# Patient Record
Sex: Female | Born: 1935
Health system: Southern US, Community
[De-identification: ages and names within clinical notes are randomized; demographics above are authoritative.]

## PROBLEM LIST (undated history)

## (undated) DIAGNOSIS — D649 Anemia, unspecified: Secondary | ICD-10-CM

## (undated) DIAGNOSIS — L309 Dermatitis, unspecified: Secondary | ICD-10-CM

## (undated) DIAGNOSIS — E785 Hyperlipidemia, unspecified: Secondary | ICD-10-CM

## (undated) DIAGNOSIS — I10 Essential (primary) hypertension: Secondary | ICD-10-CM

## (undated) DIAGNOSIS — M069 Rheumatoid arthritis, unspecified: Secondary | ICD-10-CM

## (undated) DIAGNOSIS — M1990 Unspecified osteoarthritis, unspecified site: Secondary | ICD-10-CM

## (undated) DIAGNOSIS — N289 Disorder of kidney and ureter, unspecified: Secondary | ICD-10-CM

## (undated) DIAGNOSIS — E119 Type 2 diabetes mellitus without complications: Secondary | ICD-10-CM

---

## 1998-01-20 ENCOUNTER — Other Ambulatory Visit: Admission: RE | Admit: 1998-01-20 | Discharge: 1998-01-20 | Payer: Self-pay | Admitting: Obstetrics and Gynecology

## 1998-02-03 ENCOUNTER — Other Ambulatory Visit: Admission: RE | Admit: 1998-02-03 | Discharge: 1998-02-03 | Payer: Self-pay | Admitting: Obstetrics and Gynecology

## 1999-01-22 ENCOUNTER — Other Ambulatory Visit: Admission: RE | Admit: 1999-01-22 | Discharge: 1999-01-22 | Payer: Self-pay | Admitting: Obstetrics and Gynecology

## 2000-02-12 ENCOUNTER — Other Ambulatory Visit: Admission: RE | Admit: 2000-02-12 | Discharge: 2000-02-12 | Payer: Self-pay | Admitting: Obstetrics and Gynecology

## 2001-01-20 ENCOUNTER — Emergency Department (HOSPITAL_COMMUNITY): Admission: EM | Admit: 2001-01-20 | Discharge: 2001-01-21 | Payer: Self-pay | Admitting: Emergency Medicine

## 2001-02-16 ENCOUNTER — Other Ambulatory Visit: Admission: RE | Admit: 2001-02-16 | Discharge: 2001-02-16 | Payer: Self-pay | Admitting: Obstetrics and Gynecology

## 2002-02-23 ENCOUNTER — Other Ambulatory Visit: Admission: RE | Admit: 2002-02-23 | Discharge: 2002-02-23 | Payer: Self-pay | Admitting: Obstetrics and Gynecology

## 2003-04-05 ENCOUNTER — Other Ambulatory Visit: Admission: RE | Admit: 2003-04-05 | Discharge: 2003-04-05 | Payer: Self-pay | Admitting: Obstetrics and Gynecology

## 2005-11-17 ENCOUNTER — Emergency Department (HOSPITAL_COMMUNITY): Admission: EM | Admit: 2005-11-17 | Discharge: 2005-11-17 | Payer: Self-pay | Admitting: Emergency Medicine

## 2005-11-20 ENCOUNTER — Encounter: Admission: RE | Admit: 2005-11-20 | Discharge: 2005-11-20 | Payer: Self-pay | Admitting: Internal Medicine

## 2005-12-17 ENCOUNTER — Encounter: Admission: RE | Admit: 2005-12-17 | Discharge: 2005-12-17 | Payer: Self-pay | Admitting: Interventional Radiology

## 2005-12-26 ENCOUNTER — Encounter: Admission: RE | Admit: 2005-12-26 | Discharge: 2005-12-26 | Payer: Self-pay | Admitting: Interventional Radiology

## 2006-01-16 ENCOUNTER — Encounter: Admission: RE | Admit: 2006-01-16 | Discharge: 2006-01-16 | Payer: Self-pay | Admitting: Interventional Radiology

## 2006-07-03 ENCOUNTER — Encounter: Admission: RE | Admit: 2006-07-03 | Discharge: 2006-07-03 | Payer: Self-pay | Admitting: Interventional Radiology

## 2006-12-24 ENCOUNTER — Encounter: Admission: RE | Admit: 2006-12-24 | Discharge: 2006-12-24 | Payer: Self-pay | Admitting: Interventional Radiology

## 2010-04-08 ENCOUNTER — Encounter: Payer: Self-pay | Admitting: Oncology

## 2011-04-15 DIAGNOSIS — Z961 Presence of intraocular lens: Secondary | ICD-10-CM | POA: Diagnosis not present

## 2011-04-15 DIAGNOSIS — E119 Type 2 diabetes mellitus without complications: Secondary | ICD-10-CM | POA: Diagnosis not present

## 2011-07-15 DIAGNOSIS — M1990 Unspecified osteoarthritis, unspecified site: Secondary | ICD-10-CM | POA: Diagnosis not present

## 2011-07-15 DIAGNOSIS — I10 Essential (primary) hypertension: Secondary | ICD-10-CM | POA: Diagnosis not present

## 2011-07-15 DIAGNOSIS — E785 Hyperlipidemia, unspecified: Secondary | ICD-10-CM | POA: Diagnosis not present

## 2011-07-15 DIAGNOSIS — E119 Type 2 diabetes mellitus without complications: Secondary | ICD-10-CM | POA: Diagnosis not present

## 2011-11-06 DIAGNOSIS — I10 Essential (primary) hypertension: Secondary | ICD-10-CM | POA: Diagnosis not present

## 2011-11-06 DIAGNOSIS — E119 Type 2 diabetes mellitus without complications: Secondary | ICD-10-CM | POA: Diagnosis not present

## 2011-11-06 DIAGNOSIS — M1990 Unspecified osteoarthritis, unspecified site: Secondary | ICD-10-CM | POA: Diagnosis not present

## 2011-11-06 DIAGNOSIS — E785 Hyperlipidemia, unspecified: Secondary | ICD-10-CM | POA: Diagnosis not present

## 2011-11-21 DIAGNOSIS — D3130 Benign neoplasm of unspecified choroid: Secondary | ICD-10-CM | POA: Diagnosis not present

## 2011-11-21 DIAGNOSIS — E11319 Type 2 diabetes mellitus with unspecified diabetic retinopathy without macular edema: Secondary | ICD-10-CM | POA: Diagnosis not present

## 2011-11-21 DIAGNOSIS — E1139 Type 2 diabetes mellitus with other diabetic ophthalmic complication: Secondary | ICD-10-CM | POA: Diagnosis not present

## 2012-02-25 DIAGNOSIS — I10 Essential (primary) hypertension: Secondary | ICD-10-CM | POA: Diagnosis not present

## 2012-02-25 DIAGNOSIS — R82998 Other abnormal findings in urine: Secondary | ICD-10-CM | POA: Diagnosis not present

## 2012-02-25 DIAGNOSIS — E119 Type 2 diabetes mellitus without complications: Secondary | ICD-10-CM | POA: Diagnosis not present

## 2012-02-25 DIAGNOSIS — E785 Hyperlipidemia, unspecified: Secondary | ICD-10-CM | POA: Diagnosis not present

## 2012-03-03 DIAGNOSIS — Z23 Encounter for immunization: Secondary | ICD-10-CM | POA: Diagnosis not present

## 2012-03-03 DIAGNOSIS — I10 Essential (primary) hypertension: Secondary | ICD-10-CM | POA: Diagnosis not present

## 2012-03-03 DIAGNOSIS — Z0000 Encounter for general adult medical examination without abnormal findings: Secondary | ICD-10-CM | POA: Diagnosis not present

## 2012-03-03 DIAGNOSIS — E119 Type 2 diabetes mellitus without complications: Secondary | ICD-10-CM | POA: Diagnosis not present

## 2012-03-03 DIAGNOSIS — E785 Hyperlipidemia, unspecified: Secondary | ICD-10-CM | POA: Diagnosis not present

## 2012-03-05 DIAGNOSIS — Z1212 Encounter for screening for malignant neoplasm of rectum: Secondary | ICD-10-CM | POA: Diagnosis not present

## 2012-04-15 DIAGNOSIS — E1139 Type 2 diabetes mellitus with other diabetic ophthalmic complication: Secondary | ICD-10-CM | POA: Diagnosis not present

## 2012-04-15 DIAGNOSIS — D3130 Benign neoplasm of unspecified choroid: Secondary | ICD-10-CM | POA: Diagnosis not present

## 2012-04-15 DIAGNOSIS — H179 Unspecified corneal scar and opacity: Secondary | ICD-10-CM | POA: Diagnosis not present

## 2012-04-15 DIAGNOSIS — E11319 Type 2 diabetes mellitus with unspecified diabetic retinopathy without macular edema: Secondary | ICD-10-CM | POA: Diagnosis not present

## 2012-07-16 DIAGNOSIS — I10 Essential (primary) hypertension: Secondary | ICD-10-CM | POA: Diagnosis not present

## 2012-07-16 DIAGNOSIS — Z1331 Encounter for screening for depression: Secondary | ICD-10-CM | POA: Diagnosis not present

## 2012-07-16 DIAGNOSIS — IMO0002 Reserved for concepts with insufficient information to code with codable children: Secondary | ICD-10-CM | POA: Diagnosis not present

## 2012-07-16 DIAGNOSIS — M1990 Unspecified osteoarthritis, unspecified site: Secondary | ICD-10-CM | POA: Diagnosis not present

## 2012-07-16 DIAGNOSIS — E119 Type 2 diabetes mellitus without complications: Secondary | ICD-10-CM | POA: Diagnosis not present

## 2012-07-16 DIAGNOSIS — E785 Hyperlipidemia, unspecified: Secondary | ICD-10-CM | POA: Diagnosis not present

## 2012-08-27 DIAGNOSIS — H5310 Unspecified subjective visual disturbances: Secondary | ICD-10-CM | POA: Diagnosis not present

## 2012-08-27 DIAGNOSIS — K0501 Acute gingivitis, non-plaque induced: Secondary | ICD-10-CM | POA: Diagnosis not present

## 2012-08-27 DIAGNOSIS — H2640 Unspecified secondary cataract: Secondary | ICD-10-CM | POA: Diagnosis not present

## 2012-08-27 DIAGNOSIS — G501 Atypical facial pain: Secondary | ICD-10-CM | POA: Diagnosis not present

## 2012-08-27 DIAGNOSIS — R6884 Jaw pain: Secondary | ICD-10-CM | POA: Diagnosis not present

## 2012-11-05 DIAGNOSIS — H26499 Other secondary cataract, unspecified eye: Secondary | ICD-10-CM | POA: Diagnosis not present

## 2012-11-05 DIAGNOSIS — H2640 Unspecified secondary cataract: Secondary | ICD-10-CM | POA: Diagnosis not present

## 2012-11-26 DIAGNOSIS — D3130 Benign neoplasm of unspecified choroid: Secondary | ICD-10-CM | POA: Diagnosis not present

## 2012-11-26 DIAGNOSIS — E11319 Type 2 diabetes mellitus with unspecified diabetic retinopathy without macular edema: Secondary | ICD-10-CM | POA: Diagnosis not present

## 2012-11-26 DIAGNOSIS — E1139 Type 2 diabetes mellitus with other diabetic ophthalmic complication: Secondary | ICD-10-CM | POA: Diagnosis not present

## 2012-12-04 DIAGNOSIS — I10 Essential (primary) hypertension: Secondary | ICD-10-CM | POA: Diagnosis not present

## 2012-12-04 DIAGNOSIS — D649 Anemia, unspecified: Secondary | ICD-10-CM | POA: Diagnosis not present

## 2012-12-04 DIAGNOSIS — E119 Type 2 diabetes mellitus without complications: Secondary | ICD-10-CM | POA: Diagnosis not present

## 2012-12-04 DIAGNOSIS — Z23 Encounter for immunization: Secondary | ICD-10-CM | POA: Diagnosis not present

## 2012-12-04 DIAGNOSIS — E785 Hyperlipidemia, unspecified: Secondary | ICD-10-CM | POA: Diagnosis not present

## 2012-12-04 DIAGNOSIS — M1990 Unspecified osteoarthritis, unspecified site: Secondary | ICD-10-CM | POA: Diagnosis not present

## 2012-12-04 DIAGNOSIS — IMO0002 Reserved for concepts with insufficient information to code with codable children: Secondary | ICD-10-CM | POA: Diagnosis not present

## 2013-03-02 DIAGNOSIS — D649 Anemia, unspecified: Secondary | ICD-10-CM | POA: Diagnosis not present

## 2013-03-02 DIAGNOSIS — E785 Hyperlipidemia, unspecified: Secondary | ICD-10-CM | POA: Diagnosis not present

## 2013-03-02 DIAGNOSIS — E1169 Type 2 diabetes mellitus with other specified complication: Secondary | ICD-10-CM | POA: Diagnosis not present

## 2013-03-02 DIAGNOSIS — R809 Proteinuria, unspecified: Secondary | ICD-10-CM | POA: Diagnosis not present

## 2013-03-08 DIAGNOSIS — I10 Essential (primary) hypertension: Secondary | ICD-10-CM | POA: Diagnosis not present

## 2013-03-08 DIAGNOSIS — IMO0002 Reserved for concepts with insufficient information to code with codable children: Secondary | ICD-10-CM | POA: Diagnosis not present

## 2013-03-08 DIAGNOSIS — E785 Hyperlipidemia, unspecified: Secondary | ICD-10-CM | POA: Diagnosis not present

## 2013-03-08 DIAGNOSIS — M1990 Unspecified osteoarthritis, unspecified site: Secondary | ICD-10-CM | POA: Diagnosis not present

## 2013-03-08 DIAGNOSIS — Z0000 Encounter for general adult medical examination without abnormal findings: Secondary | ICD-10-CM | POA: Diagnosis not present

## 2013-03-08 DIAGNOSIS — E119 Type 2 diabetes mellitus without complications: Secondary | ICD-10-CM | POA: Diagnosis not present

## 2013-03-19 DIAGNOSIS — Z1212 Encounter for screening for malignant neoplasm of rectum: Secondary | ICD-10-CM | POA: Diagnosis not present

## 2013-04-19 DIAGNOSIS — H02059 Trichiasis without entropian unspecified eye, unspecified eyelid: Secondary | ICD-10-CM | POA: Diagnosis not present

## 2013-04-19 DIAGNOSIS — E11319 Type 2 diabetes mellitus with unspecified diabetic retinopathy without macular edema: Secondary | ICD-10-CM | POA: Diagnosis not present

## 2013-04-19 DIAGNOSIS — E1139 Type 2 diabetes mellitus with other diabetic ophthalmic complication: Secondary | ICD-10-CM | POA: Diagnosis not present

## 2013-04-19 DIAGNOSIS — D3130 Benign neoplasm of unspecified choroid: Secondary | ICD-10-CM | POA: Diagnosis not present

## 2013-07-21 DIAGNOSIS — I10 Essential (primary) hypertension: Secondary | ICD-10-CM | POA: Diagnosis not present

## 2013-07-21 DIAGNOSIS — Z1331 Encounter for screening for depression: Secondary | ICD-10-CM | POA: Diagnosis not present

## 2013-07-21 DIAGNOSIS — E785 Hyperlipidemia, unspecified: Secondary | ICD-10-CM | POA: Diagnosis not present

## 2013-07-21 DIAGNOSIS — IMO0002 Reserved for concepts with insufficient information to code with codable children: Secondary | ICD-10-CM | POA: Diagnosis not present

## 2013-07-21 DIAGNOSIS — E119 Type 2 diabetes mellitus without complications: Secondary | ICD-10-CM | POA: Diagnosis not present

## 2013-10-25 DIAGNOSIS — E119 Type 2 diabetes mellitus without complications: Secondary | ICD-10-CM | POA: Diagnosis not present

## 2013-10-25 DIAGNOSIS — IMO0002 Reserved for concepts with insufficient information to code with codable children: Secondary | ICD-10-CM | POA: Diagnosis not present

## 2013-11-23 DIAGNOSIS — D3130 Benign neoplasm of unspecified choroid: Secondary | ICD-10-CM | POA: Diagnosis not present

## 2013-11-23 DIAGNOSIS — E1139 Type 2 diabetes mellitus with other diabetic ophthalmic complication: Secondary | ICD-10-CM | POA: Diagnosis not present

## 2014-03-14 DIAGNOSIS — E785 Hyperlipidemia, unspecified: Secondary | ICD-10-CM | POA: Diagnosis not present

## 2014-03-14 DIAGNOSIS — E119 Type 2 diabetes mellitus without complications: Secondary | ICD-10-CM | POA: Diagnosis not present

## 2014-03-14 DIAGNOSIS — R8290 Unspecified abnormal findings in urine: Secondary | ICD-10-CM | POA: Diagnosis not present

## 2014-03-14 DIAGNOSIS — R8299 Other abnormal findings in urine: Secondary | ICD-10-CM | POA: Diagnosis not present

## 2014-03-14 DIAGNOSIS — I10 Essential (primary) hypertension: Secondary | ICD-10-CM | POA: Diagnosis not present

## 2014-03-21 DIAGNOSIS — E119 Type 2 diabetes mellitus without complications: Secondary | ICD-10-CM | POA: Diagnosis not present

## 2014-03-21 DIAGNOSIS — E785 Hyperlipidemia, unspecified: Secondary | ICD-10-CM | POA: Diagnosis not present

## 2014-03-21 DIAGNOSIS — Z6822 Body mass index (BMI) 22.0-22.9, adult: Secondary | ICD-10-CM | POA: Diagnosis not present

## 2014-03-21 DIAGNOSIS — Z23 Encounter for immunization: Secondary | ICD-10-CM | POA: Diagnosis not present

## 2014-03-21 DIAGNOSIS — D649 Anemia, unspecified: Secondary | ICD-10-CM | POA: Diagnosis not present

## 2014-03-21 DIAGNOSIS — I10 Essential (primary) hypertension: Secondary | ICD-10-CM | POA: Diagnosis not present

## 2014-03-21 DIAGNOSIS — M1990 Unspecified osteoarthritis, unspecified site: Secondary | ICD-10-CM | POA: Diagnosis not present

## 2014-03-21 DIAGNOSIS — Z0000 Encounter for general adult medical examination without abnormal findings: Secondary | ICD-10-CM | POA: Diagnosis not present

## 2014-03-22 DIAGNOSIS — Z1212 Encounter for screening for malignant neoplasm of rectum: Secondary | ICD-10-CM | POA: Diagnosis not present

## 2014-04-20 DIAGNOSIS — H02052 Trichiasis without entropian right lower eyelid: Secondary | ICD-10-CM | POA: Diagnosis not present

## 2014-04-20 DIAGNOSIS — H02055 Trichiasis without entropian left lower eyelid: Secondary | ICD-10-CM | POA: Diagnosis not present

## 2014-04-20 DIAGNOSIS — E11329 Type 2 diabetes mellitus with mild nonproliferative diabetic retinopathy without macular edema: Secondary | ICD-10-CM | POA: Diagnosis not present

## 2014-04-20 DIAGNOSIS — D3131 Benign neoplasm of right choroid: Secondary | ICD-10-CM | POA: Diagnosis not present

## 2014-06-22 DIAGNOSIS — I10 Essential (primary) hypertension: Secondary | ICD-10-CM | POA: Diagnosis not present

## 2014-06-22 DIAGNOSIS — E119 Type 2 diabetes mellitus without complications: Secondary | ICD-10-CM | POA: Diagnosis not present

## 2014-07-20 DIAGNOSIS — M8588 Other specified disorders of bone density and structure, other site: Secondary | ICD-10-CM | POA: Diagnosis not present

## 2014-07-20 DIAGNOSIS — Z6822 Body mass index (BMI) 22.0-22.9, adult: Secondary | ICD-10-CM | POA: Diagnosis not present

## 2014-07-20 DIAGNOSIS — Z1231 Encounter for screening mammogram for malignant neoplasm of breast: Secondary | ICD-10-CM | POA: Diagnosis not present

## 2014-07-20 DIAGNOSIS — Z124 Encounter for screening for malignant neoplasm of cervix: Secondary | ICD-10-CM | POA: Diagnosis not present

## 2014-07-20 DIAGNOSIS — N958 Other specified menopausal and perimenopausal disorders: Secondary | ICD-10-CM | POA: Diagnosis not present

## 2014-09-30 DIAGNOSIS — M1990 Unspecified osteoarthritis, unspecified site: Secondary | ICD-10-CM | POA: Diagnosis not present

## 2014-09-30 DIAGNOSIS — Z6821 Body mass index (BMI) 21.0-21.9, adult: Secondary | ICD-10-CM | POA: Diagnosis not present

## 2014-09-30 DIAGNOSIS — Z1389 Encounter for screening for other disorder: Secondary | ICD-10-CM | POA: Diagnosis not present

## 2014-09-30 DIAGNOSIS — E119 Type 2 diabetes mellitus without complications: Secondary | ICD-10-CM | POA: Diagnosis not present

## 2014-09-30 DIAGNOSIS — D649 Anemia, unspecified: Secondary | ICD-10-CM | POA: Diagnosis not present

## 2014-09-30 DIAGNOSIS — E785 Hyperlipidemia, unspecified: Secondary | ICD-10-CM | POA: Diagnosis not present

## 2014-09-30 DIAGNOSIS — I10 Essential (primary) hypertension: Secondary | ICD-10-CM | POA: Diagnosis not present

## 2014-11-24 DIAGNOSIS — D3131 Benign neoplasm of right choroid: Secondary | ICD-10-CM | POA: Diagnosis not present

## 2014-11-24 DIAGNOSIS — E11329 Type 2 diabetes mellitus with mild nonproliferative diabetic retinopathy without macular edema: Secondary | ICD-10-CM | POA: Diagnosis not present

## 2015-01-04 DIAGNOSIS — E119 Type 2 diabetes mellitus without complications: Secondary | ICD-10-CM | POA: Diagnosis not present

## 2015-01-04 DIAGNOSIS — Z6821 Body mass index (BMI) 21.0-21.9, adult: Secondary | ICD-10-CM | POA: Diagnosis not present

## 2015-01-04 DIAGNOSIS — E785 Hyperlipidemia, unspecified: Secondary | ICD-10-CM | POA: Diagnosis not present

## 2015-01-04 DIAGNOSIS — Z23 Encounter for immunization: Secondary | ICD-10-CM | POA: Diagnosis not present

## 2015-01-04 DIAGNOSIS — I10 Essential (primary) hypertension: Secondary | ICD-10-CM | POA: Diagnosis not present

## 2015-01-04 DIAGNOSIS — D649 Anemia, unspecified: Secondary | ICD-10-CM | POA: Diagnosis not present

## 2015-01-04 DIAGNOSIS — M1990 Unspecified osteoarthritis, unspecified site: Secondary | ICD-10-CM | POA: Diagnosis not present

## 2015-03-17 DIAGNOSIS — E119 Type 2 diabetes mellitus without complications: Secondary | ICD-10-CM | POA: Diagnosis not present

## 2015-03-17 DIAGNOSIS — E784 Other hyperlipidemia: Secondary | ICD-10-CM | POA: Diagnosis not present

## 2015-03-17 DIAGNOSIS — R8290 Unspecified abnormal findings in urine: Secondary | ICD-10-CM | POA: Diagnosis not present

## 2015-03-17 DIAGNOSIS — N390 Urinary tract infection, site not specified: Secondary | ICD-10-CM | POA: Diagnosis not present

## 2015-03-31 DIAGNOSIS — E784 Other hyperlipidemia: Secondary | ICD-10-CM | POA: Diagnosis not present

## 2015-03-31 DIAGNOSIS — I129 Hypertensive chronic kidney disease with stage 1 through stage 4 chronic kidney disease, or unspecified chronic kidney disease: Secondary | ICD-10-CM | POA: Diagnosis not present

## 2015-03-31 DIAGNOSIS — Z0000 Encounter for general adult medical examination without abnormal findings: Secondary | ICD-10-CM | POA: Diagnosis not present

## 2015-03-31 DIAGNOSIS — M1990 Unspecified osteoarthritis, unspecified site: Secondary | ICD-10-CM | POA: Diagnosis not present

## 2015-03-31 DIAGNOSIS — D649 Anemia, unspecified: Secondary | ICD-10-CM | POA: Diagnosis not present

## 2015-03-31 DIAGNOSIS — Z1389 Encounter for screening for other disorder: Secondary | ICD-10-CM | POA: Diagnosis not present

## 2015-03-31 DIAGNOSIS — I10 Essential (primary) hypertension: Secondary | ICD-10-CM | POA: Diagnosis not present

## 2015-03-31 DIAGNOSIS — E119 Type 2 diabetes mellitus without complications: Secondary | ICD-10-CM | POA: Diagnosis not present

## 2015-03-31 DIAGNOSIS — L309 Dermatitis, unspecified: Secondary | ICD-10-CM | POA: Diagnosis not present

## 2015-03-31 DIAGNOSIS — N183 Chronic kidney disease, stage 3 (moderate): Secondary | ICD-10-CM | POA: Diagnosis not present

## 2015-03-31 DIAGNOSIS — Z6822 Body mass index (BMI) 22.0-22.9, adult: Secondary | ICD-10-CM | POA: Diagnosis not present

## 2015-04-26 DIAGNOSIS — H02055 Trichiasis without entropian left lower eyelid: Secondary | ICD-10-CM | POA: Diagnosis not present

## 2015-04-26 DIAGNOSIS — E113293 Type 2 diabetes mellitus with mild nonproliferative diabetic retinopathy without macular edema, bilateral: Secondary | ICD-10-CM | POA: Diagnosis not present

## 2015-04-26 DIAGNOSIS — D3131 Benign neoplasm of right choroid: Secondary | ICD-10-CM | POA: Diagnosis not present

## 2015-04-26 DIAGNOSIS — H02052 Trichiasis without entropian right lower eyelid: Secondary | ICD-10-CM | POA: Diagnosis not present

## 2015-08-24 DIAGNOSIS — L089 Local infection of the skin and subcutaneous tissue, unspecified: Secondary | ICD-10-CM | POA: Diagnosis not present

## 2015-08-24 DIAGNOSIS — M25511 Pain in right shoulder: Secondary | ICD-10-CM | POA: Diagnosis not present

## 2015-08-24 DIAGNOSIS — M25512 Pain in left shoulder: Secondary | ICD-10-CM | POA: Diagnosis not present

## 2015-08-24 DIAGNOSIS — E119 Type 2 diabetes mellitus without complications: Secondary | ICD-10-CM | POA: Diagnosis not present

## 2015-08-24 DIAGNOSIS — Z6822 Body mass index (BMI) 22.0-22.9, adult: Secondary | ICD-10-CM | POA: Diagnosis not present

## 2015-08-24 DIAGNOSIS — M859 Disorder of bone density and structure, unspecified: Secondary | ICD-10-CM | POA: Diagnosis not present

## 2015-09-01 DIAGNOSIS — I10 Essential (primary) hypertension: Secondary | ICD-10-CM | POA: Diagnosis not present

## 2015-09-01 DIAGNOSIS — Z6822 Body mass index (BMI) 22.0-22.9, adult: Secondary | ICD-10-CM | POA: Diagnosis not present

## 2015-09-01 DIAGNOSIS — M353 Polymyalgia rheumatica: Secondary | ICD-10-CM | POA: Diagnosis not present

## 2015-09-01 DIAGNOSIS — M25519 Pain in unspecified shoulder: Secondary | ICD-10-CM | POA: Diagnosis not present

## 2015-09-28 DIAGNOSIS — M25519 Pain in unspecified shoulder: Secondary | ICD-10-CM | POA: Diagnosis not present

## 2015-09-28 DIAGNOSIS — D649 Anemia, unspecified: Secondary | ICD-10-CM | POA: Diagnosis not present

## 2015-09-28 DIAGNOSIS — M1990 Unspecified osteoarthritis, unspecified site: Secondary | ICD-10-CM | POA: Diagnosis not present

## 2015-09-28 DIAGNOSIS — I129 Hypertensive chronic kidney disease with stage 1 through stage 4 chronic kidney disease, or unspecified chronic kidney disease: Secondary | ICD-10-CM | POA: Diagnosis not present

## 2015-09-28 DIAGNOSIS — M353 Polymyalgia rheumatica: Secondary | ICD-10-CM | POA: Diagnosis not present

## 2015-09-28 DIAGNOSIS — E119 Type 2 diabetes mellitus without complications: Secondary | ICD-10-CM | POA: Diagnosis not present

## 2015-09-28 DIAGNOSIS — I10 Essential (primary) hypertension: Secondary | ICD-10-CM | POA: Diagnosis not present

## 2015-09-28 DIAGNOSIS — L309 Dermatitis, unspecified: Secondary | ICD-10-CM | POA: Diagnosis not present

## 2015-09-28 DIAGNOSIS — E784 Other hyperlipidemia: Secondary | ICD-10-CM | POA: Diagnosis not present

## 2015-09-28 DIAGNOSIS — M859 Disorder of bone density and structure, unspecified: Secondary | ICD-10-CM | POA: Diagnosis not present

## 2015-09-28 DIAGNOSIS — Z6823 Body mass index (BMI) 23.0-23.9, adult: Secondary | ICD-10-CM | POA: Diagnosis not present

## 2015-09-28 DIAGNOSIS — N183 Chronic kidney disease, stage 3 (moderate): Secondary | ICD-10-CM | POA: Diagnosis not present

## 2015-10-22 ENCOUNTER — Encounter (HOSPITAL_COMMUNITY): Payer: Self-pay | Admitting: Emergency Medicine

## 2015-10-22 ENCOUNTER — Ambulatory Visit (HOSPITAL_COMMUNITY)
Admission: EM | Admit: 2015-10-22 | Discharge: 2015-10-22 | Disposition: A | Discharge status: Home / Self Care | Payer: Medicare Other | Attending: Emergency Medicine | Admitting: Emergency Medicine

## 2015-10-22 DIAGNOSIS — S0083XA Contusion of other part of head, initial encounter: Secondary | ICD-10-CM

## 2015-10-22 DIAGNOSIS — T148XXA Other injury of unspecified body region, initial encounter: Secondary | ICD-10-CM

## 2015-10-22 DIAGNOSIS — S01111A Laceration without foreign body of right eyelid and periocular area, initial encounter: Secondary | ICD-10-CM

## 2015-10-22 DIAGNOSIS — T148 Other injury of unspecified body region: Secondary | ICD-10-CM

## 2015-10-22 DIAGNOSIS — S0012XA Contusion of left eyelid and periocular area, initial encounter: Secondary | ICD-10-CM

## 2015-10-22 HISTORY — DX: Essential (primary) hypertension: I10

## 2015-10-22 HISTORY — DX: Type 2 diabetes mellitus without complications: E11.9

## 2015-10-22 HISTORY — DX: Disorder of kidney and ureter, unspecified: N28.9

## 2015-10-22 HISTORY — DX: Dermatitis, unspecified: L30.9

## 2015-10-22 HISTORY — DX: Anemia, unspecified: D64.9

## 2015-10-22 HISTORY — DX: Hyperlipidemia, unspecified: E78.5

## 2015-10-22 HISTORY — DX: Unspecified osteoarthritis, unspecified site: M19.90

## 2015-10-22 NOTE — ED Triage Notes (Signed)
The patient presented to the [facility] with a complaint of a laceration above her right eye, left arm pain and right knee pain secondary to a fall that occurred today. The patient stated that she tripped and fell in her bedroom. She denied any LOC.

## 2015-10-22 NOTE — ED Provider Notes (Signed)
CSN: [id number]     Arrival date & time 10/22/15  1259 History   First MD Initiated Contact with Patient 10/22/15 1405     Chief Complaint  Patient presents with  . Fall   (Consider location/radiation/quality/duration/timing/severity/associated sxs/prior Treatment) HPI 80 y/o female was trying to move her cat when she slipped wearing her bedroom slippers on a hard wood floor. She struck the floor, sustaining a laceration above her right eyebrow. bleeding was controlled with pressure. There was no LOC,  Past Medical History:  Diagnosis Date  . Anemia   . Diabetes mellitus without complication ([place])   . Eczema   . Hyperlipidemia   . Hypertension   . Osteoarthritis   . Renal disorder    History reviewed. No pertinent surgical history. History reviewed. No pertinent family history. Social History  Substance Use Topics  . Smoking status: Never Smoker  . Smokeless tobacco: Never Used  . Alcohol use No   OB History    No data available     Review of Systems  Denies: HEADACHE, NAUSEA, ABDOMINAL PAIN, CHEST PAIN, CONGESTION, DYSURIA, SHORTNESS OF BREATH  Allergies  Review of patient's allergies indicates no known allergies.  Home Medications   Prior to Admission medications   Medication Sig Start Date End Date Taking? Authorizing Provider  atorvastatin (LIPITOR) 20 MG tablet Take 20 mg by mouth daily.   Yes Historical Provider, MD  cholecalciferol (VITAMIN D) 1000 units tablet Take 1,000 Units by mouth daily.   Yes Historical Provider, MD  glyBURIDE-metformin (GLUCOVANCE) 2.5-500 MG tablet Take 1 tablet by mouth daily with breakfast.   Yes Historical Provider, MD  lisinopril (PRINIVIL,ZESTRIL) 10 MG tablet Take 10 mg by mouth daily.   Yes Historical Provider, MD  Multiple Vitamin (MULTIVITAMIN) capsule Take 1 capsule by mouth daily.   Yes Historical Provider, MD  naproxen (NAPROSYN) 250 MG tablet Take by mouth 2 (two) times daily with a meal.   Yes Historical Provider, MD    Meds Ordered and Administered this Visit  Medications - No data to display  BP 146/59 (BP Location: Right Arm)   Pulse 91   Temp 98.3 F (36.8 C) (Oral)   SpO2 94%  No data found.   Physical Exam NURSES NOTES AND VITAL SIGNS REVIEWED. CONSTITUTIONAL: Well developed, well nourished, no acute distress HEENT: normocephalic, atraumatic EYES: Conjunctiva normal contusion over right eye and small laceration on the eyebrow.  NECK:normal ROM, supple, no adenopathy PULMONARY:No respiratory distress, normal effort ABDOMINAL: Soft, ND, NT BS+, No CVAT MUSCULOSKELETAL: Normal ROM of all extremities,  SKIN: warm and dry without rash PSYCHIATRIC: Mood and affect, behavior are normal  Urgent Care Course   Clinical Course    .Marland KitchenLaceration Repair Date/Time: 10/22/2015 2:15 PM Performed by: Konrad Felix Authorized by: Melony Overly   Consent:    Consent obtained:  Verbal   Consent given by:  Patient Anesthesia (see MAR for exact dosages):    Anesthesia method:  None Laceration details:    Location:  Face   Face location:  R eyebrow   Length (cm):  1 Repair type:    Repair type:  Simple Treatment:    Area cleansed with:  Shur-Clens   Amount of cleaning:  Standard   Visualized foreign bodies/material removed: no   Approximation:    Approximation:  Close   Vermilion border: well-aligned   Post-procedure details:    Dressing:  Non-adherent dressing   Patient tolerance of procedure:  Tolerated well, no immediate complications     (  including critical care time)  Labs Review Labs Reviewed - No data to display  Imaging Review No results found.   Visual Acuity Review  Right Eye Distance:   Left Eye Distance:   Bilateral Distance:    Right Eye Near:   Left Eye Near:    Bilateral Near:         MDM   1. Eyebrow laceration, right, initial encounter   2. Contusion   3. Contusion of face, initial encounter   4. Black eye, left, initial encounter     Patient  is reassured that there are no issues that require transfer to higher level of care at this time or additional tests. Patient is advised to continue home symptomatic treatment. Patient is advised that if there are new or worsening symptoms to attend the emergency department, contact primary care provider, or return to UC. Instructions of care provided discharged home in stable condition.    THIS NOTE WAS GENERATED USING A VOICE RECOGNITION SOFTWARE PROGRAM. ALL REASONABLE EFFORTS  WERE MADE TO PROOFREAD THIS DOCUMENT FOR ACCURACY.  I have verbally reviewed the discharge instructions with the patient. A printed AVS was given to the patient.  All questions were answered prior to discharge.      Konrad Felix, PA 10/22/15 2102

## 2015-10-23 DIAGNOSIS — M2550 Pain in unspecified joint: Secondary | ICD-10-CM | POA: Diagnosis not present

## 2015-10-23 DIAGNOSIS — M25511 Pain in right shoulder: Secondary | ICD-10-CM | POA: Diagnosis not present

## 2015-10-23 DIAGNOSIS — M25512 Pain in left shoulder: Secondary | ICD-10-CM | POA: Diagnosis not present

## 2015-10-23 DIAGNOSIS — R5383 Other fatigue: Secondary | ICD-10-CM | POA: Diagnosis not present

## 2015-10-23 DIAGNOSIS — R700 Elevated erythrocyte sedimentation rate: Secondary | ICD-10-CM | POA: Diagnosis not present

## 2015-10-26 DIAGNOSIS — M353 Polymyalgia rheumatica: Secondary | ICD-10-CM | POA: Diagnosis not present

## 2015-10-26 DIAGNOSIS — Z6822 Body mass index (BMI) 22.0-22.9, adult: Secondary | ICD-10-CM | POA: Diagnosis not present

## 2015-10-26 DIAGNOSIS — E119 Type 2 diabetes mellitus without complications: Secondary | ICD-10-CM | POA: Diagnosis not present

## 2015-10-26 DIAGNOSIS — R600 Localized edema: Secondary | ICD-10-CM | POA: Diagnosis not present

## 2015-10-26 DIAGNOSIS — R2681 Unsteadiness on feet: Secondary | ICD-10-CM | POA: Diagnosis not present

## 2015-10-30 DIAGNOSIS — Z6822 Body mass index (BMI) 22.0-22.9, adult: Secondary | ICD-10-CM | POA: Diagnosis not present

## 2015-10-30 DIAGNOSIS — D6489 Other specified anemias: Secondary | ICD-10-CM | POA: Diagnosis not present

## 2015-10-30 DIAGNOSIS — R5383 Other fatigue: Secondary | ICD-10-CM | POA: Diagnosis not present

## 2015-10-30 DIAGNOSIS — E119 Type 2 diabetes mellitus without complications: Secondary | ICD-10-CM | POA: Diagnosis not present

## 2015-10-30 DIAGNOSIS — I10 Essential (primary) hypertension: Secondary | ICD-10-CM | POA: Diagnosis not present

## 2015-11-03 ENCOUNTER — Encounter (HOSPITAL_COMMUNITY): Payer: Self-pay | Admitting: *Deleted

## 2015-11-03 ENCOUNTER — Ambulatory Visit (HOSPITAL_COMMUNITY)
Admission: EM | Admit: 2015-11-03 | Discharge: 2015-11-03 | Disposition: A | Discharge status: Home / Self Care | Payer: Medicare Other | Attending: Internal Medicine | Admitting: Internal Medicine

## 2015-11-03 DIAGNOSIS — R739 Hyperglycemia, unspecified: Secondary | ICD-10-CM

## 2015-11-03 DIAGNOSIS — L03119 Cellulitis of unspecified part of limb: Secondary | ICD-10-CM | POA: Diagnosis not present

## 2015-11-03 LAB — POCT I-STAT, CHEM 8
BUN: 23 mg/dL — AB (ref 6–20)
CALCIUM ION: 1.28 mmol/L — AB (ref 1.12–1.23)
Chloride: 101 mmol/L (ref 101–111)
Creatinine, Ser: 0.9 mg/dL (ref 0.44–1.00)
Glucose, Bld: 251 mg/dL — ABNORMAL HIGH (ref 65–99)
HEMATOCRIT: 26 % — AB (ref 36.0–46.0)
HEMOGLOBIN: 8.8 g/dL — AB (ref 12.0–15.0)
Potassium: 5.5 mmol/L — ABNORMAL HIGH (ref 3.5–5.1)
SODIUM: 137 mmol/L (ref 135–145)
TCO2: 26 mmol/L (ref 0–100)

## 2015-11-03 MED ORDER — CEPHALEXIN 500 MG PO CAPS: 500.0000 mg | ORAL_CAPSULE | Freq: Three times a day (TID) | ORAL | 0 refills | Status: AC

## 2015-11-03 NOTE — ED Triage Notes (Signed)
Pt  Was   Seen  9  Days  Ago  For  A  Fall  She  Has  Steri  Strips in place    She is  A  Diabetic  And  Her  Blood sugars  Have  Been up and  Down    She  States  She has   Swelling  In lower  Extremities  From   Previous  Prednisone     Treatment  She  Also  Has  Some  Redness  And  Swelling of  l  Arm    She  Has  Steri  Strips in place  Over r  Eye with  Healing ecchymosis      Present

## 2015-11-03 NOTE — ED Provider Notes (Signed)
CSN: [id number]     Arrival date & time 11/03/15  1503 History   First MD Initiated Contact with Patient 11/03/15 1554     Chief Complaint  Patient presents with  . Follow-up   (Consider location/radiation/quality/duration/timing/severity/associated sxs/prior Treatment) HPI  Heidi Moran is a 80 y.o. female presenting to UC with c/o redness, pain, and stiffness in her Left wrist that started about 2 days ago.  Pt states pain in Left hand and wrist is so bad she cannot grip her coffee cup.  She has tried extra strength tylenol with minimal relief.  She recently saw a rheumatologist but states she is still waiting to f/u at the end of the month as tests are pending.  She has been on prednisone in the past for bilateral shoulder pain and stiffness but notes her blood sugar got into the 300s so she needed to stop the prednisone and was started on insulin.  She is currently keeping tract of her daily sugars and will f/u with PCP next week to review as PCP has gradually lowered her insulin dose, however, Left wrist pain, redness and swelling is too severe to wait until next week. Pain is 8/10.  She is Right hand dominant. She questions if pricking her fingers on Left hand for blood sugar monitoring started the infection.  She does not always remember to use alcohol swabs before pricking her finger.  Denies fever, chills, n/v/d.   She was seen in UC about 9 days ago for trip and fall with laceration and contusion to her face. She notes that is doing well and gradually improving. No falls since then.   Past Medical History:  Diagnosis Date  . Anemia   . Diabetes mellitus without complication ([place])   . Eczema   . Hyperlipidemia   . Hypertension   . Osteoarthritis   . Renal disorder    History reviewed. No pertinent surgical history. History reviewed. No pertinent family history. Social History  Substance Use Topics  . Smoking status: Never Smoker  . Smokeless tobacco: Never Used  . Alcohol use  No   OB History    No data available     Review of Systems  Constitutional: Negative for chills and fever.  Musculoskeletal: Positive for arthralgias, joint swelling and myalgias.       Left wrist and hand pain and stiffness   Skin: Positive for color change and rash. Negative for wound.    Allergies  Review of patient's allergies indicates no known allergies.  Home Medications   Prior to Admission medications   Medication Sig Start Date End Date Taking? Authorizing Provider  atorvastatin (LIPITOR) 20 MG tablet Take 20 mg by mouth daily.    Historical Provider, MD  cephALEXin (KEFLEX) 500 MG capsule Take 1 capsule (500 mg total) by mouth 3 (three) times daily. 11/03/15 11/10/15  Noland Fordyce, PA-C  cholecalciferol (VITAMIN D) 1000 units tablet Take 1,000 Units by mouth daily.    Historical Provider, MD  glyBURIDE-metformin (GLUCOVANCE) 2.5-500 MG tablet Take 1 tablet by mouth daily with breakfast.    Historical Provider, MD  lisinopril (PRINIVIL,ZESTRIL) 10 MG tablet Take 10 mg by mouth daily.    Historical Provider, MD  Multiple Vitamin (MULTIVITAMIN) capsule Take 1 capsule by mouth daily.    Historical Provider, MD  naproxen (NAPROSYN) 250 MG tablet Take by mouth 2 (two) times daily with a meal.    Historical Provider, MD   Meds Ordered and Administered this Visit  Medications -  No data to display  BP 122/70 (BP Location: Left Arm)   Pulse 80   Temp 98.6 F (37 C) (Oral)   SpO2 97%  No data found.   Physical Exam  Constitutional: She is oriented to person, place, and time. She appears well-developed and well-nourished.  HENT:  Head: Normocephalic. Head is with contusion.    Well healing laceration above Right eyebrow. Steri-strip in place. Diffuse ecchymosis.   Eyes: EOM are normal.  Cardiovascular: Normal rate.   Pulses:      Radial pulses are 2+ on the left side.  Pulmonary/Chest: Effort normal.  Musculoskeletal: Normal range of motion. She exhibits tenderness.  She exhibits no edema.  Left hand: limited ROM fingers, unable to make a full fist due to stiffness and pain. Mild tenderness to dorsal lateral aspect.  Full ROM left wrist.   Neurological: She is alert and oriented to person, place, and time.  Skin: Skin is warm and dry. Capillary refill takes less than 2 seconds. There is erythema.  Left wrist: skin in tact, faint erythema to dorsal and ulnar aspect.   Psychiatric: She has a normal mood and affect. Her behavior is normal.  Nursing note and vitals reviewed.   Urgent Care Course   Clinical Course    Procedures (including critical care time)  Labs Review Labs Reviewed  POCT I-STAT, CHEM 8 - Abnormal; Notable for the following:       Result Value   Potassium 5.5 (*)    BUN 23 (*)    Glucose, Bld 251 (*)    Calcium, Ion 1.28 (*)    Hemoglobin 8.8 (*)    HCT 26.0 (*)    All other components within normal limits    Imaging Review No results found.   MDM   1. Hyperglycemia   2. Cellulitis of wrist    Pt c/o worsening Left wrist pain, redness, and stiffness. Currently being worked up by a rheumatologist, f/u visit with them later this month.  Erythema and reported worsening pain concerning for underlying cellulitis.  Rx: Keflex and warm compresses.  Glucose- 251, pt reportedly have glucose of 62 this morning, she has since has 2 english muffins, Ocean Spr[employer] juice and peanut butter sandwhich. Pt's PCP working with pt to help manage her glucose with insulin.  Glucose could be elevated to mild cellulitis in Left wrist.  Advised pt to continue to monitor her CBG at home.  Pt info for Type 2 Diabetes and Hyperglycemia provided.  Resources for [facility] and Diabetes Education management center provided. Encouraged to call Monday to request assistance with her DM. Advised to f/u with PCP Monday or Tuesday next week. If symptoms worsen this evening or this morning, call 911 or go to closest emergency department or if CBG  goes above 300 or below 60.  Patient and husband verbalized understanding and agreement with treatment plan.      Noland Fordyce, PA-C 11/03/15 1719

## 2015-11-08 DIAGNOSIS — M353 Polymyalgia rheumatica: Secondary | ICD-10-CM | POA: Diagnosis not present

## 2015-11-08 DIAGNOSIS — L309 Dermatitis, unspecified: Secondary | ICD-10-CM | POA: Diagnosis not present

## 2015-11-08 DIAGNOSIS — I129 Hypertensive chronic kidney disease with stage 1 through stage 4 chronic kidney disease, or unspecified chronic kidney disease: Secondary | ICD-10-CM | POA: Diagnosis not present

## 2015-11-08 DIAGNOSIS — D649 Anemia, unspecified: Secondary | ICD-10-CM | POA: Diagnosis not present

## 2015-11-08 DIAGNOSIS — N183 Chronic kidney disease, stage 3 (moderate): Secondary | ICD-10-CM | POA: Diagnosis not present

## 2015-11-08 DIAGNOSIS — R2681 Unsteadiness on feet: Secondary | ICD-10-CM | POA: Diagnosis not present

## 2015-11-08 DIAGNOSIS — I10 Essential (primary) hypertension: Secondary | ICD-10-CM | POA: Diagnosis not present

## 2015-11-08 DIAGNOSIS — M8580 Other specified disorders of bone density and structure, unspecified site: Secondary | ICD-10-CM | POA: Diagnosis not present

## 2015-11-08 DIAGNOSIS — R5383 Other fatigue: Secondary | ICD-10-CM | POA: Diagnosis not present

## 2015-11-08 DIAGNOSIS — L089 Local infection of the skin and subcutaneous tissue, unspecified: Secondary | ICD-10-CM | POA: Diagnosis not present

## 2015-11-08 DIAGNOSIS — M25511 Pain in right shoulder: Secondary | ICD-10-CM | POA: Diagnosis not present

## 2015-11-08 DIAGNOSIS — E119 Type 2 diabetes mellitus without complications: Secondary | ICD-10-CM | POA: Diagnosis not present

## 2015-11-14 DIAGNOSIS — M0579 Rheumatoid arthritis with rheumatoid factor of multiple sites without organ or systems involvement: Secondary | ICD-10-CM | POA: Diagnosis not present

## 2015-11-14 DIAGNOSIS — M2550 Pain in unspecified joint: Secondary | ICD-10-CM | POA: Diagnosis not present

## 2015-11-28 DIAGNOSIS — E113292 Type 2 diabetes mellitus with mild nonproliferative diabetic retinopathy without macular edema, left eye: Secondary | ICD-10-CM | POA: Diagnosis not present

## 2015-11-28 DIAGNOSIS — E113291 Type 2 diabetes mellitus with mild nonproliferative diabetic retinopathy without macular edema, right eye: Secondary | ICD-10-CM | POA: Diagnosis not present

## 2015-12-05 DIAGNOSIS — M0579 Rheumatoid arthritis with rheumatoid factor of multiple sites without organ or systems involvement: Secondary | ICD-10-CM | POA: Diagnosis not present

## 2015-12-06 DIAGNOSIS — I10 Essential (primary) hypertension: Secondary | ICD-10-CM | POA: Diagnosis not present

## 2015-12-06 DIAGNOSIS — Z6821 Body mass index (BMI) 21.0-21.9, adult: Secondary | ICD-10-CM | POA: Diagnosis not present

## 2015-12-06 DIAGNOSIS — R2681 Unsteadiness on feet: Secondary | ICD-10-CM | POA: Diagnosis not present

## 2015-12-06 DIAGNOSIS — Z23 Encounter for immunization: Secondary | ICD-10-CM | POA: Diagnosis not present

## 2015-12-06 DIAGNOSIS — M353 Polymyalgia rheumatica: Secondary | ICD-10-CM | POA: Diagnosis not present

## 2015-12-06 DIAGNOSIS — M859 Disorder of bone density and structure, unspecified: Secondary | ICD-10-CM | POA: Diagnosis not present

## 2015-12-06 DIAGNOSIS — E784 Other hyperlipidemia: Secondary | ICD-10-CM | POA: Diagnosis not present

## 2015-12-06 DIAGNOSIS — L309 Dermatitis, unspecified: Secondary | ICD-10-CM | POA: Diagnosis not present

## 2015-12-06 DIAGNOSIS — I129 Hypertensive chronic kidney disease with stage 1 through stage 4 chronic kidney disease, or unspecified chronic kidney disease: Secondary | ICD-10-CM | POA: Diagnosis not present

## 2015-12-06 DIAGNOSIS — N183 Chronic kidney disease, stage 3 (moderate): Secondary | ICD-10-CM | POA: Diagnosis not present

## 2015-12-06 DIAGNOSIS — E11319 Type 2 diabetes mellitus with unspecified diabetic retinopathy without macular edema: Secondary | ICD-10-CM | POA: Diagnosis not present

## 2016-01-02 DIAGNOSIS — M0579 Rheumatoid arthritis with rheumatoid factor of multiple sites without organ or systems involvement: Secondary | ICD-10-CM | POA: Diagnosis not present

## 2016-01-16 DIAGNOSIS — M0579 Rheumatoid arthritis with rheumatoid factor of multiple sites without organ or systems involvement: Secondary | ICD-10-CM | POA: Diagnosis not present

## 2016-01-16 DIAGNOSIS — M2550 Pain in unspecified joint: Secondary | ICD-10-CM | POA: Diagnosis not present

## 2016-01-16 DIAGNOSIS — Z79899 Other long term (current) drug therapy: Secondary | ICD-10-CM | POA: Diagnosis not present

## 2016-02-27 DIAGNOSIS — M0579 Rheumatoid arthritis with rheumatoid factor of multiple sites without organ or systems involvement: Secondary | ICD-10-CM | POA: Diagnosis not present

## 2016-03-25 DIAGNOSIS — M859 Disorder of bone density and structure, unspecified: Secondary | ICD-10-CM | POA: Diagnosis not present

## 2016-03-25 DIAGNOSIS — I10 Essential (primary) hypertension: Secondary | ICD-10-CM | POA: Diagnosis not present

## 2016-03-25 DIAGNOSIS — E784 Other hyperlipidemia: Secondary | ICD-10-CM | POA: Diagnosis not present

## 2016-03-25 DIAGNOSIS — E119 Type 2 diabetes mellitus without complications: Secondary | ICD-10-CM | POA: Diagnosis not present

## 2016-04-11 DIAGNOSIS — I129 Hypertensive chronic kidney disease with stage 1 through stage 4 chronic kidney disease, or unspecified chronic kidney disease: Secondary | ICD-10-CM | POA: Diagnosis not present

## 2016-04-11 DIAGNOSIS — E119 Type 2 diabetes mellitus without complications: Secondary | ICD-10-CM | POA: Diagnosis not present

## 2016-04-11 DIAGNOSIS — E11319 Type 2 diabetes mellitus with unspecified diabetic retinopathy without macular edema: Secondary | ICD-10-CM | POA: Diagnosis not present

## 2016-04-11 DIAGNOSIS — M353 Polymyalgia rheumatica: Secondary | ICD-10-CM | POA: Diagnosis not present

## 2016-04-11 DIAGNOSIS — E1129 Type 2 diabetes mellitus with other diabetic kidney complication: Secondary | ICD-10-CM | POA: Diagnosis not present

## 2016-04-11 DIAGNOSIS — Z0000 Encounter for general adult medical examination without abnormal findings: Secondary | ICD-10-CM | POA: Diagnosis not present

## 2016-04-11 DIAGNOSIS — I10 Essential (primary) hypertension: Secondary | ICD-10-CM | POA: Diagnosis not present

## 2016-04-11 DIAGNOSIS — Z6822 Body mass index (BMI) 22.0-22.9, adult: Secondary | ICD-10-CM | POA: Diagnosis not present

## 2016-04-11 DIAGNOSIS — R2681 Unsteadiness on feet: Secondary | ICD-10-CM | POA: Diagnosis not present

## 2016-04-11 DIAGNOSIS — Z1389 Encounter for screening for other disorder: Secondary | ICD-10-CM | POA: Diagnosis not present

## 2016-04-11 DIAGNOSIS — M859 Disorder of bone density and structure, unspecified: Secondary | ICD-10-CM | POA: Diagnosis not present

## 2016-04-11 DIAGNOSIS — N183 Chronic kidney disease, stage 3 (moderate): Secondary | ICD-10-CM | POA: Diagnosis not present

## 2016-04-16 DIAGNOSIS — Z6823 Body mass index (BMI) 23.0-23.9, adult: Secondary | ICD-10-CM | POA: Diagnosis not present

## 2016-04-16 DIAGNOSIS — M0579 Rheumatoid arthritis with rheumatoid factor of multiple sites without organ or systems involvement: Secondary | ICD-10-CM | POA: Diagnosis not present

## 2016-04-16 DIAGNOSIS — Z79899 Other long term (current) drug therapy: Secondary | ICD-10-CM | POA: Diagnosis not present

## 2016-04-16 DIAGNOSIS — M2550 Pain in unspecified joint: Secondary | ICD-10-CM | POA: Diagnosis not present

## 2016-04-23 DIAGNOSIS — M0579 Rheumatoid arthritis with rheumatoid factor of multiple sites without organ or systems involvement: Secondary | ICD-10-CM | POA: Diagnosis not present

## 2016-04-26 DIAGNOSIS — E113293 Type 2 diabetes mellitus with mild nonproliferative diabetic retinopathy without macular edema, bilateral: Secondary | ICD-10-CM | POA: Diagnosis not present

## 2016-04-26 DIAGNOSIS — H02055 Trichiasis without entropian left lower eyelid: Secondary | ICD-10-CM | POA: Diagnosis not present

## 2016-04-26 DIAGNOSIS — H5212 Myopia, left eye: Secondary | ICD-10-CM | POA: Diagnosis not present

## 2016-04-26 DIAGNOSIS — H02052 Trichiasis without entropian right lower eyelid: Secondary | ICD-10-CM | POA: Diagnosis not present

## 2016-05-01 DIAGNOSIS — Z1212 Encounter for screening for malignant neoplasm of rectum: Secondary | ICD-10-CM | POA: Diagnosis not present

## 2016-06-24 DIAGNOSIS — Z79899 Other long term (current) drug therapy: Secondary | ICD-10-CM | POA: Diagnosis not present

## 2016-06-24 DIAGNOSIS — M0579 Rheumatoid arthritis with rheumatoid factor of multiple sites without organ or systems involvement: Secondary | ICD-10-CM | POA: Diagnosis not present

## 2016-08-14 DIAGNOSIS — M2550 Pain in unspecified joint: Secondary | ICD-10-CM | POA: Diagnosis not present

## 2016-08-14 DIAGNOSIS — Z6823 Body mass index (BMI) 23.0-23.9, adult: Secondary | ICD-10-CM | POA: Diagnosis not present

## 2016-08-14 DIAGNOSIS — Z79899 Other long term (current) drug therapy: Secondary | ICD-10-CM | POA: Diagnosis not present

## 2016-08-14 DIAGNOSIS — M0579 Rheumatoid arthritis with rheumatoid factor of multiple sites without organ or systems involvement: Secondary | ICD-10-CM | POA: Diagnosis not present

## 2016-08-19 DIAGNOSIS — M0579 Rheumatoid arthritis with rheumatoid factor of multiple sites without organ or systems involvement: Secondary | ICD-10-CM | POA: Diagnosis not present

## 2016-10-14 DIAGNOSIS — M0579 Rheumatoid arthritis with rheumatoid factor of multiple sites without organ or systems involvement: Secondary | ICD-10-CM | POA: Diagnosis not present

## 2016-10-28 DIAGNOSIS — E784 Other hyperlipidemia: Secondary | ICD-10-CM | POA: Diagnosis not present

## 2016-10-28 DIAGNOSIS — M859 Disorder of bone density and structure, unspecified: Secondary | ICD-10-CM | POA: Diagnosis not present

## 2016-10-28 DIAGNOSIS — E11319 Type 2 diabetes mellitus with unspecified diabetic retinopathy without macular edema: Secondary | ICD-10-CM | POA: Diagnosis not present

## 2016-10-28 DIAGNOSIS — M069 Rheumatoid arthritis, unspecified: Secondary | ICD-10-CM | POA: Diagnosis not present

## 2016-10-28 DIAGNOSIS — Z1389 Encounter for screening for other disorder: Secondary | ICD-10-CM | POA: Diagnosis not present

## 2016-10-28 DIAGNOSIS — E1129 Type 2 diabetes mellitus with other diabetic kidney complication: Secondary | ICD-10-CM | POA: Diagnosis not present

## 2016-10-28 DIAGNOSIS — I10 Essential (primary) hypertension: Secondary | ICD-10-CM | POA: Diagnosis not present

## 2016-10-28 DIAGNOSIS — M353 Polymyalgia rheumatica: Secondary | ICD-10-CM | POA: Diagnosis not present

## 2016-10-28 DIAGNOSIS — N183 Chronic kidney disease, stage 3 (moderate): Secondary | ICD-10-CM | POA: Diagnosis not present

## 2016-10-28 DIAGNOSIS — I129 Hypertensive chronic kidney disease with stage 1 through stage 4 chronic kidney disease, or unspecified chronic kidney disease: Secondary | ICD-10-CM | POA: Diagnosis not present

## 2016-10-28 DIAGNOSIS — Z6823 Body mass index (BMI) 23.0-23.9, adult: Secondary | ICD-10-CM | POA: Diagnosis not present

## 2016-10-28 DIAGNOSIS — R2681 Unsteadiness on feet: Secondary | ICD-10-CM | POA: Diagnosis not present

## 2016-11-27 DIAGNOSIS — E113293 Type 2 diabetes mellitus with mild nonproliferative diabetic retinopathy without macular edema, bilateral: Secondary | ICD-10-CM | POA: Diagnosis not present

## 2016-11-27 DIAGNOSIS — H43813 Vitreous degeneration, bilateral: Secondary | ICD-10-CM | POA: Diagnosis not present

## 2016-11-27 DIAGNOSIS — D3131 Benign neoplasm of right choroid: Secondary | ICD-10-CM | POA: Diagnosis not present

## 2016-12-09 DIAGNOSIS — M0579 Rheumatoid arthritis with rheumatoid factor of multiple sites without organ or systems involvement: Secondary | ICD-10-CM | POA: Diagnosis not present

## 2016-12-12 DIAGNOSIS — M2550 Pain in unspecified joint: Secondary | ICD-10-CM | POA: Diagnosis not present

## 2016-12-12 DIAGNOSIS — Z6823 Body mass index (BMI) 23.0-23.9, adult: Secondary | ICD-10-CM | POA: Diagnosis not present

## 2016-12-12 DIAGNOSIS — Z79899 Other long term (current) drug therapy: Secondary | ICD-10-CM | POA: Diagnosis not present

## 2016-12-12 DIAGNOSIS — M0579 Rheumatoid arthritis with rheumatoid factor of multiple sites without organ or systems involvement: Secondary | ICD-10-CM | POA: Diagnosis not present

## 2016-12-14 LAB — GLUCOSE, POCT (MANUAL RESULT ENTRY): POC Glucose: 103 mg/dl — AB (ref 70–99)

## 2017-01-28 ENCOUNTER — Ambulatory Visit (INDEPENDENT_AMBULATORY_CARE_PROVIDER_SITE_OTHER): Payer: Medicare Other

## 2017-01-28 ENCOUNTER — Encounter (HOSPITAL_COMMUNITY): Payer: Self-pay | Admitting: Emergency Medicine

## 2017-01-28 ENCOUNTER — Ambulatory Visit (HOSPITAL_COMMUNITY)
Admission: EM | Admit: 2017-01-28 | Discharge: 2017-01-28 | Disposition: A | Discharge status: Home / Self Care | Payer: Medicare Other | Attending: Emergency Medicine | Admitting: Emergency Medicine

## 2017-01-28 DIAGNOSIS — R5383 Other fatigue: Secondary | ICD-10-CM | POA: Diagnosis not present

## 2017-01-28 DIAGNOSIS — I129 Hypertensive chronic kidney disease with stage 1 through stage 4 chronic kidney disease, or unspecified chronic kidney disease: Secondary | ICD-10-CM | POA: Diagnosis not present

## 2017-01-28 DIAGNOSIS — D649 Anemia, unspecified: Secondary | ICD-10-CM | POA: Diagnosis not present

## 2017-01-28 DIAGNOSIS — R0602 Shortness of breath: Secondary | ICD-10-CM | POA: Diagnosis not present

## 2017-01-28 DIAGNOSIS — R918 Other nonspecific abnormal finding of lung field: Secondary | ICD-10-CM | POA: Diagnosis not present

## 2017-01-28 DIAGNOSIS — Z79899 Other long term (current) drug therapy: Secondary | ICD-10-CM | POA: Diagnosis not present

## 2017-01-28 DIAGNOSIS — E1122 Type 2 diabetes mellitus with diabetic chronic kidney disease: Secondary | ICD-10-CM | POA: Diagnosis not present

## 2017-01-28 DIAGNOSIS — N183 Chronic kidney disease, stage 3 (moderate): Secondary | ICD-10-CM | POA: Diagnosis not present

## 2017-01-28 DIAGNOSIS — E785 Hyperlipidemia, unspecified: Secondary | ICD-10-CM | POA: Insufficient documentation

## 2017-01-28 LAB — POCT URINALYSIS DIP (DEVICE)
Bilirubin Urine: NEGATIVE
Glucose, UA: NEGATIVE mg/dL
Ketones, ur: NEGATIVE mg/dL
Nitrite: NEGATIVE
PROTEIN: NEGATIVE mg/dL
SPECIFIC GRAVITY, URINE: 1.015 (ref 1.005–1.030)
UROBILINOGEN UA: 0.2 mg/dL (ref 0.0–1.0)
pH: 6 (ref 5.0–8.0)

## 2017-01-28 LAB — POCT I-STAT, CHEM 8
BUN: 20 mg/dL (ref 6–20)
CALCIUM ION: 1.2 mmol/L (ref 1.15–1.40)
CHLORIDE: 103 mmol/L (ref 101–111)
Creatinine, Ser: 0.8 mg/dL (ref 0.44–1.00)
GLUCOSE: 105 mg/dL — AB (ref 65–99)
HCT: 34 % — ABNORMAL LOW (ref 36.0–46.0)
Hemoglobin: 11.6 g/dL — ABNORMAL LOW (ref 12.0–15.0)
Potassium: 5.3 mmol/L — ABNORMAL HIGH (ref 3.5–5.1)
Sodium: 139 mmol/L (ref 135–145)
TCO2: 26 mmol/L (ref 22–32)

## 2017-01-28 NOTE — ED Provider Notes (Signed)
HPI  SUBJECTIVE:  Heidi Moran is a 81 y.o. female who presents with "not feeling right" for 2 weeks.  She reports worsening fatigue, shortness of breath, decreased exercise tolerance, decreased appetite.  She is vague about how far she can go before she has to stop and rest.  She denies coughing, wheezing, chest pain, chest pressure or heaviness.  No fevers, nausea, vomiting.  No lower extremity edema, unintentional weight gain, orthopnea, PND, nocturia, abdominal pain.  She reports weight loss.  No melena, hematochezia, hematuria, epistaxis, vaginal bleeding.  No dysuria, urgency, frequency, cloudy odorous urine, hematuria.  She has not had any medication changes since August.  Symptoms are worse with exertion and better with rest.  She has not tried anything for symptoms.  She has a past medical history of rheumatoid arthritis, currently on infusions, diabetes.  She does not know what her glucose has been recently she does not check it regularly.  She has remote history of asthma.  Also chronic kidney disease, stage III, anemia.  No history of CHF, A. fib, hypertension, UTI.  SWF:UXNATFT, Delfino Lovett, MD   Past Medical History:  Diagnosis Date  . Anemia   . Diabetes mellitus without complication ([place])   . Eczema   . Hyperlipidemia   . Hypertension   . Osteoarthritis   . Renal disorder     History reviewed. No pertinent surgical history.  History reviewed. No pertinent family history.  Social History   Tobacco Use  . Smoking status: Never Smoker  . Smokeless tobacco: Never Used  Substance Use Topics  . Alcohol use: No  . Drug use: No    No current facility-administered medications for this encounter.   Current Outpatient Medications:  .  atorvastatin (LIPITOR) 20 MG tablet, Take 20 mg by mouth daily., Disp: , Rfl:  .  cholecalciferol (VITAMIN D) 1000 units tablet, Take 1,000 Units by mouth daily., Disp: , Rfl:  .  glyBURIDE-metformin (GLUCOVANCE) 2.5-500 MG tablet, Take 1 tablet  by mouth daily with breakfast., Disp: , Rfl:  .  Golimumab ([place] ARIA IV), Inject into the vein., Disp: , Rfl:  .  lisinopril (PRINIVIL,ZESTRIL) 10 MG tablet, Take 10 mg by mouth daily., Disp: , Rfl:  .  Multiple Vitamin (MULTIVITAMIN) capsule, Take 1 capsule by mouth daily., Disp: , Rfl:  .  naproxen (NAPROSYN) 250 MG tablet, Take by mouth 2 (two) times daily with a meal., Disp: , Rfl:   No Known Allergies   ROS  As noted in HPI.   Physical Exam  BP (!) 128/44 (BP Location: Left Arm)   Pulse 88   Temp 98.1 F (36.7 C) (Oral)   Resp (!) 22   SpO2 97%   Constitutional: Well developed, well nourished, no acute distress Eyes: PERRL, EOMI, conjunctiva normal bilaterally HENT: Normocephalic, atraumatic,mucus membranes moist Respiratory: Clear to auscultation bilaterally, no rales, no wheezing, no rhonchi Cardiovascular: Normal rate and rhythm, no murmurs, no gallops, no rubs peripheral pulses regular. GI: Soft, nondistended, normal bowel sounds, nontender, no rebound, no guarding Back: no CVAT skin: No rash, skin intact Musculoskeletal: Calves symmetric, nontender.  No edema, no tenderness, no deformities Neurologic: Alert & oriented x 3, CN II-XII grossly intact, no motor deficits, sensation grossly intact Psychiatric: Speech and behavior appropriate   ED Course   Medications - No data to display  Orders Placed This Encounter  Procedures  . Urine culture    Standing Status:   Standing    Number of Occurrences:   1  Order Specific Question:   Patient immune status    Answer:   Immunocompromised  . DG Chest 2 View    Standing Status:   Standing    Number of Occurrences:   1    Order Specific Question:   Reason for Exam (SYMPTOM  OR DIAGNOSIS REQUIRED)    Answer:   r/o effusion, edema  . POCT urinalysis dip (device)    Standing Status:   Standing    Number of Occurrences:   1  . I-STAT, chem 8    Standing Status:   Standing    Number of Occurrences:   1  . ED  EKG    Standing Status:   Standing    Number of Occurrences:   1    Order Specific Question:   Reason for Exam    Answer:   Weakness   Results for orders placed or performed during the hospital encounter of 01/28/17 (from the past 24 hour(s))  POCT urinalysis dip (device)     Status: Abnormal   Collection Time: 01/28/17  1:10 PM  Result Value Ref Range   Glucose, UA NEGATIVE NEGATIVE mg/dL   Bilirubin Urine NEGATIVE NEGATIVE   Ketones, ur NEGATIVE NEGATIVE mg/dL   Specific Gravity, Urine 1.015 1.005 - 1.030   Hgb urine dipstick TRACE (A) NEGATIVE   pH 6.0 5.0 - 8.0   Protein, ur NEGATIVE NEGATIVE mg/dL   Urobilinogen, UA 0.2 0.0 - 1.0 mg/dL   Nitrite NEGATIVE NEGATIVE   Leukocytes, UA TRACE (A) NEGATIVE  I-STAT, chem 8     Status: Abnormal   Collection Time: 01/28/17  1:41 PM  Result Value Ref Range   Sodium 139 135 - 145 mmol/L   Potassium 5.3 (H) 3.5 - 5.1 mmol/L   Chloride 103 101 - 111 mmol/L   BUN 20 6 - 20 mg/dL   Creatinine, Ser 0.80 0.44 - 1.00 mg/dL   Glucose, Bld 105 (H) 65 - 99 mg/dL   Calcium, Ion 1.20 1.15 - 1.40 mmol/L   TCO2 26 22 - 32 mmol/L   Hemoglobin 11.6 (L) 12.0 - 15.0 g/dL   HCT 34.0 (L) 36.0 - 46.0 %   Dg Chest 2 View  Result Date: 01/28/2017 CLINICAL DATA:  Shortness of breath and fatigue. EXAM: CHEST  2 VIEW COMPARISON:  None. FINDINGS: Lungs are hyperexpanded. No focal airspace consolidation, pulmonary edema, or pleural effusion. Frontal projection shows multiple nodular opacities overlying the right lower lung. Cardiopericardial silhouette is at upper limits of normal for size. The visualized bony structures of the thorax are intact. IMPRESSION: Multiple nodular densities overlying the right lung base. CT chest without contrast recommended to further evaluate. These results will be called to the ordering clinician or representative by the Radiologist Assistant, and communication documented in the PACS or zVision Dashboard. Electronically Signed   By:  Misty Stanley M.D.   On: 01/28/2017 13:25    ED Clinical Impression  Other fatigue  Pulmonary nodules/lesions, multiple   ED Assessment/Plan  Discussed with patient and spouse that her symptoms has many causes, including atrial fibrillation, CHF, acute anemia, UTI, hyperglycemia, infection, malignancy and I would not be able to rule all of them out here.  She denies chest pain, pressure, heaviness.  They have opted to start a limited workup here and if anything is abnormal, they will go to the ER.  If everything is normal, then she will need to follow-up with her primary care physician in 1-2 days.  If she  gets worse in any way, then she will need to go immediately to the ER.  They agree with this.  Does not appear to be volume overloaded, has no lower extremity edema clear lungs.  EKG, i-STAT, chest x-ray, UA.  UA with trace leukocytes, hematuria.  She has no urinary complaints, prepubic, flank or CVA tenderness so we will send off for culture prior to initiating antibiotics.  Hemoglobin noted, previous hemoglobin 8.8.  Mild hyperkalemia, down from previous.  Glucose, BUN, creatinine normal.  Otherwise i-STAT unremarkable  EKG: Normal sinus rhythm, rate 81.  Normal axis, normal intervals.  No hypertrophy.  No ST-T wave changes.  No previous EKG for comparison.  Reviewed imaging independently.  Multiple nodular opacities in the right lower lung.. No pulmonary edema, pleural effusion. See radiology report for full details.  Concern for malignancy.  Will have patient follow-up with PMD for chest CT. wrote a note to her PMD regarding today's visit.  Discussed labs, imaging, MDM, plan and followup with patient and spouse.  Discussed sn/sx that should prompt return to the ED. patient agrees with plan.   Meds ordered this encounter  Medications  . Golimumab (SIMPONI ARIA IV)    Sig: Inject into the vein.    *This clinic note was created using Dragon dictation software. Therefore, there  may be occasional mistakes despite careful proofreading.  ?    Melynda Ripple, MD 01/28/17 1426

## 2017-01-28 NOTE — Discharge Instructions (Signed)
You need to follow-up with your primary care physician in several days for a chest CT and a possible referral to a lung specialist.  We will call you if your urine culture comes back positive for UTI and we will start you on the appropriate antibiotics at that time.  Make sure you give [place] a working phone number.  Go immediately to the ER for chest pain, chest pressure or heaviness, worsening shortness of breath, start coughing up blood, fevers above 100.4, or for other concerns.

## 2017-01-28 NOTE — ED Triage Notes (Signed)
Pt c/o fatigue: 2 weeks  Sx include: fatigue, SOB upon exertion, decreased appetite, bilateral ear fullness  A&O x4... NAD... Speaking in complete sentences... Ambulatory ... Husband w/pt.

## 2017-01-30 DIAGNOSIS — Z6822 Body mass index (BMI) 22.0-22.9, adult: Secondary | ICD-10-CM | POA: Diagnosis not present

## 2017-01-30 DIAGNOSIS — R5383 Other fatigue: Secondary | ICD-10-CM | POA: Diagnosis not present

## 2017-01-30 DIAGNOSIS — R918 Other nonspecific abnormal finding of lung field: Secondary | ICD-10-CM | POA: Diagnosis not present

## 2017-01-30 DIAGNOSIS — R634 Abnormal weight loss: Secondary | ICD-10-CM | POA: Diagnosis not present

## 2017-01-30 LAB — URINE CULTURE

## 2017-02-03 DIAGNOSIS — Z79899 Other long term (current) drug therapy: Secondary | ICD-10-CM | POA: Diagnosis not present

## 2017-02-03 DIAGNOSIS — M0579 Rheumatoid arthritis with rheumatoid factor of multiple sites without organ or systems involvement: Secondary | ICD-10-CM | POA: Diagnosis not present

## 2017-02-04 ENCOUNTER — Other Ambulatory Visit: Payer: Self-pay | Admitting: Internal Medicine

## 2017-02-04 DIAGNOSIS — R918 Other nonspecific abnormal finding of lung field: Secondary | ICD-10-CM

## 2017-02-12 ENCOUNTER — Ambulatory Visit
Admission: RE | Admit: 2017-02-12 | Discharge: 2017-02-12 | Disposition: A | Discharge status: Home / Self Care | Payer: Medicare Other | Source: Ambulatory Visit | Attending: Internal Medicine | Admitting: Internal Medicine

## 2017-02-12 DIAGNOSIS — R918 Other nonspecific abnormal finding of lung field: Secondary | ICD-10-CM | POA: Diagnosis not present

## 2017-02-13 ENCOUNTER — Other Ambulatory Visit (HOSPITAL_COMMUNITY): Payer: Self-pay | Admitting: Internal Medicine

## 2017-02-13 DIAGNOSIS — R918 Other nonspecific abnormal finding of lung field: Secondary | ICD-10-CM

## 2017-02-27 ENCOUNTER — Ambulatory Visit (HOSPITAL_COMMUNITY)
Admission: RE | Admit: 2017-02-27 | Discharge: 2017-02-27 | Disposition: A | Discharge status: Home / Self Care | Payer: Medicare Other | Source: Ambulatory Visit | Attending: Internal Medicine | Admitting: Internal Medicine

## 2017-02-27 ENCOUNTER — Encounter (HOSPITAL_COMMUNITY): Payer: Self-pay

## 2017-02-27 DIAGNOSIS — K8020 Calculus of gallbladder without cholecystitis without obstruction: Secondary | ICD-10-CM | POA: Insufficient documentation

## 2017-02-27 DIAGNOSIS — I700 Atherosclerosis of aorta: Secondary | ICD-10-CM | POA: Insufficient documentation

## 2017-02-27 DIAGNOSIS — R918 Other nonspecific abnormal finding of lung field: Secondary | ICD-10-CM | POA: Insufficient documentation

## 2017-02-27 LAB — GLUCOSE, CAPILLARY: Glucose-Capillary: 155 mg/dL — ABNORMAL HIGH (ref 65–99)

## 2017-02-27 MED ORDER — FLUDEOXYGLUCOSE F - 18 (FDG) INJECTION
5.8800 | Freq: Once | INTRAVENOUS | Status: AC | PRN
  Administered 2017-02-27: 5.88 via INTRAVENOUS

## 2017-03-06 ENCOUNTER — Other Ambulatory Visit: Payer: Self-pay | Admitting: Internal Medicine

## 2017-03-06 DIAGNOSIS — K862 Cyst of pancreas: Secondary | ICD-10-CM

## 2017-03-07 DIAGNOSIS — E11319 Type 2 diabetes mellitus with unspecified diabetic retinopathy without macular edema: Secondary | ICD-10-CM | POA: Diagnosis not present

## 2017-03-07 DIAGNOSIS — Z79899 Other long term (current) drug therapy: Secondary | ICD-10-CM | POA: Diagnosis not present

## 2017-03-07 DIAGNOSIS — R634 Abnormal weight loss: Secondary | ICD-10-CM | POA: Diagnosis not present

## 2017-03-07 DIAGNOSIS — M859 Disorder of bone density and structure, unspecified: Secondary | ICD-10-CM | POA: Diagnosis not present

## 2017-03-07 DIAGNOSIS — I129 Hypertensive chronic kidney disease with stage 1 through stage 4 chronic kidney disease, or unspecified chronic kidney disease: Secondary | ICD-10-CM | POA: Diagnosis not present

## 2017-03-07 DIAGNOSIS — M353 Polymyalgia rheumatica: Secondary | ICD-10-CM | POA: Diagnosis not present

## 2017-03-07 DIAGNOSIS — R2681 Unsteadiness on feet: Secondary | ICD-10-CM | POA: Diagnosis not present

## 2017-03-07 DIAGNOSIS — M069 Rheumatoid arthritis, unspecified: Secondary | ICD-10-CM | POA: Diagnosis not present

## 2017-03-07 DIAGNOSIS — N183 Chronic kidney disease, stage 3 (moderate): Secondary | ICD-10-CM | POA: Diagnosis not present

## 2017-03-07 DIAGNOSIS — R918 Other nonspecific abnormal finding of lung field: Secondary | ICD-10-CM | POA: Diagnosis not present

## 2017-03-16 ENCOUNTER — Ambulatory Visit
Admission: RE | Admit: 2017-03-16 | Discharge: 2017-03-16 | Disposition: A | Discharge status: Home / Self Care | Payer: Medicare Other | Source: Ambulatory Visit | Attending: Internal Medicine | Admitting: Internal Medicine

## 2017-03-16 DIAGNOSIS — K862 Cyst of pancreas: Secondary | ICD-10-CM | POA: Diagnosis not present

## 2017-03-16 MED ORDER — GADOBENATE DIMEGLUMINE 529 MG/ML IV SOLN
10.0000 mL | Freq: Once | INTRAVENOUS | Status: AC | PRN
  Administered 2017-03-16: 10 mL via INTRAVENOUS

## 2017-03-27 ENCOUNTER — Encounter: Payer: Self-pay | Admitting: Pulmonary Disease

## 2017-03-27 ENCOUNTER — Ambulatory Visit (INDEPENDENT_AMBULATORY_CARE_PROVIDER_SITE_OTHER): Payer: Medicare Other | Admitting: Pulmonary Disease

## 2017-03-27 ENCOUNTER — Telehealth: Payer: Self-pay

## 2017-03-27 VITALS — BP 114/66 | HR 74 | Ht 61.0 in | Wt 116.4 lb

## 2017-03-27 DIAGNOSIS — R911 Solitary pulmonary nodule: Secondary | ICD-10-CM | POA: Diagnosis not present

## 2017-03-27 NOTE — Progress Notes (Addendum)
Heidi Moran    [id number]    11-28-1935  Primary Care Physician:Aronson, Delfino Lovett, MD  Referring Physician: Burnard Bunting, MD [address] [place], [place] [address]  Chief complaint:  Consult for lung nodules  HPI: 82 year old with history of rheumatoid arthritis, diabetes, hyperlipidemia, hypertension.  She was evaluated in the ED recently fatigue, cough with a chest x-ray that showed lung nodules.  She had a follow-up CT scan and PET scan which demonstrated multiple pulmonary nodules, mild uptake in the dominant nodule.  [Reports below]. She has history of rheumatoid arthritis diagnosed about 2 years ago.  She follows with Dr. Trudie Reed and is on Golinumab infusions.    Pets: Cats.  No birds, farm animals Occupation: Housewife Exposures: None exposures Smoking history: Never smoker Travel History:Grew up in [place].  Moved to [place] 35 years ago.  Outpatient Encounter Medications as of 03/27/2017  Medication Sig  . atorvastatin (LIPITOR) 20 MG tablet Take 20 mg by mouth daily.  Marland Kitchen glyBURIDE-metformin (GLUCOVANCE) 2.5-500 MG tablet Take 1 tablet by mouth daily with breakfast.  . Golimumab (Galt ARIA IV) Inject into the vein.  Marland Kitchen lisinopril (PRINIVIL,ZESTRIL) 10 MG tablet Take 10 mg by mouth daily.  . Multiple Vitamin (MULTIVITAMIN) capsule Take 1 capsule by mouth daily.  . [DISCONTINUED] naproxen (NAPROSYN) 250 MG tablet Take by mouth 2 (two) times daily with a meal.  . cholecalciferol (VITAMIN D) 1000 units tablet Take 1,000 Units by mouth daily.   No facility-administered encounter medications on file as of 03/27/2017.     Allergies as of 03/27/2017  . (No Known Allergies)    Past Medical History:  Diagnosis Date  . Anemia   . Diabetes mellitus without complication ([place])   . Eczema   . Hyperlipidemia   . Hypertension   . Osteoarthritis   . Renal disorder     No past surgical history on file.  No family history on file.  Social History    Socioeconomic History  . Marital status: Married    Spouse name: Not on file  . Number of children: Not on file  . Years of education: Not on file  . Highest education level: Not on file  Social Needs  . Financial resource strain: Not on file  . Food insecurity - worry: Not on file  . Food insecurity - inability: Not on file  . Transportation needs - medical: Not on file  . Transportation needs - non-medical: Not on file  Occupational History  . Not on file  Tobacco Use  . Smoking status: Never Smoker  . Smokeless tobacco: Never Used  Substance and Sexual Activity  . Alcohol use: No  . Drug use: No  . Sexual activity: Not on file  Other Topics Concern  . Not on file  Social History Narrative  . Not on file    Review of systems: Review of Systems  Constitutional: Negative for fever and chills.  HENT: Negative.   Eyes: Negative for blurred vision.  Respiratory: as per HPI  Cardiovascular: Negative for chest pain and palpitations.  Gastrointestinal: Negative for vomiting, diarrhea, blood per rectum. Genitourinary: Negative for dysuria, urgency, frequency and hematuria.  Musculoskeletal: Negative for myalgias, back pain and joint pain.  Skin: Negative for itching and rash.  Neurological: Negative for dizziness, tremors, focal weakness, seizures and loss of consciousness.  Endo/Heme/Allergies: Negative for environmental allergies.  Psychiatric/Behavioral: Negative for depression, suicidal ideas and hallucinations.  All other systems reviewed and are negative.  Physical Exam: Blood pressure 114/66, pulse 74, height 5\' 1"  (1.549 m), weight 116 lb 6.4 oz (52.8 kg), SpO2 97 %. Gen:      No acute distress HEENT:  EOMI, sclera anicteric Neck:     No masses; no thyromegaly Lungs:    Clear to auscultation bilaterally; normal respiratory effort CV:         Regular rate and rhythm; no murmurs Abd:      + bowel sounds; soft, non-tender; no palpable masses, no distension Ext:     No edema; adequate peripheral perfusion Skin:      Warm and dry; no rash Neuro: alert and oriented x 3 Psych: normal mood and affect  Data Reviewed: Chest x-ray 01/28/17-multiple nodular densities. CT chest 02/12/17-borderline precarinal lymph nodes, multiple pulmonary nodules bilaterally.  The largest one in the right upper lobe measures 13 x11 mm. PET scan 02/27/17-dominant nodule in the right middle lobe has mild-moderate FDG uptake.  No other areas of uptake. Reviewed the images personally  Assessment:  Consult for evaluation of multiple pulmonary nodules Her images reviewed which showed rounded nodular opacities.  This could be consistent with rheumatoid nodules.  Rheumatoid nodules are known to have low FDG uptake.  She is a non-smoker with low risk of malignancy.  The PET scan does not reveal any other abnormal areas of uptake.  As per the primary care note there are chest x-rays from past which shows stable lung nodules.  This is reassuring however I do not have the report or the images to review.  We get a follow-up CT of the chest and obtain reports and imaging of prior chest x-rays.  We will also obtain records from rheumatology regarding her rheumatoid arthritis.  Plan/Recommendations: - Follow-up CT chest 3 months from previous scan - Obtain records from primary care rheumatology  Marshell Garfinkel MD [facility] Pager [phone] 03/27/2017, 10:56 AM  CC: Burnard Bunting, MD  Addendum: Received reports (no images) from primary care office Chest x-ray 04/11/16 Stable right lung nodules likely granuloma.  No change compared to 03/31/15  Marshell Garfinkel MD [facility] Pager [phone] 04/11/2017, 4:27 PM

## 2017-03-27 NOTE — Patient Instructions (Signed)
We will get a follow-up CT in 2 months time. We will try to obtain records from Dr. Reynaldo Minium and Dr. Trudie Reed rheumatology regarding a prior imaging Follow-up after CT scan.

## 2017-03-27 NOTE — Telephone Encounter (Signed)
Lm for [facility] with Dr. Jacquiline Doe requesting that all imaging be faxed to our office.

## 2017-03-31 DIAGNOSIS — M0579 Rheumatoid arthritis with rheumatoid factor of multiple sites without organ or systems involvement: Secondary | ICD-10-CM | POA: Diagnosis not present

## 2017-04-08 NOTE — Telephone Encounter (Signed)
Records have been placed and placed in PM's cubby. Nothing further is needed.

## 2017-04-28 DIAGNOSIS — H52203 Unspecified astigmatism, bilateral: Secondary | ICD-10-CM | POA: Diagnosis not present

## 2017-04-28 DIAGNOSIS — H02052 Trichiasis without entropian right lower eyelid: Secondary | ICD-10-CM | POA: Diagnosis not present

## 2017-04-28 DIAGNOSIS — E113293 Type 2 diabetes mellitus with mild nonproliferative diabetic retinopathy without macular edema, bilateral: Secondary | ICD-10-CM | POA: Diagnosis not present

## 2017-04-28 DIAGNOSIS — H02054 Trichiasis without entropian left upper eyelid: Secondary | ICD-10-CM | POA: Diagnosis not present

## 2017-05-17 ENCOUNTER — Encounter (HOSPITAL_COMMUNITY): Payer: Self-pay | Admitting: Radiology

## 2017-05-17 ENCOUNTER — Other Ambulatory Visit: Payer: Self-pay

## 2017-05-17 ENCOUNTER — Emergency Department (HOSPITAL_COMMUNITY): Payer: Medicare Other

## 2017-05-17 ENCOUNTER — Inpatient Hospital Stay (HOSPITAL_COMMUNITY)
Admission: EM | Admit: 2017-05-17 | Discharge: 2017-05-21 | DRG: 202 | Disposition: A | Discharge status: Skilled Nursing Facility | Payer: Medicare Other | Attending: Internal Medicine | Admitting: Internal Medicine

## 2017-05-17 DIAGNOSIS — D649 Anemia, unspecified: Secondary | ICD-10-CM | POA: Diagnosis not present

## 2017-05-17 DIAGNOSIS — J988 Other specified respiratory disorders: Secondary | ICD-10-CM | POA: Diagnosis not present

## 2017-05-17 DIAGNOSIS — R531 Weakness: Secondary | ICD-10-CM | POA: Diagnosis not present

## 2017-05-17 DIAGNOSIS — Z111 Encounter for screening for respiratory tuberculosis: Secondary | ICD-10-CM | POA: Diagnosis not present

## 2017-05-17 DIAGNOSIS — Z79899 Other long term (current) drug therapy: Secondary | ICD-10-CM | POA: Diagnosis not present

## 2017-05-17 DIAGNOSIS — J449 Chronic obstructive pulmonary disease, unspecified: Secondary | ICD-10-CM | POA: Diagnosis not present

## 2017-05-17 DIAGNOSIS — R52 Pain, unspecified: Secondary | ICD-10-CM | POA: Diagnosis not present

## 2017-05-17 DIAGNOSIS — I10 Essential (primary) hypertension: Secondary | ICD-10-CM | POA: Diagnosis present

## 2017-05-17 DIAGNOSIS — E1110 Type 2 diabetes mellitus with ketoacidosis without coma: Secondary | ICD-10-CM | POA: Diagnosis present

## 2017-05-17 DIAGNOSIS — R0902 Hypoxemia: Secondary | ICD-10-CM | POA: Diagnosis not present

## 2017-05-17 DIAGNOSIS — R2681 Unsteadiness on feet: Secondary | ICD-10-CM | POA: Diagnosis not present

## 2017-05-17 DIAGNOSIS — E785 Hyperlipidemia, unspecified: Secondary | ICD-10-CM | POA: Diagnosis present

## 2017-05-17 DIAGNOSIS — E875 Hyperkalemia: Secondary | ICD-10-CM | POA: Diagnosis present

## 2017-05-17 DIAGNOSIS — D638 Anemia in other chronic diseases classified elsewhere: Secondary | ICD-10-CM | POA: Diagnosis present

## 2017-05-17 DIAGNOSIS — R404 Transient alteration of awareness: Secondary | ICD-10-CM | POA: Diagnosis not present

## 2017-05-17 DIAGNOSIS — J80 Acute respiratory distress syndrome: Secondary | ICD-10-CM | POA: Diagnosis not present

## 2017-05-17 DIAGNOSIS — M6281 Muscle weakness (generalized): Secondary | ICD-10-CM | POA: Diagnosis not present

## 2017-05-17 DIAGNOSIS — J9801 Acute bronchospasm: Secondary | ICD-10-CM | POA: Diagnosis not present

## 2017-05-17 DIAGNOSIS — J9601 Acute respiratory failure with hypoxia: Secondary | ICD-10-CM | POA: Diagnosis present

## 2017-05-17 DIAGNOSIS — Z7984 Long term (current) use of oral hypoglycemic drugs: Secondary | ICD-10-CM | POA: Diagnosis not present

## 2017-05-17 DIAGNOSIS — R0602 Shortness of breath: Secondary | ICD-10-CM | POA: Diagnosis not present

## 2017-05-17 DIAGNOSIS — M0510 Rheumatoid lung disease with rheumatoid arthritis of unspecified site: Secondary | ICD-10-CM | POA: Diagnosis present

## 2017-05-17 DIAGNOSIS — K5900 Constipation, unspecified: Secondary | ICD-10-CM | POA: Diagnosis not present

## 2017-05-17 DIAGNOSIS — E119 Type 2 diabetes mellitus without complications: Secondary | ICD-10-CM | POA: Diagnosis not present

## 2017-05-17 DIAGNOSIS — R918 Other nonspecific abnormal finding of lung field: Secondary | ICD-10-CM | POA: Diagnosis present

## 2017-05-17 LAB — CBC
HEMATOCRIT: 38.5 % (ref 36.0–46.0)
Hemoglobin: 12.3 g/dL (ref 12.0–15.0)
MCH: 30.1 pg (ref 26.0–34.0)
MCHC: 31.9 g/dL (ref 30.0–36.0)
MCV: 94.1 fL (ref 78.0–100.0)
PLATELETS: 395 10*3/uL (ref 150–400)
RBC: 4.09 MIL/uL (ref 3.87–5.11)
RDW: 13.8 % (ref 11.5–15.5)
WBC: 13.5 10*3/uL — AB (ref 4.0–10.5)

## 2017-05-17 LAB — BRAIN NATRIURETIC PEPTIDE: B Natriuretic Peptide: 150 pg/mL — ABNORMAL HIGH (ref 0.0–100.0)

## 2017-05-17 LAB — COMPREHENSIVE METABOLIC PANEL
ALK PHOS: 81 U/L (ref 38–126)
ALT: 35 U/L (ref 14–54)
AST: 36 U/L (ref 15–41)
Albumin: 3.4 g/dL — ABNORMAL LOW (ref 3.5–5.0)
Anion gap: 13 (ref 5–15)
BILIRUBIN TOTAL: 0.6 mg/dL (ref 0.3–1.2)
BUN: 39 mg/dL — AB (ref 6–20)
CALCIUM: 8.7 mg/dL — AB (ref 8.9–10.3)
CO2: 23 mmol/L (ref 22–32)
Chloride: 96 mmol/L — ABNORMAL LOW (ref 101–111)
Creatinine, Ser: 1.26 mg/dL — ABNORMAL HIGH (ref 0.44–1.00)
GFR calc Af Amer: 46 mL/min — ABNORMAL LOW (ref >60)
GFR, EST NON AFRICAN AMERICAN: 39 mL/min — AB (ref >60)
GLUCOSE: 186 mg/dL — AB (ref 65–99)
POTASSIUM: 5.2 mmol/L — AB (ref 3.5–5.1)
Sodium: 132 mmol/L — ABNORMAL LOW (ref 135–145)
Total Protein: 7 g/dL (ref 6.5–8.1)

## 2017-05-17 LAB — RESPIRATORY PANEL BY PCR
ADENOVIRUS-RVPPCR: NOT DETECTED
Bordetella pertussis: NOT DETECTED
CHLAMYDOPHILA PNEUMONIAE-RVPPCR: NOT DETECTED
CORONAVIRUS 229E-RVPPCR: NOT DETECTED
CORONAVIRUS HKU1-RVPPCR: NOT DETECTED
CORONAVIRUS NL63-RVPPCR: NOT DETECTED
CORONAVIRUS OC43-RVPPCR: NOT DETECTED
INFLUENZA A-RVPPCR: NOT DETECTED
Influenza B: NOT DETECTED
MYCOPLASMA PNEUMONIAE-RVPPCR: NOT DETECTED
Metapneumovirus: NOT DETECTED
PARAINFLUENZA VIRUS 1-RVPPCR: NOT DETECTED
PARAINFLUENZA VIRUS 4-RVPPCR: NOT DETECTED
Parainfluenza Virus 2: NOT DETECTED
Parainfluenza Virus 3: NOT DETECTED
Respiratory Syncytial Virus: NOT DETECTED
Rhinovirus / Enterovirus: NOT DETECTED

## 2017-05-17 LAB — I-STAT CG4 LACTIC ACID, ED: LACTIC ACID, VENOUS: 1 mmol/L (ref 0.5–1.9)

## 2017-05-17 LAB — LIPASE, BLOOD: Lipase: 35 U/L (ref 11–51)

## 2017-05-17 MED ORDER — ENOXAPARIN SODIUM 30 MG/0.3ML ~~LOC~~ SOLN
30.0000 mg | SUBCUTANEOUS | Status: DC
  Administered 2017-05-18 – 2017-05-20 (×4): 30 mg via SUBCUTANEOUS
  Filled 2017-05-17 (×5): qty 0.3

## 2017-05-17 MED ORDER — GLYBURIDE 2.5 MG PO TABS
2.5000 mg | ORAL_TABLET | Freq: Every day | ORAL | Status: DC
  Filled 2017-05-17: qty 1

## 2017-05-17 MED ORDER — SODIUM CHLORIDE 0.9 % IV BOLUS (SEPSIS)
1000.0000 mL | Freq: Once | INTRAVENOUS | Status: AC
Start: 2017-05-17 — End: 2017-05-17
  Administered 2017-05-17: 1000 mL via INTRAVENOUS

## 2017-05-17 MED ORDER — INSULIN ASPART 100 UNIT/ML ~~LOC~~ SOLN
0.0000 [IU] | Freq: Three times a day (TID) | SUBCUTANEOUS | Status: DC
  Administered 2017-05-19: 1 [IU] via SUBCUTANEOUS
  Administered 2017-05-19: 3 [IU] via SUBCUTANEOUS
  Administered 2017-05-20 – 2017-05-21 (×2): 5 [IU] via SUBCUTANEOUS

## 2017-05-17 MED ORDER — ORAL CARE MOUTH RINSE
15.0000 mL | Freq: Two times a day (BID) | OROMUCOSAL | Status: DC
  Administered 2017-05-18 – 2017-05-21 (×4): 15 mL via OROMUCOSAL

## 2017-05-17 MED ORDER — IPRATROPIUM-ALBUTEROL 0.5-2.5 (3) MG/3ML IN SOLN
3.0000 mL | RESPIRATORY_TRACT | Status: DC
  Administered 2017-05-18 (×2): 3 mL via RESPIRATORY_TRACT
  Filled 2017-05-17: qty 3

## 2017-05-17 MED ORDER — AZITHROMYCIN 250 MG PO TABS
250.0000 mg | ORAL_TABLET | Freq: Every day | ORAL | Status: DC
  Administered 2017-05-18: 250 mg via ORAL
  Filled 2017-05-17: qty 1

## 2017-05-17 MED ORDER — METFORMIN HCL 500 MG PO TABS
500.0000 mg | ORAL_TABLET | Freq: Every day | ORAL | Status: DC
  Filled 2017-05-17: qty 1

## 2017-05-17 MED ORDER — AZITHROMYCIN 250 MG PO TABS: 500.0000 mg | ORAL_TABLET | Freq: Once | ORAL | Status: DC

## 2017-05-17 MED ORDER — SODIUM CHLORIDE 0.9 % IV SOLN
500.0000 mg | Freq: Once | INTRAVENOUS | Status: AC
  Administered 2017-05-17: 500 mg via INTRAVENOUS
  Filled 2017-05-17: qty 500

## 2017-05-17 MED ORDER — SODIUM CHLORIDE 0.9 % IV SOLN
1.0000 g | Freq: Once | INTRAVENOUS | Status: AC
  Administered 2017-05-17: 1 g via INTRAVENOUS
  Filled 2017-05-17: qty 10

## 2017-05-17 MED ORDER — SODIUM CHLORIDE 0.9 % IV SOLN: 1.0000 g | INTRAVENOUS | Status: DC

## 2017-05-17 MED ORDER — IOPAMIDOL (ISOVUE-370) INJECTION 76%
INTRAVENOUS | Status: AC
  Administered 2017-05-17: 50 mL
  Filled 2017-05-17: qty 100

## 2017-05-17 MED ORDER — GLYBURIDE-METFORMIN 2.5-500 MG PO TABS: 1.0000 | ORAL_TABLET | Freq: Every day | ORAL | Status: DC

## 2017-05-17 MED ORDER — AZITHROMYCIN 250 MG PO TABS: 250.0000 mg | ORAL_TABLET | Freq: Every day | ORAL | Status: DC

## 2017-05-17 MED ORDER — SODIUM CHLORIDE 0.9 % IV BOLUS (SEPSIS)
500.0000 mL | Freq: Once | INTRAVENOUS | Status: AC
  Administered 2017-05-17: 500 mL via INTRAVENOUS

## 2017-05-17 MED ORDER — PREDNISONE 20 MG PO TABS: 40.0000 mg | ORAL_TABLET | Freq: Every day | ORAL | Status: DC

## 2017-05-17 MED ORDER — PREDNISONE 20 MG PO TABS
40.0000 mg | ORAL_TABLET | Freq: Every day | ORAL | Status: DC
  Administered 2017-05-18 – 2017-05-21 (×4): 40 mg via ORAL
  Filled 2017-05-17 (×5): qty 2

## 2017-05-17 MED ORDER — INSULIN ASPART 100 UNIT/ML ~~LOC~~ SOLN
0.0000 [IU] | Freq: Every day | SUBCUTANEOUS | Status: DC
  Administered 2017-05-19: 4 [IU] via SUBCUTANEOUS
  Administered 2017-05-20: 5 [IU] via SUBCUTANEOUS

## 2017-05-17 MED ORDER — IOPAMIDOL (ISOVUE-370) INJECTION 76%
INTRAVENOUS | Status: AC
  Filled 2017-05-17: qty 50

## 2017-05-17 MED ORDER — IPRATROPIUM-ALBUTEROL 0.5-2.5 (3) MG/3ML IN SOLN
3.0000 mL | Freq: Once | RESPIRATORY_TRACT | Status: AC
  Administered 2017-05-17: 3 mL via RESPIRATORY_TRACT
  Filled 2017-05-17: qty 3

## 2017-05-17 NOTE — ED Triage Notes (Signed)
Pt in via [facility] c/o generalized weakness, pt reports decreased appetite, pt reports diarrhea x 2 days ago, pt worried about lung nodules that already been evaluated but worsening, denies SOB, A&O x4

## 2017-05-17 NOTE — ED Notes (Signed)
Patient transported to CT 

## 2017-05-17 NOTE — Progress Notes (Signed)
Received patient from ED, assessment completed, VS documented, oriented patient to the room.  CHG bath completed, MRSA swab sent, Will continue to monitor. 

## 2017-05-17 NOTE — ED Notes (Signed)
Pt placed on 2 L of O2, per Abigail Butts - RN.

## 2017-05-17 NOTE — Discharge Instructions (Signed)
° ° °  Outpatient Metformin Instructions (Glucophage, Glucovance, Fortamet, Riomet, Metaglip, Glumetza, Actoplus met  Avandamet, Janumet)   Patient: Heidi Moran                                                05/17/2017:    Radiology Exam:  CT angio Chest PE   As part of your exam today in the Radiology Department, you were given a radiographic contrast material or x-ray dye.  Because you have had this contrast material and you are taking a Metformin drug (Glucophage, Glucovance, Avandamet, Fortamet, Riomet, Metaglip, Glumetza, Actoplus met, Actoplus Met XR, Prandimet or Janumet), please observe the following instructions:   DO NOT  Take this medication for 48 hours after your exam.  Because you have normal renal function and have no comorbidities, you may restart your medication in 48 hours with no need for a renal function test or consultation with your physician.  You have normal renal function but have some comorbidities.  Comorbidities include liver disease, alcohol overuse, heart failure, myocardial or muscular ischemia, sepsis, or other severe infection.  Therefore you should consult your physician before restarting your medication.  You have impaired renal function.  You should consult your physician before restarting your medication and you are advised to get a renal function test before restarting your medication.  Please discuss this with your physician.   Call your doctor before you start taking this medication again.  Your doctor may want to check your kidney function before you start taking this medication again.  I understand these instructions and have had an opportunity to discuss them with Radiology Department personnel.

## 2017-05-17 NOTE — ED Provider Notes (Signed)
[facility] EMERGENCY DEPARTMENT Provider Note   CSN: [id number] Arrival date & time: 05/17/17  1513     History   Chief Complaint Chief Complaint  Patient presents with  . Weakness    HPI Heidi Moran is a 82 y.o. female.  HPI  82 year old female history of hyperlipidemia, hypertension, diabetes, rheumatoid arthritis on immunosuppressive's every 8 weeks has had progressive generalized weakness over the past 4 months been evaluated multiple times by primary care providers/pulmonologist currently following for findings on CT chest with multiple nodules concerning for metastatic disease.  Patient presents today with 1-1/2 weeks of worsening weakness, increased work of breathing, diarrhea and feels dehydrated.  Patient's baseline functional status is independent ambulation.  Patient is accompanied by her husband.  Past Medical History:  Diagnosis Date  . Anemia   . Diabetes mellitus without complication ([place])   . Eczema   . Hyperlipidemia   . Hypertension   . Osteoarthritis   . Renal disorder     There are no active problems to display for this patient.   No past surgical history on file.  OB History    No data available       Home Medications    Prior to Admission medications   Medication Sig Start Date End Date Taking? Authorizing Provider  atorvastatin (LIPITOR) 20 MG tablet Take 20 mg by mouth daily.   Yes [provider]  Calcium-Vitamin D-Vitamin K (VIACTIV PO) Take 1 tablet by mouth daily.   Yes [provider]  glyBURIDE-metformin (GLUCOVANCE) 2.5-500 MG tablet Take 1 tablet by mouth daily with breakfast.   Yes [provider]  lisinopril (PRINIVIL,ZESTRIL) 10 MG tablet Take 10 mg by mouth daily.   Yes [provider]  Golimumab ([place]yler ARIA IV) Inject into the vein.    [provider]    Family History No family history on file.  Social History Social History   Tobacco Use  . Smoking  status: Never Smoker  . Smokeless tobacco: Never Used  Substance Use Topics  . Alcohol use: No  . Drug use: No     Allergies   Patient has no known allergies.   Review of Systems Review of Systems  Review of Systems  Constitutional: Negative for fever and chills.  HENT: Negative for ear pain, sore throat and trouble swallowing.   Eyes: Negative for pain and visual disturbance.  Respiratory: Negative for cough and shortness of breath.   Cardiovascular: Negative for chest pain and leg swelling.  Gastrointestinal: Negative for nausea, vomiting, abdominal pain and diarrhea.  Genitourinary: Negative for dysuria, urgency and frequency.  Musculoskeletal: Negative for back pain and joint swelling.  Skin: Negative for rash and wound.  Neurological: Negative for dizziness, syncope, speech difficulty, weakness and numbness.   Physical Exam Updated Vital Signs BP (!) 140/48   Pulse 97   Temp 97.9 F (36.6 C) (Oral)   Resp (!) 32   Ht 5\' 1"  (1.549 m)   Wt 50.8 kg (112 lb)   SpO2 97%   BMI 21.16 kg/m    Physical Exam  Physical Exam Vitals:   05/17/17 1519 05/17/17 1645  BP:  (!) 140/48  Pulse:  97  Resp:  (!) 32  Temp:    SpO2: (!) 85% 97%   Constitutional: Patient is in no acute distress Head: Normocephalic and atraumatic.  Eyes: Extraocular motion intact, no scleral icterus Neck: Supple without meningismus, mass, or overt JVD Respiratory: Effort normal and breath sounds  normal. Mild increased work of breathing CV: Heart regular rate and rhythm, no obvious murmurs.  Pulses +2 and symmetric Abdomen: Soft, non-tender, non-distended MSK: Extremities are atraumatic without deformity, ROM intact Skin: Warm, dry, intact Neuro: Alert and oriented, no motor deficit noted Psychiatric: Mood and affect are normal.  ED Treatments / Results  Labs (all labs ordered are listed, but only abnormal results are displayed) Labs Reviewed  COMPREHENSIVE METABOLIC PANEL - Abnormal;  Notable for the following components:      Result Value   Sodium 132 (*)    Potassium 5.2 (*)    Chloride 96 (*)    Glucose, Bld 186 (*)    BUN 39 (*)    Creatinine, Ser 1.26 (*)    Calcium 8.7 (*)    Albumin 3.4 (*)    GFR calc non Af Amer 39 (*)    GFR calc Af Amer 46 (*)    All other components within normal limits  CBC - Abnormal; Notable for the following components:   WBC 13.5 (*)    All other components within normal limits  BRAIN NATRIURETIC PEPTIDE - Abnormal; Notable for the following components:   B Natriuretic Peptide 150.0 (*)    All other components within normal limits  RESPIRATORY PANEL BY PCR  CULTURE, BLOOD (ROUTINE X 2)  CULTURE, BLOOD (ROUTINE X 2)  LIPASE, BLOOD  URINALYSIS, ROUTINE W REFLEX MICROSCOPIC  I-STAT CG4 LACTIC ACID, ED  I-STAT CG4 LACTIC ACID, ED    EKG  EKG Interpretation  Date/Time:  Saturday May 17 2017 18:44:23 EST Ventricular Rate:  101 PR Interval:    QRS Duration: 87 QT Interval:  328 QTC Calculation: 426 R Axis:   -70 Text Interpretation:  Sinus tachycardia Left axis deviation Confirmed by Lajean Saver [id number]) on 05/17/2017 7:02:40 PM       Radiology Dg Chest 2 View  Result Date: 05/17/2017 CLINICAL DATA:  Generalized weakness. EXAM: CHEST  2 VIEW COMPARISON:  Radiographs of January 28, 2017. PET scan of February 27, 2017. FINDINGS: The heart size and mediastinal contours are within normal limits. No pneumothorax or pleural effusion is noted. There are again noted multiple nodules in the right lung, as described on prior chest radiograph and PET scan. No new consolidative process is noted. The visualized skeletal structures are unremarkable. IMPRESSION: Stable right lung nodules are noted as described on prior chest radiograph in PET scan. Malignancy cannot be excluded. Electronically Signed   By: Marijo Conception, M.D.   On: 05/17/2017 16:04   Ct Angio Chest Pe W And/or Wo Contrast  Result Date: 05/17/2017 CLINICAL DATA:   82 year old female with history of shortness of breath over the past couple weeks. EXAM: CT ANGIOGRAPHY CHEST WITH CONTRAST TECHNIQUE: Multidetector CT imaging of the chest was performed using the standard protocol during bolus administration of intravenous contrast. Multiplanar CT image reconstructions and MIPs were obtained to evaluate the vascular anatomy. CONTRAST:  65mL ISOVUE-370 IOPAMIDOL (ISOVUE-370) INJECTION 76% COMPARISON:  PET-CT 02/27/2017. FINDINGS: Cardiovascular: No filling defect in the pulmonary artery tree to suggest underlying pulmonary embolism. Heart size is borderline enlarged. There is no significant pericardial fluid, thickening or pericardial calcification. Mediastinum/Nodes: Multiple prominent borderline enlarged mediastinal and bilateral hilar lymph nodes measuring up to 12 mm in short axis. Esophagus is unremarkable in appearance. No axillary lymphadenopathy. Lungs/Pleura: Again noted are numerous pulmonary nodules scattered throughout the lungs bilaterally, similar in size, number and distribution to prior PET-CT 02/27/2017, largest of which is a 13 mm  nodule in the lateral segment of the right middle lobe (axial image 91 of series 6). Several of these nodules have some surrounding ground-glass attenuation. No acute consolidative airspace disease. No pleural effusions. Upper Abdomen: Aortic atherosclerosis. Musculoskeletal: There are no aggressive appearing lytic or blastic lesions noted in the visualized portions of the skeleton. Review of the MIP images confirms the above findings. IMPRESSION: 1. No evidence of pulmonary embolism. 2. No acute findings noted in the thorax. 3. Numerous bilateral pulmonary nodules stable compared to prior examination from 02/27/2017. These are of uncertain etiology and significance, but their stability over time could suggest a benign etiology unless the patient is undergoing active treatment for malignancy. One consideration is that of diffuse  idiopathic neuroendocrine cell hyperplasia (DIPNECH). Continued attention on followup studies is recommended. Outpatient referral to pulmonology for further evaluation should also be considered if not already obtained. Electronically Signed   By: Vinnie Langton M.D.   On: 05/17/2017 18:32    Procedures Procedures (including critical care time)  Medications Ordered in ED Medications  azithromycin (ZITHROMAX) 500 mg in sodium chloride 0.9 % 250 mL IVPB (500 mg Intravenous New Bag/Given 05/17/17 2011)  sodium chloride 0.9 % bolus 500 mL (0 mLs Intravenous Stopped 05/17/17 1847)  sodium chloride 0.9 % bolus 1,000 mL (1,000 mLs Intravenous New Bag/Given 05/17/17 1936)  iopamidol (ISOVUE-370) 76 % injection (50 mLs  Contrast Given 05/17/17 1741)  cefTRIAXone (ROCEPHIN) 1 g in sodium chloride 0.9 % 100 mL IVPB (1 g Intravenous New Bag/Given 05/17/17 1936)  ipratropium-albuterol (DUONEB) 0.5-2.5 (3) MG/3ML nebulizer solution 3 mL (3 mLs Nebulization Given 05/17/17 2011)     Initial Impression / Assessment and Plan / ED Course  I have reviewed the triage vital signs and the nursing notes.  Pertinent labs & imaging results that were available during my care of the patient were reviewed by me and considered in my medical decision making (see chart for details).     82 year old female history of hyperlipidemia, hypertension, diabetes, rheumatoid arthritis on immunosuppressive's every 8 weeks has had progressive generalized weakness over the past 4 months been evaluated multiple times by primary care providers/pulmonologist currently following for findings on CT chest with multiple nodules concerning for metastatic disease.  Patient presents today with 1-1/2 weeks of worsening weakness, increased work of breathing, diarrhea and feels dehydrated.  Patient's baseline functional status is independent ambulation.  Patient is accompanied by her husband.  Patient is mild respiratory distress with slight expiratory  wheeze.  Labs with leukocytosis of 13.5, negative discrete electrolyte imbalance aside from hyponatremia 132 slight hypoka hyperkalemia of 5.2 and renal insufficiency of creatinine 1.26And lactic acid 1.0.  CT chest rule out pulmonary embolism no evidence of thrombus.  Patient still has interval nodules throughout the lungs that has not changed from prior imaging studies.  Negative discrete pneumonia but possible early viral etiology.  Plan for empiric treatment with Rocephin/azithromycin blood cultures pending.  Patient has new oxygen requirement.  Patient's been admitted to the hospitalist group.  Final Clinical Impressions(s) / ED Diagnoses   Final diagnoses:  Hypoxia  Respiratory infection    ED Discharge Orders    None      Willette Alma, DO 05/17/17 2035  Lajean Saver, MD 05/17/17 2306

## 2017-05-17 NOTE — H&P (Signed)
History and Physical  Heidi Moran PJK:[id number] DOB: Oct 20, 1935 DOA: 05/17/2017    PCP: Burnard Bunting, MD   Chief Complaint:  dyspnea  HPI: Heidi Moran is a 82 y.o. female with hx of RA on simponi, DMT2, HLD, HTN, and known pulmonary nodules who presented with increasing dyspnea on exertion x 5 days with intermittent dry cough. She noted increasing nasal congestion, sneezing and rhinorrhea for about 7-10 days prior to admission. Found to have hypoxia to mid 80s requiring [place] O2 (sats improved to 95-97s on 3L O2).  Denies sick contacts or contact with children. Lives with her husband Did go out to eat at restaurants a few times in the past week. Denies clear fever, although reports feeling hot and cold in the past few days. Otherwise had 2 bouts of loose stools last week -- has not recurred. Denies urinary sx. Has cats at home (not new); pt suspects she may be allergic to cats, but cannot bear to part with them.   Regarding the history of multiple pulmonary nodules, has had extensive imaging including PET scan which showed mostly low grade uptake, which her outpatient pulmonologist thought were likely RA nodules.   ED course VS 85% on RA; BP 140/48 HR 97; RR 32; T 97.9 Received ceftriaxone IV, duonebs x 1  Review of Systems: All systems reviewed and apart from history of presenting illness, are negative.  Past Medical History:  Diagnosis Date  . Anemia   . Diabetes mellitus without complication ([place])   . Eczema   . Hyperlipidemia   . Hypertension   . Osteoarthritis   . Renal disorder    No past surgical history on file. Social History:  reports that  has never smoked. she has never used smokeless tobacco. She reports that she does not drink alcohol or use drugs.   No Known Allergies  No family history on file. (non contributory)  Prior to Admission medications   Medication Sig Start Date End Date Taking? Authorizing Provider  atorvastatin (LIPITOR) 20 MG tablet Take 20  mg by mouth daily.   Yes [provider]  Calcium-Vitamin D-Vitamin K (VIACTIV PO) Take 1 tablet by mouth daily.   Yes [provider]  glyBURIDE-metformin (GLUCOVANCE) 2.5-500 MG tablet Take 1 tablet by mouth daily with breakfast.   Yes [provider]  lisinopril (PRINIVIL,ZESTRIL) 10 MG tablet Take 10 mg by mouth daily.   Yes [provider]  Golimumab ([place]ney ARIA IV) Inject into the vein.    [provider]   Physical Exam: Vitals:   05/17/17 2000 05/17/17 2015 05/17/17 2030 05/17/17 2045  BP: 130/63 (!) 147/71 (!) 140/51 (!) 147/48  Pulse: 96 97 (!) 102 (!) 108  Resp: (!) 27 (!) 27 (!) 26 (!) 25  Temp:      TempSrc:      SpO2: 97% 99% 100% 95%  Weight:      Height:       GEN thin caucasian woman, dyspneic with complete sentences, mildly uncomfortable, not in acute distress HEENT NCAT EOM intact PERRL; moist mucus membranes, clear oropharynx, shoddy cervical LN JVP estimated 5 cm H2O above RA; no carotid bruits b/l CV  RRR normal s1 and s2 RESP  Poor air movement throughout; prolonged expiration with dense polyphonic wheeze ABD soft NT ND +normoactive BS EXT  Slightly cool to touch to mid shins; no edema PULSES 2+ distal pulses UE and LE NEURO no focal deficits   Labs on Admission:  Basic Metabolic Panel: Recent Labs  Lab 05/17/17 1530  NA 132*  K 5.2*  CL 96*  CO2 23  GLUCOSE 186*  BUN 39*  CREATININE 1.26*  CALCIUM 8.7*   Liver Function Tests: Recent Labs  Lab 05/17/17 1530  AST 36  ALT 35  ALKPHOS 81  BILITOT 0.6  PROT 7.0  ALBUMIN 3.4*   Recent Labs  Lab 05/17/17 1530  LIPASE 35   No results for input(s): AMMONIA in the last 168 hours. CBC: Recent Labs  Lab 05/17/17 1530  WBC 13.5*  HGB 12.3  HCT 38.5  MCV 94.1  PLT 395   Cardiac Enzymes: No results for input(s): CKTOTAL, CKMB, CKMBINDEX, TROPONINI in the last 168 hours.  BNP (last 3 results) No results for input(s): PROBNP in the last  8760 hours. CBG: No results for input(s): GLUCAP in the last 168 hours.  Radiological Exams on Admission: Dg Chest 2 View  Result Date: 05/17/2017 CLINICAL DATA:  Generalized weakness. EXAM: CHEST  2 VIEW COMPARISON:  Radiographs of January 28, 2017. PET scan of February 27, 2017. FINDINGS: The heart size and mediastinal contours are within normal limits. No pneumothorax or pleural effusion is noted. There are again noted multiple nodules in the right lung, as described on prior chest radiograph and PET scan. No new consolidative process is noted. The visualized skeletal structures are unremarkable. IMPRESSION: Stable right lung nodules are noted as described on prior chest radiograph in PET scan. Malignancy cannot be excluded. Electronically Signed   By: Marijo Conception, M.D.   On: 05/17/2017 16:04   Ct Angio Chest Pe W And/or Wo Contrast  Result Date: 05/17/2017 CLINICAL DATA:  82 year old female with history of shortness of breath over the past couple weeks. EXAM: CT ANGIOGRAPHY CHEST WITH CONTRAST TECHNIQUE: Multidetector CT imaging of the chest was performed using the standard protocol during bolus administration of intravenous contrast. Multiplanar CT image reconstructions and MIPs were obtained to evaluate the vascular anatomy. CONTRAST:  80mL ISOVUE-370 IOPAMIDOL (ISOVUE-370) INJECTION 76% COMPARISON:  PET-CT 02/27/2017. FINDINGS: Cardiovascular: No filling defect in the pulmonary artery tree to suggest underlying pulmonary embolism. Heart size is borderline enlarged. There is no significant pericardial fluid, thickening or pericardial calcification. Mediastinum/Nodes: Multiple prominent borderline enlarged mediastinal and bilateral hilar lymph nodes measuring up to 12 mm in short axis. Esophagus is unremarkable in appearance. No axillary lymphadenopathy. Lungs/Pleura: Again noted are numerous pulmonary nodules scattered throughout the lungs bilaterally, similar in size, number and distribution to  prior PET-CT 02/27/2017, largest of which is a 13 mm nodule in the lateral segment of the right middle lobe (axial image 91 of series 6). Several of these nodules have some surrounding ground-glass attenuation. No acute consolidative airspace disease. No pleural effusions. Upper Abdomen: Aortic atherosclerosis. Musculoskeletal: There are no aggressive appearing lytic or blastic lesions noted in the visualized portions of the skeleton. Review of the MIP images confirms the above findings. IMPRESSION: 1. No evidence of pulmonary embolism. 2. No acute findings noted in the thorax. 3. Numerous bilateral pulmonary nodules stable compared to prior examination from 02/27/2017. These are of uncertain etiology and significance, but their stability over time could suggest a benign etiology unless the patient is undergoing active treatment for malignancy. One consideration is that of diffuse idiopathic neuroendocrine cell hyperplasia (DIPNECH). Continued attention on followup studies is recommended. Outpatient referral to pulmonology for further evaluation should also be considered if not already obtained. Electronically Signed   By: Vinnie Langton M.D.   On:  05/17/2017 18:32    EKG: Independently reviewed. Sinus tachycardia at 101 bpm  Assessment/Plan 82 y.o. woman with hx of RA on Simponi (golimumab),diet controlled DMT2, and known multiple pulmonary nodules (rheumatoid related? unclear etiology) who had presented with progressive dyspnea, shortness of breath, found to be hypoxic and having new O2 requirement. CT chest reveals stable pulmonary nodules -- which likely are unrelated to her acute presentation. Clinically fits viral URI but given immuno-suppressant use and profound hypoxia, will empirically treat as COPD exacerbation with azithro (also received cetriaxone in ED) and closely monitor respiratory status. Exam notable for wheezing and poor air movement.   Active Problems:   Acute respiratory failure with  hypoxia (HCC)   # Acute hypoxic resp failure. Viral URI prodrome. No consolidation or infiltrative process on CTA chest. Suspect bronchitis due to viral.  - droplet precaution - resp viral panel pending - received ceftriaxone x 1 dose (05/17/17) and azithro IV - continue azithro PO x 3 days - prednisone 40mg  x 5 day course (starting tonight) - duonebs q4h - O2 sat goal > 95% with [place] - pending clinical improvement will wean O2 and nebs frequency  # Diet controlled DM - resume home glyburide-metformin - SSI and glucose check ACHS   # Lung nodules - stable on CT chest on admission - continue outpatient follow up (plan for repeat PET vs CT)    DVT Prophylaxis: lovenox subq Code Status: full code  Family Communication: husband  Disposition Plan: inpatient monitoring given O2 requirement   Time spent: > 30 mins  Colbert Ewing, MD   If 7PM-7AM, please contact night-coverage [email or web address] Password [facility] 05/17/2017, 9:14 PM

## 2017-05-18 DIAGNOSIS — E1110 Type 2 diabetes mellitus with ketoacidosis without coma: Secondary | ICD-10-CM | POA: Diagnosis present

## 2017-05-18 DIAGNOSIS — E785 Hyperlipidemia, unspecified: Secondary | ICD-10-CM | POA: Diagnosis present

## 2017-05-18 DIAGNOSIS — J9601 Acute respiratory failure with hypoxia: Secondary | ICD-10-CM

## 2017-05-18 DIAGNOSIS — I10 Essential (primary) hypertension: Secondary | ICD-10-CM | POA: Diagnosis present

## 2017-05-18 LAB — URINALYSIS, ROUTINE W REFLEX MICROSCOPIC
Bilirubin Urine: NEGATIVE
GLUCOSE, UA: 50 mg/dL — AB
Ketones, ur: 5 mg/dL — AB
NITRITE: NEGATIVE
Protein, ur: 30 mg/dL — AB
SPECIFIC GRAVITY, URINE: 1.026 (ref 1.005–1.030)
pH: 5 (ref 5.0–8.0)

## 2017-05-18 LAB — CBC
HEMATOCRIT: 34.5 % — AB (ref 36.0–46.0)
Hemoglobin: 10.9 g/dL — ABNORMAL LOW (ref 12.0–15.0)
MCH: 30.1 pg (ref 26.0–34.0)
MCHC: 31.6 g/dL (ref 30.0–36.0)
MCV: 95.3 fL (ref 78.0–100.0)
Platelets: 331 10*3/uL (ref 150–400)
RBC: 3.62 MIL/uL — ABNORMAL LOW (ref 3.87–5.11)
RDW: 13.5 % (ref 11.5–15.5)
WBC: 11.2 10*3/uL — ABNORMAL HIGH (ref 4.0–10.5)

## 2017-05-18 LAB — GLUCOSE, CAPILLARY
GLUCOSE-CAPILLARY: 89 mg/dL (ref 65–99)
GLUCOSE-CAPILLARY: 200 mg/dL — AB (ref 65–99)
Glucose-Capillary: 155 mg/dL — ABNORMAL HIGH (ref 65–99)
Glucose-Capillary: 100 mg/dL — ABNORMAL HIGH (ref 65–99)

## 2017-05-18 LAB — BASIC METABOLIC PANEL
Anion gap: 8 (ref 5–15)
BUN: 30 mg/dL — AB (ref 6–20)
CHLORIDE: 104 mmol/L (ref 101–111)
CO2: 23 mmol/L (ref 22–32)
Calcium: 8.1 mg/dL — ABNORMAL LOW (ref 8.9–10.3)
Creatinine, Ser: 0.95 mg/dL (ref 0.44–1.00)
GFR calc Af Amer: >60 mL/min (ref >60)
GFR calc non Af Amer: 55 mL/min — ABNORMAL LOW (ref >60)
GLUCOSE: 161 mg/dL — AB (ref 65–99)
POTASSIUM: 5.3 mmol/L — AB (ref 3.5–5.1)
Sodium: 135 mmol/L (ref 135–145)

## 2017-05-18 LAB — MRSA PCR SCREENING: MRSA by PCR: NEGATIVE

## 2017-05-18 MED ORDER — CALCIUM-VITAMIN D 500-200 MG-UNIT PO TABS
ORAL_TABLET | Freq: Every day | ORAL | Status: DC
  Administered 2017-05-18: 22:00:00 via ORAL
  Filled 2017-05-18 (×2): qty 1

## 2017-05-18 MED ORDER — IPRATROPIUM-ALBUTEROL 0.5-2.5 (3) MG/3ML IN SOLN
3.0000 mL | Freq: Four times a day (QID) | RESPIRATORY_TRACT | Status: DC
  Administered 2017-05-18 – 2017-05-19 (×3): 3 mL via RESPIRATORY_TRACT
  Filled 2017-05-18 (×3): qty 3

## 2017-05-18 MED ORDER — AZITHROMYCIN 250 MG PO TABS
250.0000 mg | ORAL_TABLET | Freq: Every day | ORAL | Status: AC
  Administered 2017-05-19 – 2017-05-20 (×2): 250 mg via ORAL
  Filled 2017-05-18 (×2): qty 1

## 2017-05-18 MED ORDER — ATORVASTATIN CALCIUM 20 MG PO TABS
20.0000 mg | ORAL_TABLET | Freq: Every day | ORAL | Status: DC
  Administered 2017-05-18 – 2017-05-21 (×4): 20 mg via ORAL
  Filled 2017-05-18 (×4): qty 1

## 2017-05-18 MED ORDER — LISINOPRIL 10 MG PO TABS: 10.0000 mg | ORAL_TABLET | Freq: Every day | ORAL | Status: DC

## 2017-05-18 NOTE — Plan of Care (Signed)
Not progressing at this time

## 2017-05-18 NOTE — Evaluation (Signed)
Clinical/Bedside Swallow Evaluation Patient Details  Name: Heidi Moran MRN: [id number] Date of Birth: 02/09/1936  Today's Date: 05/18/2017 Time: SLP Start Time (ACUTE ONLY): 1655 SLP Stop Time (ACUTE ONLY): 1710 SLP Time Calculation (min) (ACUTE ONLY): 15 min  Past Medical History:  Past Medical History:  Diagnosis Date  . Anemia   . Diabetes mellitus without complication ([place])   . Eczema   . Hyperlipidemia   . Hypertension   . Osteoarthritis   . Renal disorder    Past Surgical History: History reviewed. No pertinent surgical history. HPI:  Heidi Moran a 82 y.o.femalewith hx of RA on simponi, DMT2, HLD, HTN, and known pulmonary nodules who presented with increasing dyspnea on exertion x 5 days with intermittent dry cough. She noted increasing nasal congestion, sneezing and rhinorrhea for about 7-10 days prior to admission. Found to have hypoxia to mid 80s requiring [place] O2. No consolidation or infiltrative process on CTA chest; bronchitis due to viral infection suspected.   Assessment / Plan / Recommendation Clinical Impression   Patient presents with oropharyngeal swallow which appears at bedside to be within functional limits with adequate airway protection. No overt signs of aspiration observed. Pt is weak and fatigued; she takes only single sips of liquids. She is coughing at baseline; there is no increased coughing with PO. She denies history of dysphagia. RN reports pt had difficulty taking pills with liquids this morning; it appears pt may have been lethargic. RN reports pt has been more alert and no issues this afternoon with medications. Recommend regular diet with thin liquids; she can take pills whole in puree if she has further difficulty taking them with liquids. No further skilled ST needs identified. Will s/o.   SLP Visit Diagnosis: Dysphagia, unspecified (R13.10)    Aspiration Risk  Mild aspiration risk    Diet Recommendation Regular;Thin liquid   Liquid  Administration via: Cup;Straw Medication Administration: Whole meds with liquid(can take whole in puree if difficulty) Supervision: Patient able to self feed    Other  Recommendations Oral Care Recommendations: Oral care BID   Follow up Recommendations None      Frequency and Duration            Prognosis Prognosis for Safe Diet Advancement: Good      Swallow Study   General Date of Onset: 05/17/17 HPI: Heidi Moran a 82 y.o.femalewith hx of RA on simponi, DMT2, HLD, HTN, and known pulmonary nodules who presented with increasing dyspnea on exertion x 5 days with intermittent dry cough. She noted increasing nasal congestion, sneezing and rhinorrhea for about 7-10 days prior to admission. Found to have hypoxia to mid 80s requiring [place] O2. No consolidation or infiltrative process on CTA chest; bronchitis due to viral infection suspected. Type of Study: Bedside Swallow Evaluation Previous Swallow Assessment: none in chart Diet Prior to this Study: Regular;Thin liquids Temperature Spikes Noted: Yes(100.4) Respiratory Status: Nasal cannula History of Recent Intubation: No Behavior/Cognition: Alert;Cooperative;Confused Oral Cavity Assessment: Within Functional Limits Oral Care Completed by SLP: No Oral Cavity - Dentition: Adequate natural dentition Vision: Functional for self-feeding Self-Feeding Abilities: Able to feed self Patient Positioning: Upright in bed Baseline Vocal Quality: Normal Volitional Cough: Strong;Congested Volitional Swallow: Able to elicit    Oral/Motor/Sensory Function Overall Oral Motor/Sensory Function: Within functional limits   Ice Chips Ice chips: Not tested   Thin Liquid Thin Liquid: Within functional limits Presentation: Straw    Nectar Thick Nectar Thick Liquid: Not tested   Honey Thick Honey  Thick Liquid: Not tested   Puree Puree: Within functional limits Presentation: Self Fed;Spoon   Solid   GO   Solid: Within functional  limits Presentation: Self Fed;Spoon       Deneise Lever, [place], [place] Speech-Language Pathologist [phone]  ALLECIA BELLS 05/18/2017,5:20 PM

## 2017-05-18 NOTE — Progress Notes (Signed)
Patient ID: Heidi Moran, female   DOB: 12-23-1935, 82 y.o.   MRN: [id number]  PROGRESS NOTE    Heidi Moran  NOM:[id number] DOB: 19-Oct-1935 DOA: 05/17/2017 PCP: Burnard Bunting, MD    Brief Narrative: Patient 82 year old female with known history of pulmonary nodules and diabetes with no prior diagnosis of COPD presenting with bronchospasm and hypoxemia oxygen 85%. Patient also has significant upper tract infection lately. She did not get any flu swabs. She's had nasal congestion and sneezing. Patient was afebrile except for the bronchospasm and hypoxemia.  Assessment & Plan:   Active Problems:   Acute respiratory failure with hypoxia (HCC)   #1 acute respiratory failure with hypoxemia: Probably due to new bronchospasm. She has cats which are near at home and could be the trigger for her bronchospasm. She is empirically being treated. No evidence of pneumonia. Patient may not require long-term oxygen. We might get her home with bronchodilators once she is stable. Continue empiric Zithromax  #2 hypertension: We will continue home blood pressure medications. Continue home lisinopril  #3 diabetes: Blood sugar so far controlled. Continue home regimen of glyburide metformin with sliding scale insulin  #4 hyperlipidemia: Continue Lipitor  #5 anemia of chronic disease: Continue monitoring.   DVT prophylaxis: Lovenox Code Status: Full code Family Communication: Sister at bedside Disposition Plan: Home with family  Consultants:   None  Procedures: None  Antimicrobials:  -Azithromycin  Subjective: Patient is feeling much better today. Still slightly hypoxic. Mild expiratory wheezing  Objective: Vitals:   05/18/17 1251 05/18/17 1429 05/18/17 1646 05/18/17 1933  BP: (!) 105/43   (!) 110/51  Pulse:  94  96  Resp:  (!) 22  20  Temp: (!) 100.4 F (38 C)  98.4 F (36.9 C)   TempSrc: Axillary  Oral   SpO2:  95%  95%  Weight:      Height:        Intake/Output Summary (Last  24 hours) at 05/18/2017 2027 Last data filed at 05/18/2017 1700 Gross per 24 hour  Intake 370 ml  Output 800 ml  Net -430 ml   Filed Weights   05/17/17 1516 05/17/17 2246  Weight: 50.8 kg (112 lb) 52.3 kg (115 lb 3.2 oz)    Examination:  General exam: Appears calm and comfortable  Respiratory system: Patient has mild expiratory wheezing no crackles. Good air entry bilaterally Cardiovascular system: S1 & S2 heard, RRR. No JVD, murmurs, rubs, gallops or clicks. No pedal edema. Gastrointestinal system: Abdomen is nondistended, soft and nontender. No organomegaly or masses felt. Normal bowel sounds heard. Central nervous system: Alert and oriented. No focal neurological deficits. Extremities: Symmetric 5 x 5 power. Skin: No rashes, lesions or ulcers Psychiatry: Judgement and insight appear normal. Mood & affect appropriate.     Data Reviewed: I have personally reviewed following labs and imaging studies  CBC: Recent Labs  Lab 05/17/17 1530 05/18/17 0431  WBC 13.5* 11.2*  HGB 12.3 10.9*  HCT 38.5 34.5*  MCV 94.1 95.3  PLT 395 962   Basic Metabolic Panel: Recent Labs  Lab 05/17/17 1530 05/18/17 0431  NA 132* 135  K 5.2* 5.3*  CL 96* 104  CO2 23 23  GLUCOSE 186* 161*  BUN 39* 30*  CREATININE 1.26* 0.95  CALCIUM 8.7* 8.1*   GFR: Estimated Creatinine Clearance: 36.2 mL/min (by C-G formula based on SCr of 0.95 mg/dL). Liver Function Tests: Recent Labs  Lab 05/17/17 1530  AST 36  ALT 35  ALKPHOS 81  BILITOT 0.6  PROT 7.0  ALBUMIN 3.4*   Recent Labs  Lab 05/17/17 1530  LIPASE 35   No results for input(s): AMMONIA in the last 168 hours. Coagulation Profile: No results for input(s): INR, PROTIME in the last 168 hours. Cardiac Enzymes: No results for input(s): CKTOTAL, CKMB, CKMBINDEX, TROPONINI in the last 168 hours. BNP (last 3 results) No results for input(s): PROBNP in the last 8760 hours. HbA1C: No results for input(s): HGBA1C in the last 72  hours. CBG: Recent Labs  Lab 05/18/17 0606 05/18/17 1145 05/18/17 1645  GLUCAP 155* 100* 89   Lipid Profile: No results for input(s): CHOL, HDL, LDLCALC, TRIG, CHOLHDL, LDLDIRECT in the last 72 hours. Thyroid Function Tests: No results for input(s): TSH, T4TOTAL, FREET4, T3FREE, THYROIDAB in the last 72 hours. Anemia Panel: No results for input(s): VITAMINB12, FOLATE, FERRITIN, TIBC, IRON, RETICCTPCT in the last 72 hours. Urine analysis:    Component Value Date/Time   COLORURINE YELLOW 05/18/2017 0641   APPEARANCEUR CLOUDY (A) 05/18/2017 0641   LABSPEC 1.026 05/18/2017 0641   PHURINE 5.0 05/18/2017 0641   GLUCOSEU 50 (A) 05/18/2017 0641   HGBUR MODERATE (A) 05/18/2017 0641   BILIRUBINUR NEGATIVE 05/18/2017 0641   KETONESUR 5 (A) 05/18/2017 0641   PROTEINUR 30 (A) 05/18/2017 0641   UROBILINOGEN 0.2 01/28/2017 1310   NITRITE NEGATIVE 05/18/2017 0641   LEUKOCYTESUR TRACE (A) 05/18/2017 0641   Sepsis Labs: @LABRCNTIP (procalcitonin:4,lacticidven:4)  ) Recent Results (from the past 240 hour(s))  Respiratory Panel by PCR     Status: None   Collection Time: 05/17/17  5:38 PM  Result Value Ref Range Status   Adenovirus NOT DETECTED NOT DETECTED Final   Coronavirus 229E NOT DETECTED NOT DETECTED Final   Coronavirus HKU1 NOT DETECTED NOT DETECTED Final   Coronavirus NL63 NOT DETECTED NOT DETECTED Final   Coronavirus OC43 NOT DETECTED NOT DETECTED Final   Metapneumovirus NOT DETECTED NOT DETECTED Final   Rhinovirus / Enterovirus NOT DETECTED NOT DETECTED Final   Influenza A NOT DETECTED NOT DETECTED Final   Influenza B NOT DETECTED NOT DETECTED Final   Parainfluenza Virus 1 NOT DETECTED NOT DETECTED Final   Parainfluenza Virus 2 NOT DETECTED NOT DETECTED Final   Parainfluenza Virus 3 NOT DETECTED NOT DETECTED Final   Parainfluenza Virus 4 NOT DETECTED NOT DETECTED Final   Respiratory Syncytial Virus NOT DETECTED NOT DETECTED Final   Bordetella pertussis NOT DETECTED NOT  DETECTED Final   Chlamydophila pneumoniae NOT DETECTED NOT DETECTED Final   Mycoplasma pneumoniae NOT DETECTED NOT DETECTED Final    Comment: Performed at [facility], [place]. [address] [place], [place] [address]  Blood culture (routine x 2)     Status: None (Preliminary result)   Collection Time: 05/17/17  6:59 PM  Result Value Ref Range Status   Specimen Description BLOOD LEFT ANTECUBITAL  Final   Special Requests   Final    BOTTLES DRAWN AEROBIC AND ANAEROBIC Blood Culture adequate volume   Culture   Final    NO GROWTH < 24 HOURS Performed at [facility], [place] [address], [place], [place] [address]    Report Status PENDING  Incomplete  Blood culture (routine x 2)     Status: None (Preliminary result)   Collection Time: 05/17/17  7:08 PM  Result Value Ref Range Status   Specimen Description BLOOD LEFT HAND  Final   Special Requests IN PEDIATRIC BOTTLE Blood Culture adequate volume  Final   Culture  Final    NO GROWTH < 24 HOURS Performed at [facility], [place] [address], [place], [place] [address]    Report Status PENDING  Incomplete  MRSA PCR Screening     Status: None   Collection Time: 05/17/17 11:26 PM  Result Value Ref Range Status   MRSA by PCR NEGATIVE NEGATIVE Final    Comment:        The GeneXpert MRSA Assay (FDA approved for NASAL specimens only), is one component of a comprehensive MRSA colonization surveillance program. It is not intended to diagnose MRSA infection nor to guide or monitor treatment for MRSA infections. Performed at [facility], [place] [address] [place], [place] [address]          Radiology Studies: Dg Chest 2 View  Result Date: 05/17/2017 CLINICAL DATA:  Generalized weakness. EXAM: CHEST  2 VIEW COMPARISON:  Radiographs of January 28, 2017. PET scan of February 27, 2017. FINDINGS: The heart size and mediastinal contours are within normal limits. No pneumothorax or pleural effusion is noted. There are again noted  multiple nodules in the right lung, as described on prior chest radiograph and PET scan. No new consolidative process is noted. The visualized skeletal structures are unremarkable. IMPRESSION: Stable right lung nodules are noted as described on prior chest radiograph in PET scan. Malignancy cannot be excluded. Electronically Signed   By: Marijo Conception, M.D.   On: 05/17/2017 16:04   Ct Angio Chest Pe W And/or Wo Contrast  Result Date: 05/17/2017 CLINICAL DATA:  82 year old female with history of shortness of breath over the past couple weeks. EXAM: CT ANGIOGRAPHY CHEST WITH CONTRAST TECHNIQUE: Multidetector CT imaging of the chest was performed using the standard protocol during bolus administration of intravenous contrast. Multiplanar CT image reconstructions and MIPs were obtained to evaluate the vascular anatomy. CONTRAST:  19mL ISOVUE-370 IOPAMIDOL (ISOVUE-370) INJECTION 76% COMPARISON:  PET-CT 02/27/2017. FINDINGS: Cardiovascular: No filling defect in the pulmonary artery tree to suggest underlying pulmonary embolism. Heart size is borderline enlarged. There is no significant pericardial fluid, thickening or pericardial calcification. Mediastinum/Nodes: Multiple prominent borderline enlarged mediastinal and bilateral hilar lymph nodes measuring up to 12 mm in short axis. Esophagus is unremarkable in appearance. No axillary lymphadenopathy. Lungs/Pleura: Again noted are numerous pulmonary nodules scattered throughout the lungs bilaterally, similar in size, number and distribution to prior PET-CT 02/27/2017, largest of which is a 13 mm nodule in the lateral segment of the right middle lobe (axial image 91 of series 6). Several of these nodules have some surrounding ground-glass attenuation. No acute consolidative airspace disease. No pleural effusions. Upper Abdomen: Aortic atherosclerosis. Musculoskeletal: There are no aggressive appearing lytic or blastic lesions noted in the visualized portions of the  skeleton. Review of the MIP images confirms the above findings. IMPRESSION: 1. No evidence of pulmonary embolism. 2. No acute findings noted in the thorax. 3. Numerous bilateral pulmonary nodules stable compared to prior examination from 02/27/2017. These are of uncertain etiology and significance, but their stability over time could suggest a benign etiology unless the patient is undergoing active treatment for malignancy. One consideration is that of diffuse idiopathic neuroendocrine cell hyperplasia (DIPNECH). Continued attention on followup studies is recommended. Outpatient referral to pulmonology for further evaluation should also be considered if not already obtained. Electronically Signed   By: Vinnie Langton M.D.   On: 05/17/2017 18:32        Scheduled Meds: . [START ON 05/19/2017] azithromycin  250 mg Oral Daily  .  enoxaparin (LOVENOX) injection  30 mg Subcutaneous Q24H  . insulin aspart  0-5 Units Subcutaneous QHS  . insulin aspart  0-9 Units Subcutaneous TID WC  . ipratropium-albuterol  3 mL Nebulization Q6H  . mouth rinse  15 mL Mouth Rinse BID  . predniSONE  40 mg Oral Q breakfast   Continuous Infusions:   LOS: 1 day    Time spent: 22 minutes    GARBA,LAWAL, MD [facility] Pager [phone] If 7PM-7AM, please contact night-coverage [email or web address] Password [facility] 05/18/2017, 8:27 PM

## 2017-05-19 LAB — CBC
HCT: 36.1 % (ref 36.0–46.0)
Hemoglobin: 10.8 g/dL — ABNORMAL LOW (ref 12.0–15.0)
MCH: 29.5 pg (ref 26.0–34.0)
MCHC: 29.9 g/dL — ABNORMAL LOW (ref 30.0–36.0)
MCV: 98.6 fL (ref 78.0–100.0)
PLATELETS: 354 10*3/uL (ref 150–400)
RBC: 3.66 MIL/uL — AB (ref 3.87–5.11)
RDW: 14.2 % (ref 11.5–15.5)
WBC: 11.3 10*3/uL — ABNORMAL HIGH (ref 4.0–10.5)

## 2017-05-19 LAB — BASIC METABOLIC PANEL
ANION GAP: 9 (ref 5–15)
BUN: 47 mg/dL — ABNORMAL HIGH (ref 6–20)
CALCIUM: 8.5 mg/dL — AB (ref 8.9–10.3)
CO2: 22 mmol/L (ref 22–32)
Chloride: 107 mmol/L (ref 101–111)
Creatinine, Ser: 1.59 mg/dL — ABNORMAL HIGH (ref 0.44–1.00)
GFR calc Af Amer: 35 mL/min — ABNORMAL LOW (ref >60)
GFR, EST NON AFRICAN AMERICAN: 30 mL/min — AB (ref >60)
Glucose, Bld: 193 mg/dL — ABNORMAL HIGH (ref 65–99)
Potassium: 5.9 mmol/L — ABNORMAL HIGH (ref 3.5–5.1)
Sodium: 138 mmol/L (ref 135–145)

## 2017-05-19 LAB — GLUCOSE, CAPILLARY
GLUCOSE-CAPILLARY: 136 mg/dL — AB (ref 65–99)
GLUCOSE-CAPILLARY: 116 mg/dL — AB (ref 65–99)
GLUCOSE-CAPILLARY: 247 mg/dL — AB (ref 65–99)
Glucose-Capillary: 301 mg/dL — ABNORMAL HIGH (ref 65–99)

## 2017-05-19 MED ORDER — IPRATROPIUM-ALBUTEROL 0.5-2.5 (3) MG/3ML IN SOLN
3.0000 mL | Freq: Three times a day (TID) | RESPIRATORY_TRACT | Status: DC
  Administered 2017-05-19 – 2017-05-20 (×4): 3 mL via RESPIRATORY_TRACT
  Filled 2017-05-19 (×4): qty 3

## 2017-05-19 MED ORDER — CALCIUM CARBONATE-VITAMIN D 500-200 MG-UNIT PO TABS
1.0000 | ORAL_TABLET | Freq: Every day | ORAL | Status: DC
  Administered 2017-05-19 – 2017-05-21 (×3): 1 via ORAL
  Filled 2017-05-19 (×3): qty 1

## 2017-05-19 NOTE — Progress Notes (Signed)
Patient ID: Heidi Moran, female   DOB: [birth year], 82 y.o.   MRN: [id number]  PROGRESS NOTE    Heidi Moran  TIW:[id number] DOB: March 12, 1936 DOA: 05/17/2017 PCP: Heidi Bunting, MD    Brief Narrative: Patient 82 year old female with known history of pulmonary nodules and diabetes with no prior diagnosis of COPD presenting with bronchospasm and hypoxemia oxygen 85%. Patient also has significant upper tract infection lately. She did not get any flu swabs. She's had nasal congestion and sneezing. Patient was afebrile except for the bronchospasm and hypoxemia. Patient continues to require oxygen.  Assessment & Plan:   Active Problems:   Acute respiratory failure with hypoxia (HCC)   DM (diabetes mellitus) type 2, uncontrolled, with ketoacidosis (HCC)   HTN (hypertension)   Hyperlipidemia   #1 acute respiratory failure with hypoxemia: Probably due to new bronchospasm. She is breathing better with no wheezes today, however she continues to be weak and required oxygen at 2 L No evidence of pneumonia. Patient may not require long-term oxygen. We might get her home with bronchodilators once she is stable. Continue empiric Zithromax  #2 hypertension: We will continue home blood pressure medications. Continue home lisinopril  #3 diabetes: Blood sugar so far controlled. Continue home regimen of glyburide metformin with sliding scale insulin  #4 hyperlipidemia: Continue Lipitor  #5 anemia of chronic disease: Continue monitoring.  #6 generalized weakness: Physical therapy and occupational therapy.  Patient may require short-term rehabilitation.  #7 hyperkalemia: Kayexalate given.  Recheck potassium   DVT prophylaxis: Lovenox Code Status: Full code Family Communication: Sister at bedside Disposition Plan: Home with family  Consultants:   None  Procedures: None  Antimicrobials:  -Azithromycin  Subjective: Patient is feeling much better today. Still slightly hypoxic. Mild expiratory  wheezing  Objective: Vitals:   05/18/17 2111 05/18/17 2332 05/19/17 0142 05/19/17 0430  BP:  (!) 107/41  96/63  Pulse: (!) 102 (!) 109  94  Resp: 19 15  17   Temp: 99.8 F (37.7 C)   98.3 F (36.8 C)  TempSrc: Axillary   Axillary  SpO2: 90% 90% 95% 99%  Weight:      Height:        Intake/Output Summary (Last 24 hours) at 05/19/2017 0749 Last data filed at 05/19/2017 0430 Gross per 24 hour  Intake 320 ml  Output 550 ml  Net -230 ml   Filed Weights   05/17/17 1516 05/17/17 2246  Weight: 50.8 kg (112 lb) 52.3 kg (115 lb 3.2 oz)    Examination:  General exam: Appears calm and comfortable  Respiratory system: Patient has mild expiratory wheezing no crackles. Good air entry bilaterally Cardiovascular system: S1 & S2 heard, RRR. No JVD, murmurs, rubs, gallops or clicks. No pedal edema. Gastrointestinal system: Abdomen is nondistended, soft and nontender. No organomegaly or masses felt. Normal bowel sounds heard. Central nervous system: Alert and oriented. No focal neurological deficits. Extremities: Symmetric 5 x 5 power. Skin: No rashes, lesions or ulcers Psychiatry: Judgement and insight appear normal. Mood & affect appropriate.     Data Reviewed: I have personally reviewed following labs and imaging studies  CBC: Recent Labs  Lab 05/17/17 1530 05/18/17 0431 05/19/17 0304  WBC 13.5* 11.2* 11.3*  HGB 12.3 10.9* 10.8*  HCT 38.5 34.5* 36.1  MCV 94.1 95.3 98.6  PLT 395 331 250   Basic Metabolic Panel: Recent Labs  Lab 05/17/17 1530 05/18/17 0431  NA 132* 135  K 5.2* 5.3*  CL 96* 104  CO2 23 23  GLUCOSE 186* 161*  BUN 39* 30*  CREATININE 1.26* 0.95  CALCIUM 8.7* 8.1*   GFR: Estimated Creatinine Clearance: 36.2 mL/min (by C-G formula based on SCr of 0.95 mg/dL). Liver Function Tests: Recent Labs  Lab 05/17/17 1530  AST 36  ALT 35  ALKPHOS 81  BILITOT 0.6  PROT 7.0  ALBUMIN 3.4*   Recent Labs  Lab 05/17/17 1530  LIPASE 35   No results for  input(s): AMMONIA in the last 168 hours. Coagulation Profile: No results for input(s): INR, PROTIME in the last 168 hours. Cardiac Enzymes: No results for input(s): CKTOTAL, CKMB, CKMBINDEX, TROPONINI in the last 168 hours. BNP (last 3 results) No results for input(s): PROBNP in the last 8760 hours. HbA1C: No results for input(s): HGBA1C in the last 72 hours. CBG: Recent Labs  Lab 05/18/17 0606 05/18/17 1145 05/18/17 1645 05/18/17 2109 05/19/17 0608  GLUCAP 155* 100* 89 200* 136*   Lipid Profile: No results for input(s): CHOL, HDL, LDLCALC, TRIG, CHOLHDL, LDLDIRECT in the last 72 hours. Thyroid Function Tests: No results for input(s): TSH, T4TOTAL, FREET4, T3FREE, THYROIDAB in the last 72 hours. Anemia Panel: No results for input(s): VITAMINB12, FOLATE, FERRITIN, TIBC, IRON, RETICCTPCT in the last 72 hours. Urine analysis:    Component Value Date/Time   COLORURINE YELLOW 05/18/2017 0641   APPEARANCEUR CLOUDY (A) 05/18/2017 0641   LABSPEC 1.026 05/18/2017 0641   PHURINE 5.0 05/18/2017 0641   GLUCOSEU 50 (A) 05/18/2017 0641   HGBUR MODERATE (A) 05/18/2017 0641   BILIRUBINUR NEGATIVE 05/18/2017 0641   KETONESUR 5 (A) 05/18/2017 0641   PROTEINUR 30 (A) 05/18/2017 0641   UROBILINOGEN 0.2 01/28/2017 1310   NITRITE NEGATIVE 05/18/2017 0641   LEUKOCYTESUR TRACE (A) 05/18/2017 0641   Sepsis Labs: @LABRCNTIP (procalcitonin:4,lacticidven:4)  ) Recent Results (from the past 240 hour(s))  Respiratory Panel by PCR     Status: None   Collection Time: 05/17/17  5:38 PM  Result Value Ref Range Status   Adenovirus NOT DETECTED NOT DETECTED Final   Coronavirus 229E NOT DETECTED NOT DETECTED Final   Coronavirus HKU1 NOT DETECTED NOT DETECTED Final   Coronavirus NL63 NOT DETECTED NOT DETECTED Final   Coronavirus OC43 NOT DETECTED NOT DETECTED Final   Metapneumovirus NOT DETECTED NOT DETECTED Final   Rhinovirus / Enterovirus NOT DETECTED NOT DETECTED Final   Influenza A NOT DETECTED  NOT DETECTED Final   Influenza B NOT DETECTED NOT DETECTED Final   Parainfluenza Virus 1 NOT DETECTED NOT DETECTED Final   Parainfluenza Virus 2 NOT DETECTED NOT DETECTED Final   Parainfluenza Virus 3 NOT DETECTED NOT DETECTED Final   Parainfluenza Virus 4 NOT DETECTED NOT DETECTED Final   Respiratory Syncytial Virus NOT DETECTED NOT DETECTED Final   Bordetella pertussis NOT DETECTED NOT DETECTED Final   Chlamydophila pneumoniae NOT DETECTED NOT DETECTED Final   Mycoplasma pneumoniae NOT DETECTED NOT DETECTED Final    Comment: Performed at [facility], [place]. [address] [place], [place] [address]  Blood culture (routine x 2)     Status: None (Preliminary result)   Collection Time: 05/17/17  6:59 PM  Result Value Ref Range Status   Specimen Description BLOOD LEFT ANTECUBITAL  Final   Special Requests   Final    BOTTLES DRAWN AEROBIC AND ANAEROBIC Blood Culture adequate volume   Culture   Final    NO GROWTH < 24 HOURS Performed at [facility], [place] [address] [place], [place] [address]    Report Status PENDING  Incomplete  Blood culture (routine x 2)     Status: None (Preliminary result)   Collection Time: 05/17/17  7:08 PM  Result Value Ref Range Status   Specimen Description BLOOD LEFT HAND  Final   Special Requests IN PEDIATRIC BOTTLE Blood Culture adequate volume  Final   Culture   Final    NO GROWTH < 24 HOURS Performed at [facility], [place] [address] [place], [place] [address]    Report Status PENDING  Incomplete  MRSA PCR Screening     Status: None   Collection Time: 05/17/17 11:26 PM  Result Value Ref Range Status   MRSA by PCR NEGATIVE NEGATIVE Final    Comment:        The GeneXpert MRSA Assay (FDA approved for NASAL specimens only), is one component of a comprehensive MRSA colonization surveillance program. It is not intended to diagnose MRSA infection nor to guide or monitor treatment for MRSA infections. Performed at [facility],  [place] [address], [place], [place] [address]          Radiology Studies: Dg Chest 2 View  Result Date: 05/17/2017 CLINICAL DATA:  Generalized weakness. EXAM: CHEST  2 VIEW COMPARISON:  Radiographs of January 28, 2017. PET scan of February 27, 2017. FINDINGS: The heart size and mediastinal contours are within normal limits. No pneumothorax or pleural effusion is noted. There are again noted multiple nodules in the right lung, as described on prior chest radiograph and PET scan. No new consolidative process is noted. The visualized skeletal structures are unremarkable. IMPRESSION: Stable right lung nodules are noted as described on prior chest radiograph in PET scan. Malignancy cannot be excluded. Electronically Signed   By: Marijo Conception, M.D.   On: 05/17/2017 16:04   Ct Angio Chest Pe W And/or Wo Contrast  Result Date: 05/17/2017 CLINICAL DATA:  82 year old female with history of shortness of breath over the past couple weeks. EXAM: CT ANGIOGRAPHY CHEST WITH CONTRAST TECHNIQUE: Multidetector CT imaging of the chest was performed using the standard protocol during bolus administration of intravenous contrast. Multiplanar CT image reconstructions and MIPs were obtained to evaluate the vascular anatomy. CONTRAST:  48mL ISOVUE-370 IOPAMIDOL (ISOVUE-370) INJECTION 76% COMPARISON:  PET-CT 02/27/2017. FINDINGS: Cardiovascular: No filling defect in the pulmonary artery tree to suggest underlying pulmonary embolism. Heart size is borderline enlarged. There is no significant pericardial fluid, thickening or pericardial calcification. Mediastinum/Nodes: Multiple prominent borderline enlarged mediastinal and bilateral hilar lymph nodes measuring up to 12 mm in short axis. Esophagus is unremarkable in appearance. No axillary lymphadenopathy. Lungs/Pleura: Again noted are numerous pulmonary nodules scattered throughout the lungs bilaterally, similar in size, number and distribution to prior PET-CT 02/27/2017, largest of  which is a 13 mm nodule in the lateral segment of the right middle lobe (axial image 91 of series 6). Several of these nodules have some surrounding ground-glass attenuation. No acute consolidative airspace disease. No pleural effusions. Upper Abdomen: Aortic atherosclerosis. Musculoskeletal: There are no aggressive appearing lytic or blastic lesions noted in the visualized portions of the skeleton. Review of the MIP images confirms the above findings. IMPRESSION: 1. No evidence of pulmonary embolism. 2. No acute findings noted in the thorax. 3. Numerous bilateral pulmonary nodules stable compared to prior examination from 02/27/2017. These are of uncertain etiology and significance, but their stability over time could suggest a benign etiology unless the patient is undergoing active treatment for malignancy. One consideration is that of diffuse idiopathic neuroendocrine cell hyperplasia (DIPNECH).  Continued attention on followup studies is recommended. Outpatient referral to pulmonology for further evaluation should also be considered if not already obtained. Electronically Signed   By: Vinnie Langton M.D.   On: 05/17/2017 18:32        Scheduled Meds: . atorvastatin  20 mg Oral Daily  . azithromycin  250 mg Oral Daily  . calcium-vitamin D  1 tablet Oral Q breakfast  . enoxaparin (LOVENOX) injection  30 mg Subcutaneous Q24H  . insulin aspart  0-5 Units Subcutaneous QHS  . insulin aspart  0-9 Units Subcutaneous TID WC  . ipratropium-albuterol  3 mL Nebulization TID  . mouth rinse  15 mL Mouth Rinse BID  . predniSONE  40 mg Oral Q breakfast   Continuous Infusions:   LOS: 2 days    Time spent: 22 minutes    Nur Krasinski,LAWAL, MD [facility] Pager [phone] If 7PM-7AM, please contact night-coverage [email or web address] Password [facility] 05/19/2017, 7:49 AM

## 2017-05-20 LAB — GLUCOSE, CAPILLARY
GLUCOSE-CAPILLARY: 465 mg/dL — AB (ref 65–99)
Glucose-Capillary: 110 mg/dL — ABNORMAL HIGH (ref 65–99)
Glucose-Capillary: 216 mg/dL — ABNORMAL HIGH (ref 65–99)
Glucose-Capillary: 295 mg/dL — ABNORMAL HIGH (ref 65–99)

## 2017-05-20 LAB — BASIC METABOLIC PANEL
ANION GAP: 5 (ref 5–15)
BUN: 42 mg/dL — ABNORMAL HIGH (ref 6–20)
CALCIUM: 9.5 mg/dL (ref 8.9–10.3)
CO2: 28 mmol/L (ref 22–32)
Chloride: 109 mmol/L (ref 101–111)
Creatinine, Ser: 1.06 mg/dL — ABNORMAL HIGH (ref 0.44–1.00)
GFR, EST AFRICAN AMERICAN: 56 mL/min — AB (ref >60)
GFR, EST NON AFRICAN AMERICAN: 49 mL/min — AB (ref >60)
Glucose, Bld: 160 mg/dL — ABNORMAL HIGH (ref 65–99)
Potassium: 5.3 mmol/L — ABNORMAL HIGH (ref 3.5–5.1)
Sodium: 142 mmol/L (ref 135–145)

## 2017-05-20 LAB — CBC
HCT: 33.4 % — ABNORMAL LOW (ref 36.0–46.0)
Hemoglobin: 10.2 g/dL — ABNORMAL LOW (ref 12.0–15.0)
MCH: 29.5 pg (ref 26.0–34.0)
MCHC: 30.5 g/dL (ref 30.0–36.0)
MCV: 96.5 fL (ref 78.0–100.0)
PLATELETS: 358 10*3/uL (ref 150–400)
RBC: 3.46 MIL/uL — AB (ref 3.87–5.11)
RDW: 13.8 % (ref 11.5–15.5)
WBC: 10.2 10*3/uL (ref 4.0–10.5)

## 2017-05-20 LAB — GLUCOSE, RANDOM: Glucose, Bld: 397 mg/dL — ABNORMAL HIGH (ref 65–99)

## 2017-05-20 MED ORDER — IPRATROPIUM-ALBUTEROL 0.5-2.5 (3) MG/3ML IN SOLN: 3.0000 mL | RESPIRATORY_TRACT | Status: DC | PRN

## 2017-05-20 MED ORDER — SODIUM POLYSTYRENE SULFONATE PO POWD
15.0000 g | Freq: Once | ORAL | Status: DC
  Filled 2017-05-20: qty 15

## 2017-05-20 MED ORDER — SODIUM POLYSTYRENE SULFONATE 15 GM/60ML PO SUSP
15.0000 g | Freq: Once | ORAL | Status: DC
  Filled 2017-05-20: qty 60

## 2017-05-20 NOTE — Evaluation (Signed)
Physical Therapy Evaluation Patient Details Name: Heidi Moran MRN: [id number] DOB: Jun 29, 1935 Today's Date: 05/20/2017   History of Present Illness  Pt adm with acute hypoxic respiratory failure due to bronchospasm. PMH - DM, HTN  Clinical Impression  Pt admitted with above diagnosis and presents to PT with functional limitations due to deficits listed below (See PT problem list). Pt needs skilled PT to maximize independence and safety to allow discharge to ST-SNF for further rehab before return home. Expect pt will make steady progress.     Follow Up Recommendations SNF    Equipment Recommendations  Other (comment)(To be assessed)    Recommendations for Other Services       Precautions / Restrictions Precautions Precautions: Fall Restrictions Weight Bearing Restrictions: No      Mobility  Bed Mobility               General bed mobility comments: Pt sitting EOB  Transfers Overall transfer level: Needs assistance Equipment used: Rolling walker (2 wheeled) Transfers: Sit to/from Stand Sit to Stand: Min assist         General transfer comment: Assist for balance. Verbal cues for hand placement  Ambulation/Gait Ambulation/Gait assistance: Min assist Ambulation Distance (Feet): 175 Feet Assistive device: Rolling walker (2 wheeled);None Gait Pattern/deviations: Step-through pattern;Decreased step length - right;Decreased step length - left;Shuffle;Drifts right/left;Trunk flexed Gait velocity: decr Gait velocity interpretation: <1.8 ft/sec, indicative of risk for recurrent falls General Gait Details: Assist for balance and support and to manage walker. Repeated verbal cues to not step past the walker. Repeated assist to steer the walker. SpO2 84% on RA with amb. SpO2 >90% on 2L of O2.  Stairs            Wheelchair Mobility    Modified Rankin (Stroke Patients Only)       Balance Overall balance assessment: Needs assistance Sitting-balance support: No  upper extremity supported;Feet supported Sitting balance-Leahy Scale: Good     Standing balance support: No upper extremity supported;During functional activity Standing balance-Leahy Scale: Fair                               Pertinent Vitals/Pain Pain Assessment: No/denies pain    Home Living Family/patient expects to be discharged to:: Private residence Living Arrangements: Spouse/significant other Available Help at Discharge: Family;Available PRN/intermittently Type of Home: House Home Access: Stairs to enter Entrance Stairs-Rails: None Entrance Stairs-Number of Steps: 3 Home Layout: Two level;Bed/bath upstairs Home Equipment: None      Prior Function Level of Independence: Independent               Hand Dominance        Extremity/Trunk Assessment   Upper Extremity Assessment Upper Extremity Assessment: Defer to OT evaluation    Lower Extremity Assessment Lower Extremity Assessment: Generalized weakness       Communication   Communication: No difficulties  Cognition Arousal/Alertness: Awake/alert Behavior During Therapy: Flat affect Overall Cognitive Status: Impaired/Different from baseline Area of Impairment: Problem solving                             Problem Solving: Slow processing;Requires verbal cues;Requires tactile cues        General Comments      Exercises     Assessment/Plan    PT Assessment Patient needs continued PT services  PT Problem List Decreased strength;Decreased activity tolerance;Decreased balance;Decreased mobility;Decreased  knowledge of use of DME;Decreased cognition       PT Treatment Interventions DME instruction;Gait training;Stair training;Functional mobility training;Therapeutic activities;Therapeutic exercise;Balance training;Cognitive remediation;Patient/family education    PT Goals (Current goals can be found in the Care Plan section)  Acute Rehab PT Goals Patient Stated Goal: get  home to see cats PT Goal Formulation: With patient/family Time For Goal Achievement: 06/03/17 Potential to Achieve Goals: Good    Frequency Min 3X/week   Barriers to discharge Inaccessible home environment;Decreased caregiver support Stairs to enter and to access bedroom. Spouse out at times.    Co-evaluation               AM-PAC PT "6 Clicks" Daily Activity  Outcome Measure Difficulty turning over in bed (including adjusting bedclothes, sheets and blankets)?: A Little Difficulty moving from lying on back to sitting on the side of the bed? : A Little Difficulty sitting down on and standing up from a chair with arms (e.g., wheelchair, bedside commode, etc,.)?: Unable Help needed moving to and from a bed to chair (including a wheelchair)?: A Little Help needed walking in hospital room?: A Little Help needed climbing 3-5 steps with a railing? : A Lot 6 Click Score: 15    End of Session Equipment Utilized During Treatment: Oxygen Activity Tolerance: Patient limited by fatigue Patient left: in chair;with call bell/phone within reach;with bed alarm set;with family/visitor present Nurse Communication: Mobility status PT Visit Diagnosis: Unsteadiness on feet (R26.81);Other abnormalities of gait and mobility (R26.89);Muscle weakness (generalized) (M62.81)    Time: 1011-1040 PT Time Calculation (min) (ACUTE ONLY): 29 min   Charges:   PT Evaluation $PT Eval Moderate Complexity: 1 Mod PT Treatments $Gait Training: 8-22 mins   PT G Codes:        [facility] PT [place] 05/20/2017, 11:04 AM

## 2017-05-20 NOTE — Clinical Social Work Note (Signed)
Clinical Social Work Assessment  Patient Details  Name: Heidi Moran MRN: [id number] Date of Birth: September 30, 1935  Date of referral:  05/20/17               Reason for consult:  Discharge Planning, Facility Placement                Permission sought to share information with:  Family Supports Permission granted to share information::  Yes, Release of Information Signed  Name::     Romie Keeble  Agency::  snf  Relationship::  spouse  Contact Information:  [phone]  Housing/Transportation Living arrangements for the past 2 months:  Single Family Home Source of Information:  Patient, Spouse Patient Interpreter Needed:  None Criminal Activity/Legal Involvement Pertinent to Current Situation/Hospitalization:  No - Comment as needed Significant Relationships:  Spouse, Other Family Members Lives with:  Spouse Do you feel safe going back to the place where you live?  Yes Need for family participation in patient care:  Yes (Comment)  Care giving concerns:  Patient's spouse at bedside. Patient has family and friends for support   Social Worker assessment / plan:  Holiday representative met patient and spouse at bedside. Patient and spouse stated they are in agreement to go to short term rehab. Patient stated she is willing to go to snf but would like her pcp to be aware of everything going on in the hospital. CSW to follow up with family once bed offers are available    Employment status:  Retired Forensic scientist:  Medicare PT Recommendations:  [place] / Referral to community resources:  [place]  Patient/Family's Response to care:  Spouse supportive of patients and her needs  Patient/Family's Understanding of and Emotional Response to Diagnosis, Current Treatment, and Prognosis: Patient willing to discharge to facility   Emotional Assessment Appearance:  Appears stated age Attitude/Demeanor/Rapport:  Other Affect (typically  observed):  Accepting Orientation:  Oriented to Self, Oriented to Place, Oriented to Situation, Oriented to  Time Alcohol / Substance use:  Not Applicable Psych involvement (Current and /or in the community):  No (Comment)  Discharge Needs  Concerns to be addressed:  No discharge needs identified Readmission within the last 30 days:  No Current discharge risk:  None Barriers to Discharge:  No Barriers Identified   Wende Neighbors, LCSW 05/20/2017, 2:02 PM

## 2017-05-20 NOTE — Progress Notes (Signed)
Occupational Therapy Evaluation Patient Details Name: Heidi Moran MRN: [id number] DOB: 1935/04/17 Today's Date: 05/20/2017    History of Present Illness Pt adm with acute hypoxic respiratory failure due to bronchospasm. PMH - DM, HTN   Clinical Impression   PTA, pt independent with mobility and ADL, drove and was active in her church. Pt currently requires min HHA with mobility and min guard A with ADL tasks. Pt is improving cognitively, although demonstrates slow processing. Pt desats to 86 on RA during activity. Once placed on 2L, O2 returns to 96; HR 87. Given pt's PLOF, feel pt would be appropriate for home health with program like [facility] if eligible. Left message for SW Caryl Pina). Will follow acutely to facilitate safe DC to next venue of care.     Follow Up Recommendations  Supervision/Assistance - 24 hour ; HHOT ( if eligible for [facility])   Equipment Recommendations  3 in 1 bedside commode    Recommendations for Other Services       Precautions / Restrictions Precautions Precautions: Fall Restrictions Weight Bearing Restrictions: No      Mobility Bed Mobility               General bed mobility comments: Pt sitting IN RECLINER  Transfers Overall transfer level: Needs assistance Equipment used: 1 person hand held assist Transfers: Sit to/from Stand Sit to Stand: Min guard         General transfer comment: Assist for balance. Verbal cues for hand placement    Balance Overall balance assessment: Needs assistance Sitting-balance support: No upper extremity supported;Feet supported Sitting balance-Leahy Scale: Good     Standing balance support: No upper extremity supported;During functional activity Standing balance-Leahy Scale: Fair                             ADL either performed or assessed with clinical judgement   ADL Overall ADL's : Needs assistance/impaired     Grooming: Supervision/safety;Standing   Upper Body Bathing: Set  up;Supervision/ safety;Sitting   Lower Body Bathing: Min guard;Sit to/from stand   Upper Body Dressing : Set up;Supervision/safety;Sitting   Lower Body Dressing: Min guard;Sit to/from stand   Toilet Transfer: Minimal assistance(HHA)   Toileting- Clothing Manipulation and Hygiene: Supervision/safety;Sit to/from stand       Functional mobility during ADLs: Minimal assistance       Vision         Perception     Praxis      Pertinent Vitals/Pain Pain Assessment: No/denies pain     Hand Dominance Right   Extremity/Trunk Assessment Upper Extremity Assessment Upper Extremity Assessment: Generalized weakness   Lower Extremity Assessment Lower Extremity Assessment: Defer to PT evaluation   Cervical / Trunk Assessment Cervical / Trunk Assessment: Normal   Communication Communication Communication: No difficulties   Cognition Arousal/Alertness: Awake/alert Behavior During Therapy: Flat affect Overall Cognitive Status: Impaired/Different from baseline Area of Impairment: Memory;Attention;Awareness                   Current Attention Level: Selective Memory: Decreased short-term memory     Awareness: Emergent Problem Solving: Slow processing;Requires verbal cues     General Comments       Exercises     Shoulder Instructions      Home Living Family/patient expects to be discharged to:: Private residence Living Arrangements: Spouse/significant other Available Help at Discharge: Family;Available PRN/intermittently Type of Home: House Home Access: Stairs to enter  Entrance Stairs-Number of Steps: 3 Entrance Stairs-Rails: None Home Layout: Two level;Bed/bath upstairs Alternate Level Stairs-Number of Steps: flight   Bathroom Shower/Tub: Tub/shower unit;Walk-in shower   Bathroom Toilet: Standard Bathroom Accessibility: Yes How Accessible: Accessible via walker Home Equipment: None          Prior Functioning/Environment Level of Independence:  Independent        Comments: active with church        OT Problem List: Decreased strength;Decreased activity tolerance;Impaired balance (sitting and/or standing);Decreased cognition;Decreased safety awareness;Decreased knowledge of use of DME or AE;Cardiopulmonary status limiting activity      OT Treatment/Interventions: Self-care/ADL training;Therapeutic exercise;Energy conservation;DME and/or AE instruction;Therapeutic activities;Cognitive remediation/compensation;Patient/family education;Balance training    OT Goals(Current goals can be found in the care plan section) Acute Rehab OT Goals Patient Stated Goal: get home to see cats OT Goal Formulation: With patient/family Time For Goal Achievement: 06/03/17 Potential to Achieve Goals: Good  OT Frequency: Min 2X/week   Barriers to D/C:            Co-evaluation              AM-PAC PT "6 Clicks" Daily Activity     Outcome Measure Help from another person eating meals?: None Help from another person taking care of personal grooming?: A Little Help from another person toileting, which includes using toliet, bedpan, or urinal?: A Little Help from another person bathing (including washing, rinsing, drying)?: A Little Help from another person to put on and taking off regular upper body clothing?: A Little Help from another person to put on and taking off regular lower body clothing?: A Little 6 Click Score: 19   End of Session Equipment Utilized During Treatment: Gait belt;Oxygen(2l) Nurse Communication: Mobility status  Activity Tolerance: Patient tolerated treatment well Patient left: in chair;with call bell/phone within reach;with chair alarm set;with family/visitor present  OT Visit Diagnosis: Unsteadiness on feet (R26.81);Muscle weakness (generalized) (M62.81);Other symptoms and signs involving cognitive function                Time: 1351-1415 OT Time Calculation (min): 24 min Charges:  OT General Charges $OT  Visit: 1 Visit OT Evaluation $OT Eval Moderate Complexity: 1 Mod OT Treatments $Self Care/Home Management : 8-22 mins G-Codes:     [facility], OT/L  [id number] 05/20/2017  Jermario Kalmar,HILLARY 05/20/2017, 2:46 PM

## 2017-05-20 NOTE — Progress Notes (Signed)
CBG 465 pre orders Stat Glucose order and results back at 22:6 397 will cover with S.S. Insulin per orders. R.N. aware

## 2017-05-20 NOTE — Progress Notes (Signed)
Patient ID: Heidi Moran, female   DOB: January 09, 1936, 82 y.o.   MRN: [id number]  PROGRESS NOTE    Heidi Moran  PIR:[id number] DOB: [birth year] DOA: 05/17/2017 PCP: Burnard Bunting, MD    Brief Narrative: Patient 82 year old female with known history of pulmonary nodules and diabetes with no prior diagnosis of COPD presenting with bronchospasm and hypoxemia oxygen 85%. Patient also has significant upper tract infection lately. She did not get any flu swabs. She's had nasal congestion and sneezing. Patient was afebrile except for the bronchospasm and hypoxemia. Patient continues to require oxygen.  Assessment & Plan:   Active Problems:   Acute respiratory failure with hypoxia (HCC)   DM (diabetes mellitus) type 2, uncontrolled, with ketoacidosis (HCC)   HTN (hypertension)   Hyperlipidemia   #1 acute respiratory failure with hypoxemia: Patient is already doing much better today. She walked with physical therapy but still requiring oxygen. Mild bilateral extremity wheezing. No evidence of pneumonia. Patient may not require long-term oxygen. We might get her home with bronchodilators once she is stable. Continue empiric Zithromax  #2 hypertension: We will continue home blood pressure medications. Continue home lisinopril  #3 diabetes: Blood sugar so far controlled. Continue home regimen of glyburide metformin with sliding scale insulin  #4 hyperlipidemia: Continue Lipitor  #5 anemia of chronic disease: Continue monitoring.  #6 generalized weakness: Physical therapy and occupational therapy.  Patient will require short-term rehabilitation. We may be able to transfer her tomorrow.  #7 hyperkalemia: Kayexalate given.  Recheck potassium   DVT prophylaxis: Lovenox Code Status: Full code Family Communication: Sister at bedside Disposition Plan: Home with family  Consultants:   None  Procedures: None  Antimicrobials:  -Azithromycin  Subjective: Patient is feeling much better today.  Still slightly hypoxic. Mild expiratory wheezing. Sitting in the chair unable to participate in physical therapy  Objective: Vitals:   05/20/17 0500 05/20/17 0700 05/20/17 0900 05/20/17 0904  BP:      Pulse: 81 78 87   Resp: 15 17 19    Temp:      TempSrc:      SpO2: 98% 93% 98% 99%  Weight:      Height:        Intake/Output Summary (Last 24 hours) at 05/20/2017 1301 Last data filed at 05/20/2017 0900 Gross per 24 hour  Intake 810 ml  Output 1350 ml  Net -540 ml   Filed Weights   05/17/17 1516 05/17/17 2246  Weight: 50.8 kg (112 lb) 52.3 kg (115 lb 3.2 oz)    Examination:  General exam: Appears calm and comfortable  Respiratory system: Patient has mild expiratory wheezing no crackles. Good air entry bilaterally Cardiovascular system: S1 & S2 heard, RRR. No JVD, murmurs, rubs, gallops or clicks. No pedal edema. Gastrointestinal system: Abdomen is nondistended, soft and nontender. No organomegaly or masses felt. Normal bowel sounds heard. Central nervous system: Alert and oriented. No focal neurological deficits. Extremities: Symmetric 5 x 5 power. Skin: No rashes, lesions or ulcers Psychiatry: Judgement and insight appear normal. Mood & affect appropriate.     Data Reviewed: I have personally reviewed following labs and imaging studies  CBC: Recent Labs  Lab 05/17/17 1530 05/18/17 0431 05/19/17 0304 05/20/17 0225  WBC 13.5* 11.2* 11.3* 10.2  HGB 12.3 10.9* 10.8* 10.2*  HCT 38.5 34.5* 36.1 33.4*  MCV 94.1 95.3 98.6 96.5  PLT 395 331 354 630   Basic Metabolic Panel: Recent Labs  Lab 05/17/17 1530 05/18/17 0431 05/19/17 0304 05/20/17 1601  NA 132* 135 138 142  K 5.2* 5.3* 5.9* 5.3*  CL 96* 104 107 109  CO2 23 23 22 28   GLUCOSE 186* 161* 193* 160*  BUN 39* 30* 47* 42*  CREATININE 1.26* 0.95 1.59* 1.06*  CALCIUM 8.7* 8.1* 8.5* 9.5   GFR: Estimated Creatinine Clearance: 32.5 mL/min (A) (by C-G formula based on SCr of 1.06 mg/dL (H)). Liver Function  Tests: Recent Labs  Lab 05/17/17 1530  AST 36  ALT 35  ALKPHOS 81  BILITOT 0.6  PROT 7.0  ALBUMIN 3.4*   Recent Labs  Lab 05/17/17 1530  LIPASE 35   No results for input(s): AMMONIA in the last 168 hours. Coagulation Profile: No results for input(s): INR, PROTIME in the last 168 hours. Cardiac Enzymes: No results for input(s): CKTOTAL, CKMB, CKMBINDEX, TROPONINI in the last 168 hours. BNP (last 3 results) No results for input(s): PROBNP in the last 8760 hours. HbA1C: No results for input(s): HGBA1C in the last 72 hours. CBG: Recent Labs  Lab 05/19/17 1122 05/19/17 1609 05/19/17 2116 05/20/17 0614 05/20/17 1120  GLUCAP 116* 247* 301* 110* 216*   Lipid Profile: No results for input(s): CHOL, HDL, LDLCALC, TRIG, CHOLHDL, LDLDIRECT in the last 72 hours. Thyroid Function Tests: No results for input(s): TSH, T4TOTAL, FREET4, T3FREE, THYROIDAB in the last 72 hours. Anemia Panel: No results for input(s): VITAMINB12, FOLATE, FERRITIN, TIBC, IRON, RETICCTPCT in the last 72 hours. Urine analysis:    Component Value Date/Time   COLORURINE YELLOW 05/18/2017 0641   APPEARANCEUR CLOUDY (A) 05/18/2017 0641   LABSPEC 1.026 05/18/2017 0641   PHURINE 5.0 05/18/2017 0641   GLUCOSEU 50 (A) 05/18/2017 0641   HGBUR MODERATE (A) 05/18/2017 0641   BILIRUBINUR NEGATIVE 05/18/2017 0641   KETONESUR 5 (A) 05/18/2017 0641   PROTEINUR 30 (A) 05/18/2017 0641   UROBILINOGEN 0.2 01/28/2017 1310   NITRITE NEGATIVE 05/18/2017 0641   LEUKOCYTESUR TRACE (A) 05/18/2017 0641   Sepsis Labs: @LABRCNTIP (procalcitonin:4,lacticidven:4)  ) Recent Results (from the past 240 hour(s))  Respiratory Panel by PCR     Status: None   Collection Time: 05/17/17  5:38 PM  Result Value Ref Range Status   Adenovirus NOT DETECTED NOT DETECTED Final   Coronavirus 229E NOT DETECTED NOT DETECTED Final   Coronavirus HKU1 NOT DETECTED NOT DETECTED Final   Coronavirus NL63 NOT DETECTED NOT DETECTED Final    Coronavirus OC43 NOT DETECTED NOT DETECTED Final   Metapneumovirus NOT DETECTED NOT DETECTED Final   Rhinovirus / Enterovirus NOT DETECTED NOT DETECTED Final   Influenza A NOT DETECTED NOT DETECTED Final   Influenza B NOT DETECTED NOT DETECTED Final   Parainfluenza Virus 1 NOT DETECTED NOT DETECTED Final   Parainfluenza Virus 2 NOT DETECTED NOT DETECTED Final   Parainfluenza Virus 3 NOT DETECTED NOT DETECTED Final   Parainfluenza Virus 4 NOT DETECTED NOT DETECTED Final   Respiratory Syncytial Virus NOT DETECTED NOT DETECTED Final   Bordetella pertussis NOT DETECTED NOT DETECTED Final   Chlamydophila pneumoniae NOT DETECTED NOT DETECTED Final   Mycoplasma pneumoniae NOT DETECTED NOT DETECTED Final    Comment: Performed at [facility], [place]. [address], [place], [place] [address]  Blood culture (routine x 2)     Status: None (Preliminary result)   Collection Time: 05/17/17  6:59 PM  Result Value Ref Range Status   Specimen Description BLOOD LEFT ANTECUBITAL  Final   Special Requests   Final    BOTTLES DRAWN AEROBIC AND ANAEROBIC Blood Culture adequate  volume   Culture   Final    NO GROWTH 3 DAYS Performed at [facility], [place] [address] [place], [place] [address]    Report Status PENDING  Incomplete  Blood culture (routine x 2)     Status: None (Preliminary result)   Collection Time: 05/17/17  7:08 PM  Result Value Ref Range Status   Specimen Description BLOOD LEFT HAND  Final   Special Requests IN PEDIATRIC BOTTLE Blood Culture adequate volume  Final   Culture   Final    NO GROWTH 3 DAYS Performed at [facility], [place] [address] [place], [place] [address]    Report Status PENDING  Incomplete  MRSA PCR Screening     Status: None   Collection Time: 05/17/17 11:26 PM  Result Value Ref Range Status   MRSA by PCR NEGATIVE NEGATIVE Final    Comment:        The GeneXpert MRSA Assay (FDA approved for NASAL specimens only), is one component of a comprehensive MRSA  colonization surveillance program. It is not intended to diagnose MRSA infection nor to guide or monitor treatment for MRSA infections. Performed at [facility], [place] [address] [place], [place] [address]          Radiology Studies: No results found.      Scheduled Meds: . atorvastatin  20 mg Oral Daily  . azithromycin  250 mg Oral Daily  . calcium-vitamin D  1 tablet Oral Q breakfast  . enoxaparin (LOVENOX) injection  30 mg Subcutaneous Q24H  . insulin aspart  0-5 Units Subcutaneous QHS  . insulin aspart  0-9 Units Subcutaneous TID WC  . mouth rinse  15 mL Mouth Rinse BID  . predniSONE  40 mg Oral Q breakfast  . sodium polystyrene  15 g Oral Once   Continuous Infusions:   LOS: 3 days    Time spent: 22 minutes    GARBA,LAWAL, MD [facility] Pager [phone] If 7PM-7AM, please contact night-coverage [email or web address] Password [email or web address] 05/20/2017, 1:01 PM

## 2017-05-20 NOTE — Progress Notes (Signed)
Inpatient Diabetes Program Recommendations  AACE/ADA: New Consensus Statement on Inpatient Glycemic Control (2015)  Target Ranges:  Prepandial:   less than 140 mg/dL      Peak postprandial:   less than 180 mg/dL (1-2 hours)      Critically ill patients:  140 - 180 mg/dL   Results for TALAYSHA, FREEBERG (MRN [id number]) as of 05/20/2017 09:22  Ref. Range 05/19/2017 06:08 05/19/2017 11:22 05/19/2017 16:09 05/19/2017 21:16  Glucose-Capillary Latest Ref Range: 65 - 99 mg/dL 136 (H) 116 (H) 247 (H) 301 (H)    Home DM Meds: Glucovance 2.5/500 mg daily  Current Insulin Orders: Novolog Sensitive Correction Scale/ SSI (0-9 units) TID AC + HS      MD- Note patient getting Prednisone 40 mg daily.  Having some afternoon glucose elevations likely due to the Prednisone.  Please consider starting Novolog Meal Coverage while patient getting Prednisone and home oral DM meds are on hold:   Novolog 3 units TID with meals (hold if pt eats <50% of meal)     --Will follow patient during hospitalization--  Wyn Quaker RN, MSN, CDE Diabetes Coordinator Inpatient Glycemic Control Team Team Pager: [phone] (8a-5p)

## 2017-05-20 NOTE — NC FL2 (Signed)
[place] LEVEL OF CARE SCREENING TOOL     IDENTIFICATION  Patient Name: Heidi Moran Birthdate: 06/07/1935 Sex: female Admission Date (Current Location): 05/17/2017  [facility] and [place] Number:  Herbalist and Address:  The . [facility], [place] [address] [place], [place] [address]      Provider Number: [id number]  Attending Physician Name and Address:  Elwyn Reach, MD  Relative Name and Phone Number:  Sherrica Niehaus, [phone]    Current Level of Care: Hospital Recommended Level of Care: [place] Prior Approval Number:    Date Approved/Denied:   PASRR Number: [id number]  Discharge Plan: SNF    Current Diagnoses: Patient Active Problem List   Diagnosis Date Noted  . DM (diabetes mellitus) type 2, uncontrolled, with ketoacidosis ([place]) 05/18/2017  . HTN (hypertension) 05/18/2017  . Hyperlipidemia 05/18/2017  . Acute respiratory failure with hypoxia ([place]) 05/17/2017    Orientation RESPIRATION BLADDER Height & Weight     Self, Time, Situation, Place  O2(2L) Continent Weight: 115 lb 3.2 oz (52.3 kg) Height:  5\' 1"  (154.9 cm)  BEHAVIORAL SYMPTOMS/MOOD NEUROLOGICAL BOWEL NUTRITION STATUS      Continent Diet(Carb modified)  AMBULATORY STATUS COMMUNICATION OF NEEDS Skin   Limited Assist Verbally                         Personal Care Assistance Level of Assistance  Bathing, Feeding, Dressing Bathing Assistance: Limited assistance Feeding assistance: Independent Dressing Assistance: Limited assistance     Functional Limitations Info  Sight, Hearing, Speech Sight Info: Adequate Hearing Info: Adequate Speech Info: Adequate    SPECIAL CARE FACTORS FREQUENCY  PT (By licensed PT), OT (By licensed OT)     PT Frequency: 5x wk OT Frequency: 5x wk            Contractures Contractures Info: Not present    Additional Factors Info  Code Status, Allergies, Insulin Sliding Scale Code Status  Info: Full Code Allergies Info: No Known Allergies   Insulin Sliding Scale Info: novolog       Current Medications (05/20/2017):  This is the current hospital active medication list Current Facility-Administered Medications  Medication Dose Route Frequency Provider Last Rate Last Dose  . atorvastatin (LIPITOR) tablet 20 mg  20 mg Oral Daily Elwyn Reach, MD   20 mg at 05/20/17 0921  . azithromycin (ZITHROMAX) tablet 250 mg  250 mg Oral Daily Gala Romney L, MD   250 mg at 05/19/17 1452  . calcium-vitamin D (OSCAL WITH D) 500-200 MG-UNIT per tablet 1 tablet  1 tablet Oral Q breakfast Elwyn Reach, MD   1 tablet at 05/20/17 0921  . enoxaparin (LOVENOX) injection 30 mg  30 mg Subcutaneous Q24H Colbert Ewing, MD   30 mg at 05/19/17 2126  . insulin aspart (novoLOG) injection 0-5 Units  0-5 Units Subcutaneous QHS Colbert Ewing, MD   4 Units at 05/19/17 2131  . insulin aspart (novoLOG) injection 0-9 Units  0-9 Units Subcutaneous TID WC Park, Derenda Mis, MD   3 Units at 05/19/17 1859  . ipratropium-albuterol (DUONEB) 0.5-2.5 (3) MG/3ML nebulizer solution 3 mL  3 mL Nebulization Q4H PRN Elwyn Reach, MD      . MEDLINE mouth rinse  15 mL Mouth Rinse BID Colbert Ewing, MD   15 mL at 05/19/17 2127  . predniSONE (DELTASONE) tablet 40 mg  40 mg Oral Q breakfast Park,  Derenda Mis, MD   40 mg at 05/20/17 7944  . sodium polystyrene (KAYEXALATE) powder 15 g  15 g Oral Once Elwyn Reach, MD         Discharge Medications: Please see discharge summary for a list of discharge medications.  Relevant Imaging Results:  Relevant Lab Results:   Additional Information SS# [id number]  Wende Neighbors, LCSW

## 2017-05-21 DIAGNOSIS — R0902 Hypoxemia: Secondary | ICD-10-CM | POA: Diagnosis not present

## 2017-05-21 DIAGNOSIS — R600 Localized edema: Secondary | ICD-10-CM | POA: Diagnosis not present

## 2017-05-21 DIAGNOSIS — R609 Edema, unspecified: Secondary | ICD-10-CM | POA: Diagnosis not present

## 2017-05-21 DIAGNOSIS — F5109 Other insomnia not due to a substance or known physiological condition: Secondary | ICD-10-CM | POA: Diagnosis not present

## 2017-05-21 DIAGNOSIS — J9601 Acute respiratory failure with hypoxia: Secondary | ICD-10-CM | POA: Diagnosis not present

## 2017-05-21 DIAGNOSIS — M069 Rheumatoid arthritis, unspecified: Secondary | ICD-10-CM | POA: Diagnosis not present

## 2017-05-21 DIAGNOSIS — R918 Other nonspecific abnormal finding of lung field: Secondary | ICD-10-CM | POA: Diagnosis not present

## 2017-05-21 DIAGNOSIS — R911 Solitary pulmonary nodule: Secondary | ICD-10-CM | POA: Diagnosis not present

## 2017-05-21 DIAGNOSIS — Z111 Encounter for screening for respiratory tuberculosis: Secondary | ICD-10-CM | POA: Diagnosis not present

## 2017-05-21 DIAGNOSIS — E119 Type 2 diabetes mellitus without complications: Secondary | ICD-10-CM | POA: Diagnosis not present

## 2017-05-21 DIAGNOSIS — J449 Chronic obstructive pulmonary disease, unspecified: Secondary | ICD-10-CM | POA: Diagnosis not present

## 2017-05-21 DIAGNOSIS — R52 Pain, unspecified: Secondary | ICD-10-CM | POA: Diagnosis not present

## 2017-05-21 DIAGNOSIS — R2681 Unsteadiness on feet: Secondary | ICD-10-CM | POA: Diagnosis not present

## 2017-05-21 DIAGNOSIS — I10 Essential (primary) hypertension: Secondary | ICD-10-CM | POA: Diagnosis not present

## 2017-05-21 DIAGNOSIS — M6281 Muscle weakness (generalized): Secondary | ICD-10-CM | POA: Diagnosis not present

## 2017-05-21 DIAGNOSIS — E785 Hyperlipidemia, unspecified: Secondary | ICD-10-CM | POA: Diagnosis not present

## 2017-05-21 DIAGNOSIS — K5900 Constipation, unspecified: Secondary | ICD-10-CM | POA: Diagnosis not present

## 2017-05-21 DIAGNOSIS — J80 Acute respiratory distress syndrome: Secondary | ICD-10-CM | POA: Diagnosis not present

## 2017-05-21 DIAGNOSIS — D649 Anemia, unspecified: Secondary | ICD-10-CM | POA: Diagnosis not present

## 2017-05-21 DIAGNOSIS — F331 Major depressive disorder, recurrent, moderate: Secondary | ICD-10-CM | POA: Diagnosis not present

## 2017-05-21 LAB — GLUCOSE, CAPILLARY
GLUCOSE-CAPILLARY: 295 mg/dL — AB (ref 65–99)
Glucose-Capillary: 115 mg/dL — ABNORMAL HIGH (ref 65–99)

## 2017-05-21 LAB — BASIC METABOLIC PANEL
ANION GAP: 6 (ref 5–15)
BUN: 35 mg/dL — ABNORMAL HIGH (ref 6–20)
CALCIUM: 9.4 mg/dL (ref 8.9–10.3)
CO2: 28 mmol/L (ref 22–32)
Chloride: 110 mmol/L (ref 101–111)
Creatinine, Ser: 1.05 mg/dL — ABNORMAL HIGH (ref 0.44–1.00)
GFR calc Af Amer: 57 mL/min — ABNORMAL LOW (ref >60)
GFR calc non Af Amer: 49 mL/min — ABNORMAL LOW (ref >60)
GLUCOSE: 150 mg/dL — AB (ref 65–99)
Potassium: 5.4 mmol/L — ABNORMAL HIGH (ref 3.5–5.1)
Sodium: 144 mmol/L (ref 135–145)

## 2017-05-21 LAB — CBC
HEMATOCRIT: 32.8 % — AB (ref 36.0–46.0)
Hemoglobin: 9.9 g/dL — ABNORMAL LOW (ref 12.0–15.0)
MCH: 29.1 pg (ref 26.0–34.0)
MCHC: 30.2 g/dL (ref 30.0–36.0)
MCV: 96.5 fL (ref 78.0–100.0)
Platelets: 342 10*3/uL (ref 150–400)
RBC: 3.4 MIL/uL — ABNORMAL LOW (ref 3.87–5.11)
RDW: 13.4 % (ref 11.5–15.5)
WBC: 8.8 10*3/uL (ref 4.0–10.5)

## 2017-05-21 MED ORDER — ENOXAPARIN SODIUM 40 MG/0.4ML ~~LOC~~ SOLN: 40.0000 mg | SUBCUTANEOUS | Status: DC

## 2017-05-21 MED ORDER — PREDNISONE 10 MG PO TABS: ORAL_TABLET | ORAL | 0 refills | Status: DC

## 2017-05-21 MED ORDER — SODIUM POLYSTYRENE SULFONATE 15 GM/60ML PO SUSP: 30.0000 g | Freq: Once | ORAL | Status: DC

## 2017-05-21 MED ORDER — IPRATROPIUM-ALBUTEROL 0.5-2.5 (3) MG/3ML IN SOLN: 3.0000 mL | RESPIRATORY_TRACT | Status: DC | PRN

## 2017-05-21 MED ORDER — SODIUM POLYSTYRENE SULFONATE PO POWD
30.0000 g | Freq: Once | ORAL | Status: AC
  Administered 2017-05-21: 30 g via ORAL
  Filled 2017-05-21: qty 30

## 2017-05-21 NOTE — Progress Notes (Signed)
Pt d/c to [facility] via [place] w/ husband. Report called to RN receiving pt.

## 2017-05-21 NOTE — Clinical Social Work Placement (Signed)
   CLINICAL SOCIAL WORK PLACEMENT  NOTE  Date:  05/21/2017  Patient Details  Name: Heidi Moran MRN: [id number] Date of Birth: [birth year]  Clinical Social Work is seeking post-discharge placement for this patient at the [place] level of care (*CSW will initial, date and re-position this form in  chart as items are completed):  Yes   Patient/family provided with [place] Work Department's list of facilities offering this level of care within the geographic area requested by the patient (or if unable, by the patient's family).  Yes   Patient/family informed of their freedom to choose among providers that offer the needed level of care, that participate in Medicare, Medicaid or managed care program needed by the patient, have an available bed and are willing to accept the patient.  Yes   Patient/family informed of [facility]'s ownership interest in [facility] and [facility], as well as of the fact that they are under no obligation to receive care at these facilities.  PASRR submitted to EDS on       PASRR number received on       Existing PASRR number confirmed on 05/21/17     FL2 transmitted to all facilities in geographic area requested by pt/family on 05/21/17     FL2 transmitted to all facilities within larger geographic area on       Patient informed that his/her managed care company has contracts with or will negotiate with certain facilities, including the following:            Patient/family informed of bed offers received.  Patient chooses bed at [facility]     Physician recommends and patient chooses bed at      Patient to be transferred to [facility] on 05/21/17.  Patient to be transferred to facility by [facility]     Patient family notified on 05/21/17 of transfer.  Name of family member notified:  patient spouse at bedside      PHYSICIAN       Additional Comment:     _______________________________________________ Wende Neighbors, LCSW 05/21/2017, 2:16 PM

## 2017-05-21 NOTE — Progress Notes (Signed)
Inpatient Diabetes Program Recommendations  AACE/ADA: New Consensus Statement on Inpatient Glycemic Control (2015)  Target Ranges:  Prepandial:   less than 140 mg/dL      Peak postprandial:   less than 180 mg/dL (1-2 hours)      Critically ill patients:  140 - 180 mg/dL   Lab Results  Component Value Date   GLUCAP 295 (H) 05/21/2017    Review of Glycemic ControlResults for IVRY, PIGUE (MRN [id number]) as of 05/21/2017 11:22  Ref. Range 05/20/2017 06:14 05/20/2017 11:20 05/20/2017 16:11 05/20/2017 20:10 05/21/2017 06:11 05/21/2017 10:55  Glucose-Capillary Latest Ref Range: 65 - 99 mg/dL 110 (H) 216 (H) 295 (H) 465 (H) 115 (H) 295 (H)   Home DM Meds: Glucovance 2.5/500 mg daily  Current Insulin Orders: Novolog Sensitive Correction Scale/ SSI (0-9 units) TID AC + HS Prednisone 40 mg daily Inpatient Diabetes Program Recommendations:   Please consider adding Novolog 3 units tid with meals.  Text page sent.   Thanks,  Adah Perl, RN, BC-ADM Inpatient Diabetes Coordinator Pager [phone] (8a-5p)

## 2017-05-21 NOTE — Progress Notes (Signed)
Clinical Social Worker facilitated patient discharge including contacting patient family and facility to confirm patient discharge plans.  Clinical information faxed to facility and family agreeable with plan.  CSW arranged ambulance transport via [facility] to [facility] .  RN to call [phone] pt will go in room 604 A) for report prior to discharge.  Clinical Social Worker will sign off for now as social work intervention is no longer needed. Please consult [place] again if new need arises.  Rhea Pink, MSW, [place]

## 2017-05-21 NOTE — Care Management Note (Signed)
Case Management Note  Patient Details  Name: Heidi Moran MRN: [id number] Date of Birth: [birth year]  Subjective/Objective:      Acute resp failure with hypoxemia, HTN, DM              Action/Plan: Spoke to pt and husband at bedside. They want [facility] SN[facility] rehab. CSW following for SNF placement.   PCP Dr Burnard Bunting   Expected Discharge Date:  05/20/17               Expected Discharge Plan:  [place]  In-House Referral:  Clinical Social Work  Discharge planning Services  CM Consult  Post Acute Care Choice:  NA Choice offered to:  NA  DME Arranged:  N/A DME Agency:  NA  HH Arranged:  NA HH Agency:  NA  Status of Service:  Completed, signed off  If discussed at [employer] of Stay Meetings, dates discussed:    Additional Comments:  Erenest Rasher, RN 05/21/2017, 1:36 PM

## 2017-05-21 NOTE — Care Management Important Message (Signed)
Important Message  Patient Details  Name: Heidi Moran MRN: [id number] Date of Birth: 26-Jun-1935   Medicare Important Message Given:  Yes    Erenest Rasher, RN 05/21/2017, 1:36 PM

## 2017-05-21 NOTE — Discharge Summary (Signed)
Physician Discharge Summary  Heidi Moran ZOX:[id number] DOB: February 24, 1936 DOA: 05/17/2017  PCP: Burnard Bunting, MD  Admit date: 05/17/2017 Discharge date: 05/21/2017  Time spent: 33 minutes  Recommendations for Outpatient Follow-up:  1. Complete prednisone taper dose  2. Patient needs physical therapy and occupational therapy prior to returning home   Discharge Diagnoses:  Active Problems:   Acute respiratory failure with hypoxia (HCC)   DM (diabetes mellitus) type 2, uncontrolled, with ketoacidosis (HCC)   HTN (hypertension)   Hyperlipidemia   Discharge Condition: Good  Diet recommendation: Diabetic  Filed Weights   05/17/17 1516 05/17/17 2246 05/21/17 0335  Weight: 50.8 kg (112 lb) 52.3 kg (115 lb 3.2 oz) 53 kg (116 lb 13.5 oz)    History of present illness:   Patient 82 year old female with known history of pulmonary nodules and diabetes with no prior diagnosis of COPD presenting with bronchospasm and hypoxemia oxygen 85%. Patient also has significant upper tract infection lately. She did not get any flu swabs. She's had nasal congestion and sneezing. Patient was afebrile except for the bronchospasm and hypoxemia. She was admitted with acute bronchospasm probably secondary to exposure to cats.   Hospital Course:  Patient was admitted and evaluated.  She was placed on IV steroids.  She continues to have significant shortness of breath and cough.  She had marked bronchospasm requiring oxygenation.  She apparently has tried desensitization from heart catheter in the past and has been on prednisone for a while.  Her symptoms improved but she continues to require oxygen.  Patient finally was transitioned to oral steroids with oxygen as well as nebulizer.  She will need to continue this and needs PT OT basal significant weakness.  Short-term rehabilitation recommended.  Patient also has history of diabetes with hypertension and hyperlipidemia both of which where addressed to the home  regimen and sliding scale insulin.  She is currently stable for discharge.  She has good family support including her husband.  She will also require home health possible oxygen at home.  Procedures:  None   Consultations:  None  Discharge Exam: Vitals:   05/21/17 0900 05/21/17 1126  BP:  132/89  Pulse: 93 85  Resp: (!) 24 20  Temp:  97.6 F (36.4 C)  SpO2: 90% 90%    General: Stable, NAD Cardiovascular: RRR Respiratory: Good AE bilaterally no wheeze  Discharge Instructions   Discharge Instructions    Diet - low sodium heart healthy   Complete by:  As directed    Increase activity slowly   Complete by:  As directed      Allergies as of 05/21/2017   No Known Allergies     Medication List    TAKE these medications   atorvastatin 20 MG tablet Commonly known as:  LIPITOR Take 20 mg by mouth daily.   glyBURIDE-metformin 2.5-500 MG tablet Commonly known as:  GLUCOVANCE Take 1 tablet by mouth daily with breakfast.   ipratropium-albuterol 0.5-2.5 (3) MG/3ML Soln Commonly known as:  DUONEB Take 3 mLs by nebulization every 4 (four) hours as needed.   lisinopril 10 MG tablet Commonly known as:  PRINIVIL,ZESTRIL Take 10 mg by mouth daily.   predniSONE 10 MG tablet Commonly known as:  DELTASONE 40 mg daily x 3 days then 20 mg daily x 3 days then 10 mg daily x 3 days   SIMPONI ARIA IV Inject into the vein.   VIACTIV PO Take 1 tablet by mouth daily.      No Known  Allergies Contact information for after-discharge care    Destination    [facility] .   Service:  Skilled Nursing Contact information: [address] [phone]               The results of significant diagnostics from this hospitalization (including imaging, microbiology, ancillary and laboratory) are listed below for reference.    Significant Diagnostic Studies: Dg Chest 2 View  Result Date: 05/17/2017 CLINICAL DATA:  Generalized  weakness. EXAM: CHEST  2 VIEW COMPARISON:  Radiographs of January 28, 2017. PET scan of February 27, 2017. FINDINGS: The heart size and mediastinal contours are within normal limits. No pneumothorax or pleural effusion is noted. There are again noted multiple nodules in the right lung, as described on prior chest radiograph and PET scan. No new consolidative process is noted. The visualized skeletal structures are unremarkable. IMPRESSION: Stable right lung nodules are noted as described on prior chest radiograph in PET scan. Malignancy cannot be excluded. Electronically Signed   By: Marijo Conception, M.D.   On: 05/17/2017 16:04   Ct Angio Chest Pe W And/or Wo Contrast  Result Date: 05/17/2017 CLINICAL DATA:  82 year old female with history of shortness of breath over the past couple weeks. EXAM: CT ANGIOGRAPHY CHEST WITH CONTRAST TECHNIQUE: Multidetector CT imaging of the chest was performed using the standard protocol during bolus administration of intravenous contrast. Multiplanar CT image reconstructions and MIPs were obtained to evaluate the vascular anatomy. CONTRAST:  54mL ISOVUE-370 IOPAMIDOL (ISOVUE-370) INJECTION 76% COMPARISON:  PET-CT 02/27/2017. FINDINGS: Cardiovascular: No filling defect in the pulmonary artery tree to suggest underlying pulmonary embolism. Heart size is borderline enlarged. There is no significant pericardial fluid, thickening or pericardial calcification. Mediastinum/Nodes: Multiple prominent borderline enlarged mediastinal and bilateral hilar lymph nodes measuring up to 12 mm in short axis. Esophagus is unremarkable in appearance. No axillary lymphadenopathy. Lungs/Pleura: Again noted are numerous pulmonary nodules scattered throughout the lungs bilaterally, similar in size, number and distribution to prior PET-CT 02/27/2017, largest of which is a 13 mm nodule in the lateral segment of the right middle lobe (axial image 91 of series 6). Several of these nodules have some  surrounding ground-glass attenuation. No acute consolidative airspace disease. No pleural effusions. Upper Abdomen: Aortic atherosclerosis. Musculoskeletal: There are no aggressive appearing lytic or blastic lesions noted in the visualized portions of the skeleton. Review of the MIP images confirms the above findings. IMPRESSION: 1. No evidence of pulmonary embolism. 2. No acute findings noted in the thorax. 3. Numerous bilateral pulmonary nodules stable compared to prior examination from 02/27/2017. These are of uncertain etiology and significance, but their stability over time could suggest a benign etiology unless the patient is undergoing active treatment for malignancy. One consideration is that of diffuse idiopathic neuroendocrine cell hyperplasia (DIPNECH). Continued attention on followup studies is recommended. Outpatient referral to pulmonology for further evaluation should also be considered if not already obtained. Electronically Signed   By: Vinnie Langton M.D.   On: 05/17/2017 18:32    Microbiology: Recent Results (from the past 240 hour(s))  Respiratory Panel by PCR     Status: None   Collection Time: 05/17/17  5:38 PM  Result Value Ref Range Status   Adenovirus NOT DETECTED NOT DETECTED Final   Coronavirus 229E NOT DETECTED NOT DETECTED Final   Coronavirus HKU1 NOT DETECTED NOT DETECTED Final   Coronavirus NL63 NOT DETECTED NOT DETECTED Final   Coronavirus OC43 NOT DETECTED NOT DETECTED Final  Metapneumovirus NOT DETECTED NOT DETECTED Final   Rhinovirus / Enterovirus NOT DETECTED NOT DETECTED Final   Influenza A NOT DETECTED NOT DETECTED Final   Influenza B NOT DETECTED NOT DETECTED Final   Parainfluenza Virus 1 NOT DETECTED NOT DETECTED Final   Parainfluenza Virus 2 NOT DETECTED NOT DETECTED Final   Parainfluenza Virus 3 NOT DETECTED NOT DETECTED Final   Parainfluenza Virus 4 NOT DETECTED NOT DETECTED Final   Respiratory Syncytial Virus NOT DETECTED NOT DETECTED Final    Bordetella pertussis NOT DETECTED NOT DETECTED Final   Chlamydophila pneumoniae NOT DETECTED NOT DETECTED Final   Mycoplasma pneumoniae NOT DETECTED NOT DETECTED Final    Comment: Performed at [facility], [place] [address], [place], [place] [address]  Blood culture (routine x 2)     Status: None (Preliminary result)   Collection Time: 05/17/17  6:59 PM  Result Value Ref Range Status   Specimen Description BLOOD LEFT ANTECUBITAL  Final   Special Requests   Final    BOTTLES DRAWN AEROBIC AND ANAEROBIC Blood Culture adequate volume   Culture   Final    NO GROWTH 4 DAYS Performed at [facility], [place] [address] [place], [place] [address]    Report Status PENDING  Incomplete  Blood culture (routine x 2)     Status: None (Preliminary result)   Collection Time: 05/17/17  7:08 PM  Result Value Ref Range Status   Specimen Description BLOOD LEFT HAND  Final   Special Requests IN PEDIATRIC BOTTLE Blood Culture adequate volume  Final   Culture   Final    NO GROWTH 4 DAYS Performed at [facility], [place] [address], [place], [place] [address]    Report Status PENDING  Incomplete  MRSA PCR Screening     Status: None   Collection Time: 05/17/17 11:26 PM  Result Value Ref Range Status   MRSA by PCR NEGATIVE NEGATIVE Final    Comment:        The GeneXpert MRSA Assay (FDA approved for NASAL specimens only), is one component of a comprehensive MRSA colonization surveillance program. It is not intended to diagnose MRSA infection nor to guide or monitor treatment for MRSA infections. Performed at [facility], [place] [address] [place], [place] [address]      Labs: Basic Metabolic Panel: Recent Labs  Lab 05/17/17 1530 05/18/17 0431 05/19/17 0304 05/20/17 0834 05/20/17 2206 05/21/17 0246  NA 132* 135 138 142  --  144  K 5.2* 5.3* 5.9* 5.3*  --  5.4*  CL 96* 104 107 109  --  110  CO2 23 23 22 28   --  28  GLUCOSE 186* 161* 193* 160* 397* 150*  BUN 39* 30* 47* 42*   --  35*  CREATININE 1.26* 0.95 1.59* 1.06*  --  1.05*  CALCIUM 8.7* 8.1* 8.5* 9.5  --  9.4   Liver Function Tests: Recent Labs  Lab 05/17/17 1530  AST 36  ALT 35  ALKPHOS 81  BILITOT 0.6  PROT 7.0  ALBUMIN 3.4*   Recent Labs  Lab 05/17/17 1530  LIPASE 35   No results for input(s): AMMONIA in the last 168 hours. CBC: Recent Labs  Lab 05/17/17 1530 05/18/17 0431 05/19/17 0304 05/20/17 0225 05/21/17 0246  WBC 13.5* 11.2* 11.3* 10.2 8.8  HGB 12.3 10.9* 10.8* 10.2* 9.9*  HCT 38.5 34.5* 36.1 33.4* 32.8*  MCV 94.1 95.3 98.6 96.5 96.5  PLT 395 331 354 358  342   Cardiac Enzymes: No results for input(s): CKTOTAL, CKMB, CKMBINDEX, TROPONINI in the last 168 hours. BNP: BNP (last 3 results) Recent Labs    05/17/17 1530  BNP 150.0*    ProBNP (last 3 results) No results for input(s): PROBNP in the last 8760 hours.  CBG: Recent Labs  Lab 05/20/17 1120 05/20/17 1611 05/20/17 2010 05/21/17 0611 05/21/17 1055  GLUCAP 216* 295* 465* 115* 295*       SignedBarbette Merino MD.  [facility] 05/21/2017, 3:12 PM

## 2017-05-22 DIAGNOSIS — J9601 Acute respiratory failure with hypoxia: Secondary | ICD-10-CM | POA: Diagnosis not present

## 2017-05-22 DIAGNOSIS — D649 Anemia, unspecified: Secondary | ICD-10-CM | POA: Diagnosis not present

## 2017-05-22 DIAGNOSIS — E785 Hyperlipidemia, unspecified: Secondary | ICD-10-CM | POA: Diagnosis not present

## 2017-05-22 DIAGNOSIS — I10 Essential (primary) hypertension: Secondary | ICD-10-CM | POA: Diagnosis not present

## 2017-05-22 DIAGNOSIS — E119 Type 2 diabetes mellitus without complications: Secondary | ICD-10-CM | POA: Diagnosis not present

## 2017-05-22 DIAGNOSIS — M069 Rheumatoid arthritis, unspecified: Secondary | ICD-10-CM | POA: Diagnosis not present

## 2017-05-22 DIAGNOSIS — M6281 Muscle weakness (generalized): Secondary | ICD-10-CM | POA: Diagnosis not present

## 2017-05-22 LAB — CULTURE, BLOOD (ROUTINE X 2)
CULTURE: NO GROWTH
Culture: NO GROWTH
SPECIAL REQUESTS: ADEQUATE
Special Requests: ADEQUATE

## 2017-05-23 DIAGNOSIS — E119 Type 2 diabetes mellitus without complications: Secondary | ICD-10-CM | POA: Diagnosis not present

## 2017-05-23 DIAGNOSIS — I10 Essential (primary) hypertension: Secondary | ICD-10-CM | POA: Diagnosis not present

## 2017-05-23 DIAGNOSIS — D649 Anemia, unspecified: Secondary | ICD-10-CM | POA: Diagnosis not present

## 2017-05-23 DIAGNOSIS — E785 Hyperlipidemia, unspecified: Secondary | ICD-10-CM | POA: Diagnosis not present

## 2017-05-23 DIAGNOSIS — J9601 Acute respiratory failure with hypoxia: Secondary | ICD-10-CM | POA: Diagnosis not present

## 2017-05-23 DIAGNOSIS — M069 Rheumatoid arthritis, unspecified: Secondary | ICD-10-CM | POA: Diagnosis not present

## 2017-05-23 DIAGNOSIS — M6281 Muscle weakness (generalized): Secondary | ICD-10-CM | POA: Diagnosis not present

## 2017-05-26 ENCOUNTER — Other Ambulatory Visit: Payer: Medicare Other

## 2017-05-26 DIAGNOSIS — E119 Type 2 diabetes mellitus without complications: Secondary | ICD-10-CM | POA: Diagnosis not present

## 2017-05-26 DIAGNOSIS — R911 Solitary pulmonary nodule: Secondary | ICD-10-CM | POA: Diagnosis not present

## 2017-05-26 DIAGNOSIS — R600 Localized edema: Secondary | ICD-10-CM | POA: Diagnosis not present

## 2017-05-26 DIAGNOSIS — E785 Hyperlipidemia, unspecified: Secondary | ICD-10-CM | POA: Diagnosis not present

## 2017-05-26 DIAGNOSIS — M069 Rheumatoid arthritis, unspecified: Secondary | ICD-10-CM | POA: Diagnosis not present

## 2017-05-26 DIAGNOSIS — M6281 Muscle weakness (generalized): Secondary | ICD-10-CM | POA: Diagnosis not present

## 2017-05-26 DIAGNOSIS — I10 Essential (primary) hypertension: Secondary | ICD-10-CM | POA: Diagnosis not present

## 2017-05-26 DIAGNOSIS — D649 Anemia, unspecified: Secondary | ICD-10-CM | POA: Diagnosis not present

## 2017-05-26 DIAGNOSIS — J9601 Acute respiratory failure with hypoxia: Secondary | ICD-10-CM | POA: Diagnosis not present

## 2017-05-28 ENCOUNTER — Ambulatory Visit: Payer: Medicare Other | Admitting: Pulmonary Disease

## 2017-05-28 DIAGNOSIS — E119 Type 2 diabetes mellitus without complications: Secondary | ICD-10-CM | POA: Diagnosis not present

## 2017-05-28 DIAGNOSIS — E785 Hyperlipidemia, unspecified: Secondary | ICD-10-CM | POA: Diagnosis not present

## 2017-05-28 DIAGNOSIS — R600 Localized edema: Secondary | ICD-10-CM | POA: Diagnosis not present

## 2017-05-28 DIAGNOSIS — M069 Rheumatoid arthritis, unspecified: Secondary | ICD-10-CM | POA: Diagnosis not present

## 2017-05-28 DIAGNOSIS — I10 Essential (primary) hypertension: Secondary | ICD-10-CM | POA: Diagnosis not present

## 2017-05-28 DIAGNOSIS — J9601 Acute respiratory failure with hypoxia: Secondary | ICD-10-CM | POA: Diagnosis not present

## 2017-05-28 DIAGNOSIS — M6281 Muscle weakness (generalized): Secondary | ICD-10-CM | POA: Diagnosis not present

## 2017-05-28 DIAGNOSIS — R911 Solitary pulmonary nodule: Secondary | ICD-10-CM | POA: Diagnosis not present

## 2017-05-28 DIAGNOSIS — D649 Anemia, unspecified: Secondary | ICD-10-CM | POA: Diagnosis not present

## 2017-05-29 DIAGNOSIS — M069 Rheumatoid arthritis, unspecified: Secondary | ICD-10-CM | POA: Diagnosis not present

## 2017-05-29 DIAGNOSIS — J9601 Acute respiratory failure with hypoxia: Secondary | ICD-10-CM | POA: Diagnosis not present

## 2017-05-29 DIAGNOSIS — M6281 Muscle weakness (generalized): Secondary | ICD-10-CM | POA: Diagnosis not present

## 2017-06-02 DIAGNOSIS — R911 Solitary pulmonary nodule: Secondary | ICD-10-CM | POA: Diagnosis not present

## 2017-06-02 DIAGNOSIS — D649 Anemia, unspecified: Secondary | ICD-10-CM | POA: Diagnosis not present

## 2017-06-02 DIAGNOSIS — E785 Hyperlipidemia, unspecified: Secondary | ICD-10-CM | POA: Diagnosis not present

## 2017-06-02 DIAGNOSIS — M069 Rheumatoid arthritis, unspecified: Secondary | ICD-10-CM | POA: Diagnosis not present

## 2017-06-02 DIAGNOSIS — E119 Type 2 diabetes mellitus without complications: Secondary | ICD-10-CM | POA: Diagnosis not present

## 2017-06-02 DIAGNOSIS — M6281 Muscle weakness (generalized): Secondary | ICD-10-CM | POA: Diagnosis not present

## 2017-06-02 DIAGNOSIS — I10 Essential (primary) hypertension: Secondary | ICD-10-CM | POA: Diagnosis not present

## 2017-06-02 DIAGNOSIS — J9601 Acute respiratory failure with hypoxia: Secondary | ICD-10-CM | POA: Diagnosis not present

## 2017-06-02 DIAGNOSIS — R609 Edema, unspecified: Secondary | ICD-10-CM | POA: Diagnosis not present

## 2017-06-04 ENCOUNTER — Telehealth: Payer: Self-pay | Admitting: Pulmonary Disease

## 2017-06-04 DIAGNOSIS — E785 Hyperlipidemia, unspecified: Secondary | ICD-10-CM | POA: Diagnosis not present

## 2017-06-04 DIAGNOSIS — J9601 Acute respiratory failure with hypoxia: Secondary | ICD-10-CM | POA: Diagnosis not present

## 2017-06-04 DIAGNOSIS — D649 Anemia, unspecified: Secondary | ICD-10-CM | POA: Diagnosis not present

## 2017-06-04 DIAGNOSIS — E1165 Type 2 diabetes mellitus with hyperglycemia: Secondary | ICD-10-CM | POA: Diagnosis not present

## 2017-06-04 DIAGNOSIS — I10 Essential (primary) hypertension: Secondary | ICD-10-CM | POA: Diagnosis not present

## 2017-06-04 DIAGNOSIS — J441 Chronic obstructive pulmonary disease with (acute) exacerbation: Secondary | ICD-10-CM | POA: Diagnosis not present

## 2017-06-04 DIAGNOSIS — M069 Rheumatoid arthritis, unspecified: Secondary | ICD-10-CM | POA: Diagnosis not present

## 2017-06-04 DIAGNOSIS — Z7984 Long term (current) use of oral hypoglycemic drugs: Secondary | ICD-10-CM | POA: Diagnosis not present

## 2017-06-04 DIAGNOSIS — M1990 Unspecified osteoarthritis, unspecified site: Secondary | ICD-10-CM | POA: Diagnosis not present

## 2017-06-04 DIAGNOSIS — Z9981 Dependence on supplemental oxygen: Secondary | ICD-10-CM | POA: Diagnosis not present

## 2017-06-04 DIAGNOSIS — R911 Solitary pulmonary nodule: Secondary | ICD-10-CM | POA: Diagnosis not present

## 2017-06-04 NOTE — Telephone Encounter (Signed)
Spoke with Heidi Moran and notified that Dr. Vaughan Browner is out of the office until next wk  He states in this case, he will call her PCP  Nothing further needed

## 2017-06-09 DIAGNOSIS — D649 Anemia, unspecified: Secondary | ICD-10-CM | POA: Diagnosis not present

## 2017-06-09 DIAGNOSIS — I10 Essential (primary) hypertension: Secondary | ICD-10-CM | POA: Diagnosis not present

## 2017-06-09 DIAGNOSIS — E1165 Type 2 diabetes mellitus with hyperglycemia: Secondary | ICD-10-CM | POA: Diagnosis not present

## 2017-06-09 DIAGNOSIS — D8989 Other specified disorders involving the immune mechanism, not elsewhere classified: Secondary | ICD-10-CM | POA: Diagnosis not present

## 2017-06-09 DIAGNOSIS — E11319 Type 2 diabetes mellitus with unspecified diabetic retinopathy without macular edema: Secondary | ICD-10-CM | POA: Diagnosis not present

## 2017-06-09 DIAGNOSIS — R918 Other nonspecific abnormal finding of lung field: Secondary | ICD-10-CM | POA: Diagnosis not present

## 2017-06-09 DIAGNOSIS — J441 Chronic obstructive pulmonary disease with (acute) exacerbation: Secondary | ICD-10-CM | POA: Diagnosis not present

## 2017-06-09 DIAGNOSIS — Z6821 Body mass index (BMI) 21.0-21.9, adult: Secondary | ICD-10-CM | POA: Diagnosis not present

## 2017-06-09 DIAGNOSIS — Z79899 Other long term (current) drug therapy: Secondary | ICD-10-CM | POA: Diagnosis not present

## 2017-06-09 DIAGNOSIS — J9601 Acute respiratory failure with hypoxia: Secondary | ICD-10-CM | POA: Diagnosis not present

## 2017-06-09 DIAGNOSIS — E1129 Type 2 diabetes mellitus with other diabetic kidney complication: Secondary | ICD-10-CM | POA: Diagnosis not present

## 2017-06-09 DIAGNOSIS — M353 Polymyalgia rheumatica: Secondary | ICD-10-CM | POA: Diagnosis not present

## 2017-06-09 DIAGNOSIS — N183 Chronic kidney disease, stage 3 (moderate): Secondary | ICD-10-CM | POA: Diagnosis not present

## 2017-06-09 DIAGNOSIS — M069 Rheumatoid arthritis, unspecified: Secondary | ICD-10-CM | POA: Diagnosis not present

## 2017-06-09 DIAGNOSIS — E119 Type 2 diabetes mellitus without complications: Secondary | ICD-10-CM | POA: Diagnosis not present

## 2017-06-09 DIAGNOSIS — K862 Cyst of pancreas: Secondary | ICD-10-CM | POA: Diagnosis not present

## 2017-06-09 DIAGNOSIS — I129 Hypertensive chronic kidney disease with stage 1 through stage 4 chronic kidney disease, or unspecified chronic kidney disease: Secondary | ICD-10-CM | POA: Diagnosis not present

## 2017-06-11 ENCOUNTER — Ambulatory Visit (INDEPENDENT_AMBULATORY_CARE_PROVIDER_SITE_OTHER)
Admission: RE | Admit: 2017-06-11 | Discharge: 2017-06-11 | Disposition: A | Discharge status: Home / Self Care | Payer: Medicare Other | Source: Ambulatory Visit | Attending: Pulmonary Disease | Admitting: Pulmonary Disease

## 2017-06-11 DIAGNOSIS — Z79899 Other long term (current) drug therapy: Secondary | ICD-10-CM | POA: Diagnosis not present

## 2017-06-11 DIAGNOSIS — Z6822 Body mass index (BMI) 22.0-22.9, adult: Secondary | ICD-10-CM | POA: Diagnosis not present

## 2017-06-11 DIAGNOSIS — R911 Solitary pulmonary nodule: Secondary | ICD-10-CM

## 2017-06-11 DIAGNOSIS — D649 Anemia, unspecified: Secondary | ICD-10-CM | POA: Diagnosis not present

## 2017-06-11 DIAGNOSIS — M069 Rheumatoid arthritis, unspecified: Secondary | ICD-10-CM | POA: Diagnosis not present

## 2017-06-11 DIAGNOSIS — E1165 Type 2 diabetes mellitus with hyperglycemia: Secondary | ICD-10-CM | POA: Diagnosis not present

## 2017-06-11 DIAGNOSIS — I10 Essential (primary) hypertension: Secondary | ICD-10-CM | POA: Diagnosis not present

## 2017-06-11 DIAGNOSIS — J441 Chronic obstructive pulmonary disease with (acute) exacerbation: Secondary | ICD-10-CM | POA: Diagnosis not present

## 2017-06-11 DIAGNOSIS — M2550 Pain in unspecified joint: Secondary | ICD-10-CM | POA: Diagnosis not present

## 2017-06-11 DIAGNOSIS — M0579 Rheumatoid arthritis with rheumatoid factor of multiple sites without organ or systems involvement: Secondary | ICD-10-CM | POA: Diagnosis not present

## 2017-06-11 DIAGNOSIS — J9601 Acute respiratory failure with hypoxia: Secondary | ICD-10-CM | POA: Diagnosis not present

## 2017-06-12 DIAGNOSIS — J441 Chronic obstructive pulmonary disease with (acute) exacerbation: Secondary | ICD-10-CM | POA: Diagnosis not present

## 2017-06-12 DIAGNOSIS — E1165 Type 2 diabetes mellitus with hyperglycemia: Secondary | ICD-10-CM | POA: Diagnosis not present

## 2017-06-12 DIAGNOSIS — J9601 Acute respiratory failure with hypoxia: Secondary | ICD-10-CM | POA: Diagnosis not present

## 2017-06-12 DIAGNOSIS — D649 Anemia, unspecified: Secondary | ICD-10-CM | POA: Diagnosis not present

## 2017-06-12 DIAGNOSIS — M069 Rheumatoid arthritis, unspecified: Secondary | ICD-10-CM | POA: Diagnosis not present

## 2017-06-12 DIAGNOSIS — I10 Essential (primary) hypertension: Secondary | ICD-10-CM | POA: Diagnosis not present

## 2017-06-13 DIAGNOSIS — J9601 Acute respiratory failure with hypoxia: Secondary | ICD-10-CM | POA: Diagnosis not present

## 2017-06-13 DIAGNOSIS — J441 Chronic obstructive pulmonary disease with (acute) exacerbation: Secondary | ICD-10-CM | POA: Diagnosis not present

## 2017-06-13 DIAGNOSIS — E1165 Type 2 diabetes mellitus with hyperglycemia: Secondary | ICD-10-CM | POA: Diagnosis not present

## 2017-06-13 DIAGNOSIS — I10 Essential (primary) hypertension: Secondary | ICD-10-CM | POA: Diagnosis not present

## 2017-06-13 DIAGNOSIS — D649 Anemia, unspecified: Secondary | ICD-10-CM | POA: Diagnosis not present

## 2017-06-13 DIAGNOSIS — M069 Rheumatoid arthritis, unspecified: Secondary | ICD-10-CM | POA: Diagnosis not present

## 2017-06-17 DIAGNOSIS — I10 Essential (primary) hypertension: Secondary | ICD-10-CM | POA: Diagnosis not present

## 2017-06-17 DIAGNOSIS — D649 Anemia, unspecified: Secondary | ICD-10-CM | POA: Diagnosis not present

## 2017-06-17 DIAGNOSIS — M069 Rheumatoid arthritis, unspecified: Secondary | ICD-10-CM | POA: Diagnosis not present

## 2017-06-17 DIAGNOSIS — J9601 Acute respiratory failure with hypoxia: Secondary | ICD-10-CM | POA: Diagnosis not present

## 2017-06-17 DIAGNOSIS — J441 Chronic obstructive pulmonary disease with (acute) exacerbation: Secondary | ICD-10-CM | POA: Diagnosis not present

## 2017-06-17 DIAGNOSIS — E1165 Type 2 diabetes mellitus with hyperglycemia: Secondary | ICD-10-CM | POA: Diagnosis not present

## 2017-06-18 DIAGNOSIS — J9601 Acute respiratory failure with hypoxia: Secondary | ICD-10-CM | POA: Diagnosis not present

## 2017-06-18 DIAGNOSIS — I10 Essential (primary) hypertension: Secondary | ICD-10-CM | POA: Diagnosis not present

## 2017-06-18 DIAGNOSIS — J441 Chronic obstructive pulmonary disease with (acute) exacerbation: Secondary | ICD-10-CM | POA: Diagnosis not present

## 2017-06-18 DIAGNOSIS — E1165 Type 2 diabetes mellitus with hyperglycemia: Secondary | ICD-10-CM | POA: Diagnosis not present

## 2017-06-18 DIAGNOSIS — M069 Rheumatoid arthritis, unspecified: Secondary | ICD-10-CM | POA: Diagnosis not present

## 2017-06-18 DIAGNOSIS — D649 Anemia, unspecified: Secondary | ICD-10-CM | POA: Diagnosis not present

## 2017-06-19 DIAGNOSIS — E1165 Type 2 diabetes mellitus with hyperglycemia: Secondary | ICD-10-CM | POA: Diagnosis not present

## 2017-06-19 DIAGNOSIS — I10 Essential (primary) hypertension: Secondary | ICD-10-CM | POA: Diagnosis not present

## 2017-06-19 DIAGNOSIS — M069 Rheumatoid arthritis, unspecified: Secondary | ICD-10-CM | POA: Diagnosis not present

## 2017-06-19 DIAGNOSIS — J441 Chronic obstructive pulmonary disease with (acute) exacerbation: Secondary | ICD-10-CM | POA: Diagnosis not present

## 2017-06-19 DIAGNOSIS — J9601 Acute respiratory failure with hypoxia: Secondary | ICD-10-CM | POA: Diagnosis not present

## 2017-06-19 DIAGNOSIS — D649 Anemia, unspecified: Secondary | ICD-10-CM | POA: Diagnosis not present

## 2017-06-20 DIAGNOSIS — E1165 Type 2 diabetes mellitus with hyperglycemia: Secondary | ICD-10-CM | POA: Diagnosis not present

## 2017-06-20 DIAGNOSIS — D649 Anemia, unspecified: Secondary | ICD-10-CM | POA: Diagnosis not present

## 2017-06-20 DIAGNOSIS — J441 Chronic obstructive pulmonary disease with (acute) exacerbation: Secondary | ICD-10-CM | POA: Diagnosis not present

## 2017-06-20 DIAGNOSIS — J9601 Acute respiratory failure with hypoxia: Secondary | ICD-10-CM | POA: Diagnosis not present

## 2017-06-20 DIAGNOSIS — I10 Essential (primary) hypertension: Secondary | ICD-10-CM | POA: Diagnosis not present

## 2017-06-20 DIAGNOSIS — M069 Rheumatoid arthritis, unspecified: Secondary | ICD-10-CM | POA: Diagnosis not present

## 2017-06-24 ENCOUNTER — Other Ambulatory Visit (INDEPENDENT_AMBULATORY_CARE_PROVIDER_SITE_OTHER): Payer: Medicare Other

## 2017-06-24 ENCOUNTER — Ambulatory Visit (INDEPENDENT_AMBULATORY_CARE_PROVIDER_SITE_OTHER): Payer: Medicare Other | Admitting: Pulmonary Disease

## 2017-06-24 ENCOUNTER — Encounter: Payer: Self-pay | Admitting: Pulmonary Disease

## 2017-06-24 VITALS — BP 110/68 | HR 85 | Ht 61.0 in | Wt 114.0 lb

## 2017-06-24 DIAGNOSIS — R0602 Shortness of breath: Secondary | ICD-10-CM | POA: Diagnosis not present

## 2017-06-24 DIAGNOSIS — R911 Solitary pulmonary nodule: Secondary | ICD-10-CM

## 2017-06-24 LAB — CBC WITH DIFFERENTIAL/PLATELET
Basophils Absolute: 0.1 10*3/uL (ref 0.0–0.1)
Basophils Relative: 1.1 % (ref 0.0–3.0)
EOS PCT: 1.5 % (ref 0.0–5.0)
Eosinophils Absolute: 0.1 10*3/uL (ref 0.0–0.7)
HEMATOCRIT: 30.7 % — AB (ref 36.0–46.0)
Hemoglobin: 10.2 g/dL — ABNORMAL LOW (ref 12.0–15.0)
LYMPHS PCT: 20.5 % (ref 12.0–46.0)
Lymphs Abs: 1.3 10*3/uL (ref 0.7–4.0)
MCHC: 33.2 g/dL (ref 30.0–36.0)
MCV: 92.2 fl (ref 78.0–100.0)
MONOS PCT: 10 % (ref 3.0–12.0)
Monocytes Absolute: 0.6 10*3/uL (ref 0.1–1.0)
NEUTROS ABS: 4.2 10*3/uL (ref 1.4–7.7)
NEUTROS PCT: 66.9 % (ref 43.0–77.0)
Platelets: 284 10*3/uL (ref 150.0–400.0)
RBC: 3.33 Mil/uL — AB (ref 3.87–5.11)
RDW: 14 % (ref 11.5–15.5)
WBC: 6.2 10*3/uL (ref 4.0–10.5)

## 2017-06-24 LAB — NITRIC OXIDE: Nitric Oxide: 36

## 2017-06-24 NOTE — Patient Instructions (Addendum)
We will check your oxygen levels today on exertion and check a FENO Check CBC differential and blood allergy profile and pulmonary function test to evaluate for asthma CT scan shows stable lung nodules without any change.  We will continue to observe this for now  Follow-up in 2-3 months.

## 2017-06-24 NOTE — Progress Notes (Addendum)
Heidi Moran    [id number]    [birth year]  Primary Care Physician:Aronson, Delfino Lovett, MD  Referring Physician: Burnard Bunting, MD [address] [place],  [address]  Chief complaint:  Follow up for lung nodules  HPI: 82 year old with history of rheumatoid arthritis, diabetes, hyperlipidemia, hypertension.  She was evaluated in the ED recently fatigue, cough with a chest x-ray that showed lung nodules.  She had a follow-up CT scan and PET scan which demonstrated multiple pulmonary nodules, mild uptake in the dominant nodule.  [Reports below]. She has history of rheumatoid arthritis diagnosed about 2 years ago.  She follows with Dr. Trudie Reed and is on Golinumab infusions.    Pets: Cats.  No birds, farm animals Occupation: Housewife Exposures: None exposures Smoking history: Never smoker Travel History:Grew up in [place].  Moved to [place] 35 years ago.  Interim history: Hospitalized in March 2019 for acute respiratory failure with bronchospasm and hypoxia, upper respiratory tract infection. Flu test was negative, CT chest showed no pulmonary embolism or acute lung abnormality Treated with steroids with improvement in symptoms and discharged on supplemental oxygen to SNF. Returns to clinic today with improving symptoms of dyspnea.  She still continues on supplemental oxygen No new symptoms of cough, sputum production, fevers, wheezing.  Outpatient Encounter Medications as of 06/24/2017  Medication Sig  . atorvastatin (LIPITOR) 20 MG tablet Take 20 mg by mouth daily.  . Calcium-Vitamin D-Vitamin K (VIACTIV PO) Take 1 tablet by mouth daily.  Marland Kitchen glyBURIDE-metformin (GLUCOVANCE) 2.5-500 MG tablet Take 1 tablet by mouth daily with breakfast.  . Golimumab ([place] ARIA IV) Inject into the vein.  Marland Kitchen ipratropium-albuterol (DUONEB) 0.5-2.5 (3) MG/3ML SOLN Take 3 mLs by nebulization every 4 (four) hours as needed.  Marland Kitchen lisinopril (PRINIVIL,ZESTRIL) 10 MG tablet Take 10 mg by  mouth daily.  . predniSONE (DELTASONE) 10 MG tablet 40 mg daily x 3 days then 20 mg daily x 3 days then 10 mg daily x 3 days   No facility-administered encounter medications on file as of 06/24/2017.     Allergies as of 06/24/2017  . (No Known Allergies)    Past Medical History:  Diagnosis Date  . Anemia   . Diabetes mellitus without complication ([place])   . Eczema   . Hyperlipidemia   . Hypertension   . Osteoarthritis   . Renal disorder     No past surgical history on file.  No family history on file.  Social History   Socioeconomic History  . Marital status: Married    Spouse name: Not on file  . Number of children: Not on file  . Years of education: Not on file  . Highest education level: Not on file  Occupational History  . Not on file  Social Needs  . Financial resource strain: Not on file  . Food insecurity:    Worry: Not on file    Inability: Not on file  . Transportation needs:    Medical: Not on file    Non-medical: Not on file  Tobacco Use  . Smoking status: Never Smoker  . Smokeless tobacco: Never Used  Substance and Sexual Activity  . Alcohol use: No  . Drug use: No  . Sexual activity: Not on file  Lifestyle  . Physical activity:    Days per week: Not on file    Minutes per session: Not on file  . Stress: Not on file  Relationships  . Social connections:  Talks on phone: Not on file    Gets together: Not on file    Attends religious service: Not on file    Active member of club or organization: Not on file    Attends meetings of clubs or organizations: Not on file    Relationship status: Not on file  . Intimate partner violence:    Fear of current or ex partner: Not on file    Emotionally abused: Not on file    Physically abused: Not on file    Forced sexual activity: Not on file  Other Topics Concern  . Not on file  Social History Narrative  . Not on file    Review of systems: Review of Systems  Constitutional: Negative for fever  and chills.  HENT: Negative.   Eyes: Negative for blurred vision.  Respiratory: as per HPI  Cardiovascular: Negative for chest pain and palpitations.  Gastrointestinal: Negative for vomiting, diarrhea, blood per rectum. Genitourinary: Negative for dysuria, urgency, frequency and hematuria.  Musculoskeletal: Negative for myalgias, back pain and joint pain.  Skin: Negative for itching and rash.  Neurological: Negative for dizziness, tremors, focal weakness, seizures and loss of consciousness.  Endo/Heme/Allergies: Negative for environmental allergies.  Psychiatric/Behavioral: Negative for depression, suicidal ideas and hallucinations.  All other systems reviewed and are negative.  Physical Exam: Blood pressure 114/66, pulse 74, height 5\' 1"  (1.549 m), weight 116 lb 6.4 oz (52.8 kg), SpO2 97 %. Gen:      No acute distress HEENT:  EOMI, sclera anicteric Neck:     No masses; no thyromegaly Lungs:    Clear to auscultation bilaterally; normal respiratory effort CV:         Regular rate and rhythm; no murmurs Abd:      + bowel sounds; soft, non-tender; no palpable masses, no distension Ext:    No edema; adequate peripheral perfusion Skin:      Warm and dry; no rash Neuro: alert and oriented x 3 Psych: normal mood and affect  Data Reviewed: Chest x-ray 01/28/17-multiple nodular densities. CT chest 02/12/17-borderline precarinal lymph nodes, multiple pulmonary nodules bilaterally.  The largest one in the right upper lobe measures 13 x11 mm. PET scan 02/27/17-dominant nodule in the right middle lobe has mild-moderate FDG uptake.  No other areas of uptake. CTA 05/17/17-no pulmonary embolism, stable lung nodules. CT scan 06/11/17-stability of pulmonary nodules the largest is 1.3 cm in the right middle lobe. I have reviewed the images personally  Received reports (no images) from primary care office Chest x-ray 04/11/16 Stable right lung nodules likely granuloma.  No change compared to  03/31/15  FENO 06/24/17-36  Assessment:  Recent admission for respiratory failure Unclear etiology for her recent admission.  She could have acute bronchospasm with underlying asthma given slightly elevated FENO Will evaluate with CBC differential, blood allergy profile, PFTs She did not desat off oxygen on walking in office however patient appears to be more comfortable when she is on oxygen.  We will continue for now and reevaluate at return visit.  Follow up for multiple pulmonary nodules Her images reviewed which shows stable rounded nodular opacities with mild PET activity.  Per report from primary care this has been present since at least 2017. This could be consistent with rheumatoid nodules which are are known to have low FDG uptake. The PET scan does not reveal any other abnormal areas of uptake.  She is a non-smoker with low risk of malignancy.    Continued CT scan follow-up  Plan/Recommendations: - Check CBC differential, blood allergy profile, PFTs - CT without contrast in 6 months. - Follow up in 2-3 months  Marshell Garfinkel MD [facility] 06/24/2017, 9:14 AM  CC: Burnard Bunting, MD   Addendum: Received note from [facility] rheumatology dated 06/11/17 Followed up for rheumatoid arthritis, of Simponi since December 2018.  Holding further treatment of rheumatoid arthritis as symptoms continue to be stable.

## 2017-06-25 DIAGNOSIS — J441 Chronic obstructive pulmonary disease with (acute) exacerbation: Secondary | ICD-10-CM | POA: Diagnosis not present

## 2017-06-25 DIAGNOSIS — I10 Essential (primary) hypertension: Secondary | ICD-10-CM | POA: Diagnosis not present

## 2017-06-25 DIAGNOSIS — D649 Anemia, unspecified: Secondary | ICD-10-CM | POA: Diagnosis not present

## 2017-06-25 DIAGNOSIS — J9601 Acute respiratory failure with hypoxia: Secondary | ICD-10-CM | POA: Diagnosis not present

## 2017-06-25 DIAGNOSIS — E1165 Type 2 diabetes mellitus with hyperglycemia: Secondary | ICD-10-CM | POA: Diagnosis not present

## 2017-06-25 DIAGNOSIS — M069 Rheumatoid arthritis, unspecified: Secondary | ICD-10-CM | POA: Diagnosis not present

## 2017-06-25 LAB — RESPIRATORY ALLERGY PROFILE REGION II ~~LOC~~
ALLERGEN, MOUSE U PROTEIN, E72: 0.1 kU/L — AB
Allergen, A. alternata, m6: <0.1 kU/L
Allergen, Cedar tree, t12: <0.1 kU/L
Allergen, Comm Silver Birch, t9: <0.1 kU/L
Allergen, Cottonwood, t14: <0.1 kU/L
Allergen, Mulberry, t76: <0.1 kU/L
Allergen, P. notatum, m1: 0.11 kU/L — ABNORMAL HIGH
Aspergillus fumigatus, m3: 0.12 kU/L — ABNORMAL HIGH
Bermuda Grass: <0.1 kU/L
CLADOSPORIUM HERBARUM (M2) IGE: 0.12 kU/L — ABNORMAL HIGH
CLASS: 0
CLASS: 0
CLASS: 0
CLASS: 0
CLASS: 0
CLASS: 0
CLASS: 0
CLASS: 0
CLASS: 0
CLASS: 0
CLASS: 0
CLASS: 0
CLASS: 0
COMMON RAGWEED (SHORT) (W1) IGE: <0.1 kU/L
Cat Dander: 3.43 kU/L — ABNORMAL HIGH
Class: 0
Class: 0
Class: 0
Class: 0
Class: 0
Class: 0
Class: 0
Class: 0
Class: 0
Class: 0
Class: 2
Dog Dander: <0.1 kU/L
Elm IgE: <0.1 kU/L
IgE (Immunoglobulin E), Serum: 232 kU/L — ABNORMAL HIGH (ref <=114)
Johnson Grass: <0.1 kU/L
Pecan/Hickory Tree IgE: <0.1 kU/L
Rough Pigweed  IgE: <0.1 kU/L
Sheep Sorrel IgE: <0.1 kU/L

## 2017-06-25 LAB — INTERPRETATION:

## 2017-06-26 DIAGNOSIS — D649 Anemia, unspecified: Secondary | ICD-10-CM | POA: Diagnosis not present

## 2017-06-26 DIAGNOSIS — E1165 Type 2 diabetes mellitus with hyperglycemia: Secondary | ICD-10-CM | POA: Diagnosis not present

## 2017-06-26 DIAGNOSIS — I10 Essential (primary) hypertension: Secondary | ICD-10-CM | POA: Diagnosis not present

## 2017-06-26 DIAGNOSIS — M069 Rheumatoid arthritis, unspecified: Secondary | ICD-10-CM | POA: Diagnosis not present

## 2017-06-26 DIAGNOSIS — J441 Chronic obstructive pulmonary disease with (acute) exacerbation: Secondary | ICD-10-CM | POA: Diagnosis not present

## 2017-06-26 DIAGNOSIS — J9601 Acute respiratory failure with hypoxia: Secondary | ICD-10-CM | POA: Diagnosis not present

## 2017-07-02 DIAGNOSIS — J9601 Acute respiratory failure with hypoxia: Secondary | ICD-10-CM | POA: Diagnosis not present

## 2017-07-02 DIAGNOSIS — M069 Rheumatoid arthritis, unspecified: Secondary | ICD-10-CM | POA: Diagnosis not present

## 2017-07-02 DIAGNOSIS — I10 Essential (primary) hypertension: Secondary | ICD-10-CM | POA: Diagnosis not present

## 2017-07-02 DIAGNOSIS — E1165 Type 2 diabetes mellitus with hyperglycemia: Secondary | ICD-10-CM | POA: Diagnosis not present

## 2017-07-02 DIAGNOSIS — J441 Chronic obstructive pulmonary disease with (acute) exacerbation: Secondary | ICD-10-CM | POA: Diagnosis not present

## 2017-07-02 DIAGNOSIS — D649 Anemia, unspecified: Secondary | ICD-10-CM | POA: Diagnosis not present

## 2017-07-03 ENCOUNTER — Telehealth: Payer: Self-pay | Admitting: Pulmonary Disease

## 2017-07-03 DIAGNOSIS — E1165 Type 2 diabetes mellitus with hyperglycemia: Secondary | ICD-10-CM | POA: Diagnosis not present

## 2017-07-03 DIAGNOSIS — M069 Rheumatoid arthritis, unspecified: Secondary | ICD-10-CM | POA: Diagnosis not present

## 2017-07-03 DIAGNOSIS — J9601 Acute respiratory failure with hypoxia: Secondary | ICD-10-CM | POA: Diagnosis not present

## 2017-07-03 DIAGNOSIS — I10 Essential (primary) hypertension: Secondary | ICD-10-CM | POA: Diagnosis not present

## 2017-07-03 DIAGNOSIS — J441 Chronic obstructive pulmonary disease with (acute) exacerbation: Secondary | ICD-10-CM | POA: Diagnosis not present

## 2017-07-03 DIAGNOSIS — D649 Anemia, unspecified: Secondary | ICD-10-CM | POA: Diagnosis not present

## 2017-07-03 NOTE — Telephone Encounter (Signed)
Called and spoke with patient regarding returning message of results of labs and tests. Advised pt that all the results completed on 06/25/17 per Dr. Vaughan Browner have not been read by him at this time Pt verbalized understanding, and had no further questions at this time Advised pt that once the results are read that we will reach out to the pt with the full results and recommendations Nothing further needed at this time

## 2017-07-03 NOTE — Telephone Encounter (Signed)
Pt is calling back [phone]

## 2017-07-03 NOTE — Telephone Encounter (Signed)
lmtcb x1 for pt. 

## 2017-07-04 DIAGNOSIS — E1165 Type 2 diabetes mellitus with hyperglycemia: Secondary | ICD-10-CM | POA: Diagnosis not present

## 2017-07-04 DIAGNOSIS — D649 Anemia, unspecified: Secondary | ICD-10-CM | POA: Diagnosis not present

## 2017-07-04 DIAGNOSIS — J441 Chronic obstructive pulmonary disease with (acute) exacerbation: Secondary | ICD-10-CM | POA: Diagnosis not present

## 2017-07-04 DIAGNOSIS — I10 Essential (primary) hypertension: Secondary | ICD-10-CM | POA: Diagnosis not present

## 2017-07-04 DIAGNOSIS — J9601 Acute respiratory failure with hypoxia: Secondary | ICD-10-CM | POA: Diagnosis not present

## 2017-07-04 DIAGNOSIS — M069 Rheumatoid arthritis, unspecified: Secondary | ICD-10-CM | POA: Diagnosis not present

## 2017-07-07 DIAGNOSIS — J9601 Acute respiratory failure with hypoxia: Secondary | ICD-10-CM | POA: Diagnosis not present

## 2017-07-07 DIAGNOSIS — D649 Anemia, unspecified: Secondary | ICD-10-CM | POA: Diagnosis not present

## 2017-07-07 DIAGNOSIS — I10 Essential (primary) hypertension: Secondary | ICD-10-CM | POA: Diagnosis not present

## 2017-07-07 DIAGNOSIS — E1165 Type 2 diabetes mellitus with hyperglycemia: Secondary | ICD-10-CM | POA: Diagnosis not present

## 2017-07-07 DIAGNOSIS — J441 Chronic obstructive pulmonary disease with (acute) exacerbation: Secondary | ICD-10-CM | POA: Diagnosis not present

## 2017-07-07 DIAGNOSIS — M069 Rheumatoid arthritis, unspecified: Secondary | ICD-10-CM | POA: Diagnosis not present

## 2017-07-08 ENCOUNTER — Telehealth: Payer: Self-pay | Admitting: Pulmonary Disease

## 2017-07-08 DIAGNOSIS — J441 Chronic obstructive pulmonary disease with (acute) exacerbation: Secondary | ICD-10-CM | POA: Diagnosis not present

## 2017-07-08 DIAGNOSIS — J9611 Chronic respiratory failure with hypoxia: Secondary | ICD-10-CM

## 2017-07-08 DIAGNOSIS — I10 Essential (primary) hypertension: Secondary | ICD-10-CM | POA: Diagnosis not present

## 2017-07-08 DIAGNOSIS — M069 Rheumatoid arthritis, unspecified: Secondary | ICD-10-CM | POA: Diagnosis not present

## 2017-07-08 DIAGNOSIS — J9601 Acute respiratory failure with hypoxia: Secondary | ICD-10-CM | POA: Diagnosis not present

## 2017-07-08 DIAGNOSIS — D649 Anemia, unspecified: Secondary | ICD-10-CM | POA: Diagnosis not present

## 2017-07-08 DIAGNOSIS — E1165 Type 2 diabetes mellitus with hyperglycemia: Secondary | ICD-10-CM | POA: Diagnosis not present

## 2017-07-08 NOTE — Telephone Encounter (Signed)
ATC Beth, no answer. Left message for Beth to call back.  In the meanwhile, is it ok to place order for pulmonary rehab?

## 2017-07-09 DIAGNOSIS — J441 Chronic obstructive pulmonary disease with (acute) exacerbation: Secondary | ICD-10-CM | POA: Diagnosis not present

## 2017-07-09 DIAGNOSIS — E1165 Type 2 diabetes mellitus with hyperglycemia: Secondary | ICD-10-CM | POA: Diagnosis not present

## 2017-07-09 DIAGNOSIS — M069 Rheumatoid arthritis, unspecified: Secondary | ICD-10-CM | POA: Diagnosis not present

## 2017-07-09 DIAGNOSIS — I10 Essential (primary) hypertension: Secondary | ICD-10-CM | POA: Diagnosis not present

## 2017-07-09 DIAGNOSIS — D649 Anemia, unspecified: Secondary | ICD-10-CM | POA: Diagnosis not present

## 2017-07-09 DIAGNOSIS — J9601 Acute respiratory failure with hypoxia: Secondary | ICD-10-CM | POA: Diagnosis not present

## 2017-07-09 NOTE — Telephone Encounter (Signed)
Heidi Moran from [facility] returning call. Per Eustaquio Maize, she was calling to be sure that the request for Pulm Rehab has been sent to PM and if so, does not require a call back. Cb is [phone].

## 2017-07-10 DIAGNOSIS — J9601 Acute respiratory failure with hypoxia: Secondary | ICD-10-CM | POA: Diagnosis not present

## 2017-07-10 DIAGNOSIS — M069 Rheumatoid arthritis, unspecified: Secondary | ICD-10-CM | POA: Diagnosis not present

## 2017-07-10 DIAGNOSIS — I10 Essential (primary) hypertension: Secondary | ICD-10-CM | POA: Diagnosis not present

## 2017-07-10 DIAGNOSIS — E1165 Type 2 diabetes mellitus with hyperglycemia: Secondary | ICD-10-CM | POA: Diagnosis not present

## 2017-07-10 DIAGNOSIS — D649 Anemia, unspecified: Secondary | ICD-10-CM | POA: Diagnosis not present

## 2017-07-10 DIAGNOSIS — J441 Chronic obstructive pulmonary disease with (acute) exacerbation: Secondary | ICD-10-CM | POA: Diagnosis not present

## 2017-07-10 NOTE — Telephone Encounter (Signed)
Herbert Deaner physical therapist [phone]) called in regards to the request for pulmonary rehab for patient, patient is being discharged today from home health therapy

## 2017-07-10 NOTE — Telephone Encounter (Signed)
Spoke with Clair Gulling, PT at [facility], states that pt is being released from home health therapy today- would like pt to continue pulm rehab through [facility] rehab if possible.  Dr Vaughan Browner please advise if you're ok with prescribing pulm rehab for pt, and if so what dx you'd like to use.  Thanks!

## 2017-07-11 NOTE — Telephone Encounter (Signed)
Ok to order pulmonary rehab but I am not sure if she would be covered as she does not have COPD. Try dx of respiratory failure

## 2017-07-11 NOTE — Telephone Encounter (Signed)
Order placed for pulmonary rehab. Call back is not required according to message below.

## 2017-07-31 ENCOUNTER — Telehealth (HOSPITAL_COMMUNITY): Payer: Self-pay

## 2017-07-31 DIAGNOSIS — N183 Chronic kidney disease, stage 3 (moderate): Secondary | ICD-10-CM | POA: Diagnosis not present

## 2017-07-31 DIAGNOSIS — E11319 Type 2 diabetes mellitus with unspecified diabetic retinopathy without macular edema: Secondary | ICD-10-CM | POA: Diagnosis not present

## 2017-07-31 DIAGNOSIS — I129 Hypertensive chronic kidney disease with stage 1 through stage 4 chronic kidney disease, or unspecified chronic kidney disease: Secondary | ICD-10-CM | POA: Diagnosis not present

## 2017-07-31 DIAGNOSIS — K862 Cyst of pancreas: Secondary | ICD-10-CM | POA: Diagnosis not present

## 2017-07-31 DIAGNOSIS — R918 Other nonspecific abnormal finding of lung field: Secondary | ICD-10-CM | POA: Diagnosis not present

## 2017-07-31 DIAGNOSIS — Z6820 Body mass index (BMI) 20.0-20.9, adult: Secondary | ICD-10-CM | POA: Diagnosis not present

## 2017-07-31 DIAGNOSIS — M069 Rheumatoid arthritis, unspecified: Secondary | ICD-10-CM | POA: Diagnosis not present

## 2017-07-31 DIAGNOSIS — R2681 Unsteadiness on feet: Secondary | ICD-10-CM | POA: Diagnosis not present

## 2017-07-31 DIAGNOSIS — D8989 Other specified disorders involving the immune mechanism, not elsewhere classified: Secondary | ICD-10-CM | POA: Diagnosis not present

## 2017-07-31 DIAGNOSIS — M353 Polymyalgia rheumatica: Secondary | ICD-10-CM | POA: Diagnosis not present

## 2017-07-31 DIAGNOSIS — I10 Essential (primary) hypertension: Secondary | ICD-10-CM | POA: Diagnosis not present

## 2017-07-31 DIAGNOSIS — E1129 Type 2 diabetes mellitus with other diabetic kidney complication: Secondary | ICD-10-CM | POA: Diagnosis not present

## 2017-07-31 NOTE — Telephone Encounter (Signed)
Called patient in regards to Pulmonary Rehab - Scheduled orientation on 08/13/17 at 9:30am. Patient will attend the 10:30am exc class. Mailed packet.

## 2017-08-13 ENCOUNTER — Encounter (HOSPITAL_COMMUNITY)
Admission: RE | Admit: 2017-08-13 | Discharge: 2017-08-13 | Disposition: A | Discharge status: Home / Self Care | Payer: Medicare Other | Source: Ambulatory Visit | Attending: Pulmonary Disease | Admitting: Pulmonary Disease

## 2017-08-13 ENCOUNTER — Encounter (HOSPITAL_COMMUNITY): Payer: Self-pay

## 2017-08-13 VITALS — BP 127/54 | HR 79 | Temp 97.9°F | Resp 20 | Ht 59.25 in | Wt 114.2 lb

## 2017-08-13 DIAGNOSIS — M0579 Rheumatoid arthritis with rheumatoid factor of multiple sites without organ or systems involvement: Secondary | ICD-10-CM | POA: Diagnosis not present

## 2017-08-13 DIAGNOSIS — Z6821 Body mass index (BMI) 21.0-21.9, adult: Secondary | ICD-10-CM | POA: Diagnosis not present

## 2017-08-13 DIAGNOSIS — Z79899 Other long term (current) drug therapy: Secondary | ICD-10-CM | POA: Diagnosis not present

## 2017-08-13 DIAGNOSIS — J9611 Chronic respiratory failure with hypoxia: Secondary | ICD-10-CM | POA: Insufficient documentation

## 2017-08-13 DIAGNOSIS — M2550 Pain in unspecified joint: Secondary | ICD-10-CM | POA: Diagnosis not present

## 2017-08-13 NOTE — Progress Notes (Addendum)
Valentino Saxon 82 y.o. female Pulmonary Rehab Orientation Note Patient arrived today in Cardiac and Pulmonary Rehab for orientation to Pulmonary Rehab. She was transported from [employer] via wheel chair. She does carry portable oxygen. Per pt, she uses oxygen pulsed at [facility]ln[facility] when she is out in the community and when at home she uses 2L continuous with her oxygen concentrator at home. Pt completed 2 weeks of therapy at [facility] and per pt and husband made significant progress.  However pt did not continue to perform exercise that were recommended. Color good, skin warm and dry. Patient is oriented to time and place. Patient's medical history, psychosocial health, and medications reviewed. Psychosocial assessment reveals pt lives with their spouse. Pt is currently retired. Pt hobbies include nothing that pt could speak to.  Pt has past hobbies that she used to like to do but hasn't had any real hobbies for quite sometimes. Pt reports her stress level is high. Areas of stress/anxiety include Family intra strains.  Pt does  exhibit  signs of depression. Signs of depression include hopelessness and difficulty maintaining sleep. Pt has not dealt with issues that occurred in her teenage years and as a result pt remains stuck. PHQ2/9 score2 /9. Pt shows fair  coping skills with indifferent  outlook . Pt does not exhibit a negative or positive outlook.  Pt is indifferent about the future due to her age.  Pt offered emotional support and reassurance.  Pt is accompanied by her husband who talks very little.  Pt husband would occasionally answer questions but mostly remained quite.  Pt and wife stated that they are introvert by nature and seldom have conversations. Will continue to monitor and evaluate progress toward psychosocial goal(s) of not reliving the past or developing inter dialogue with her self about what could happen verses the reality of what has happened . Physical assessment reveals heart rate is normal,  breath sounds clear to auscultation, no wheezes, rales, or rhonchi. Grip strength equal, strong. Distal pulses palpable. Patient reports she does take medications as prescribed. Patient states she follows a Regular diet but has a poor appetite. The patient reports no specific efforts to gain or lose weight..However pt is at her lowest weight and would like to gain 5 pounds.  Patient's weight will be monitored closely. Demonstration and practice of PLB using pulse oximeter. Patient able to return demonstration satisfactorily. Safety and hand hygiene in the exercise area reviewed with patient. Patient voices understanding of the information reviewed. Department expectations discussed with patient and achievable goals were set. The patient shows enthusiasm about attending the program and we look forward to working with this nice patient The patient is scheduled for a 6 min walk test on 5/30 and to begin exercise on 6/6.  45 minutes was spent on a variety of activities such as assessment of the patient, obtaining baseline data including height, weight, BMI, and grip strength, verifying medical history, allergies, and current medications, and teaching patient strategies for performing tasks with less respiratory effort with emphasis on pursed lip breathing. [id number] [place], BSN Cardiac and Pulmonary Rehab Nurse Navigator

## 2017-08-14 ENCOUNTER — Encounter (HOSPITAL_COMMUNITY)
Admission: RE | Admit: 2017-08-14 | Discharge: 2017-08-14 | Disposition: A | Discharge status: Home / Self Care | Payer: Medicare Other | Source: Ambulatory Visit | Attending: Pulmonary Disease | Admitting: Pulmonary Disease

## 2017-08-14 DIAGNOSIS — J9611 Chronic respiratory failure with hypoxia: Secondary | ICD-10-CM | POA: Diagnosis not present

## 2017-08-15 NOTE — Progress Notes (Signed)
Pulmonary Individual Treatment Plan  Patient Details  Name: Heidi Moran MRN: [id number] Date of Birth: January 25, 1936 Referring Provider:     Pulmonary Rehab Walk Test from 08/14/2017 in [place]  Referring Provider  Dr. Vaughan Browner      Initial Encounter Date:    Pulmonary Rehab Walk Test from 08/14/2017 in [place]  Date  08/15/17  Referring Provider  Dr. Vaughan Browner      Visit Diagnosis: Chronic respiratory failure with hypoxia ([place])  Patient's Home Medications on Admission:   Current Outpatient Medications:  .  atorvastatin (LIPITOR) 20 MG tablet, Take 20 mg by mouth daily., Disp: , Rfl:  .  Calcium-Vitamin D-Vitamin K (VIACTIV PO), Take 1 tablet by mouth daily., Disp: , Rfl:  .  glyBURIDE-metformin (GLUCOVANCE) 2.5-500 MG tablet, Take 1 tablet by mouth daily with breakfast. Pt takes one in the morning with breakfast and one in the evening, Disp: , Rfl:  .  Golimumab (SIMPONI ARIA IV), Inject into the vein., Disp: , Rfl:  .  ipratropium-albuterol (DUONEB) 0.5-2.5 (3) MG/3ML SOLN, Take 3 mLs by nebulization every 4 (four) hours as needed. (Patient not taking: Reported on 06/24/2017), Disp: 360 mL, Rfl:  .  lisinopril (PRINIVIL,ZESTRIL) 10 MG tablet, Take 10 mg by mouth daily., Disp: , Rfl:  .  predniSONE (DELTASONE) 10 MG tablet, 40 mg daily x 3 days then 20 mg daily x 3 days then 10 mg daily x 3 days (Patient not taking: Reported on 06/24/2017), Disp: 30 tablet, Rfl: 0  Past Medical History: Past Medical History:  Diagnosis Date  . Anemia   . Diabetes mellitus without complication ([place])   . Eczema   . Hyperlipidemia   . Hypertension   . Osteoarthritis   . Renal disorder     Tobacco Use: Social History   Tobacco Use  Smoking Status Never Smoker  Smokeless Tobacco Never Used    Labs: Recent Review Scientist, physiological    Labs for ITP Cardiac and Pulmonary Rehab Latest Ref Rng & Units 11/03/2015 01/28/2017   TCO2 22 - 32  mmol/L 26 26      Capillary Blood Glucose: Lab Results  Component Value Date   GLUCAP 295 (H) 05/21/2017   GLUCAP 115 (H) 05/21/2017   GLUCAP 465 (H) 05/20/2017   GLUCAP 295 (H) 05/20/2017   GLUCAP 216 (H) 05/20/2017     Pulmonary Assessment Scores: Pulmonary Assessment Scores    Row Name 08/15/17 0842         ADL UCSD   ADL Phase  Entry       mMRC Score   mMRC Score  1        Pulmonary Function Assessment:   Exercise Target Goals: Date: 08/15/17  Exercise Program Goal: Individual exercise prescription set using results from initial 6 min walk test and THRR while considering  patient's activity barriers and safety.    Exercise Prescription Goal: Initial exercise prescription builds to 30-45 minutes a day of aerobic activity, 2-3 days per week.  Home exercise guidelines will be given to patient during program as part of exercise prescription that the participant will acknowledge.  Activity Barriers & Risk Stratification: Activity Barriers & Cardiac Risk Stratification - 08/13/17 1210      Activity Barriers & Cardiac Risk Stratification   Activity Barriers  Arthritis;Balance Concerns;History of Falls;Shortness of Breath;Deconditioning;Joint Problems       6 Minute Walk: 6 Minute Walk    Row Name 08/15/17 [phone]  6 Minute Walk   Phase  Initial     Distance  1000 feet     Walk Time  6 minutes     # of Rest Breaks  0     MPH  1.89     METS  2.45     RPE  13     Perceived Dyspnea   3     Symptoms  Yes (comment)     Comments  used wheelchair     Resting HR  86 bpm     Resting BP  118/58     Resting Oxygen Saturation   97 %     Exercise Oxygen Saturation  during 6 min walk  92 %     Max Ex. HR  107 bpm     Max Ex. BP  140/60       Interval HR   1 Minute HR  97     2 Minute HR  102     3 Minute HR  105     4 Minute HR  107     5 Minute HR  107     6 Minute HR  107     2 Minute Post HR  103     Interval Heart Rate?  Yes       Interval  Oxygen   Interval Oxygen?  Yes     Baseline Oxygen Saturation %  97 %     1 Minute Oxygen Saturation %  100 %     1 Minute Liters of Oxygen  2 L     2 Minute Oxygen Saturation %  99 %     2 Minute Liters of Oxygen  2 L     3 Minute Oxygen Saturation %  97 %     3 Minute Liters of Oxygen  2 L     4 Minute Oxygen Saturation %  93 %     4 Minute Liters of Oxygen  2 L     5 Minute Oxygen Saturation %  93 %     5 Minute Liters of Oxygen  2 L     6 Minute Oxygen Saturation %  92 %     6 Minute Liters of Oxygen  2 L     2 Minute Post Oxygen Saturation %  96 %     2 Minute Post Liters of Oxygen  2 L        Oxygen Initial Assessment: Oxygen Initial Assessment - 08/15/17 0839      Initial 6 min Walk   Oxygen Used  Continuous;E-Tanks    Liters per minute  2      Program Oxygen Prescription   Program Oxygen Prescription  Continuous;E-Tanks    Liters per minute  2       Oxygen Re-Evaluation:   Oxygen Discharge (Final Oxygen Re-Evaluation):   Initial Exercise Prescription: Initial Exercise Prescription - 08/15/17 0800      Date of Initial Exercise RX and Referring Provider   Date  08/15/17    Referring Provider  Dr. Vaughan Browner      Oxygen   Oxygen  Continuous    Liters  2      Recumbant Bike   Level  1    Watts  10    Minutes  17      NuStep   Level  2    SPM  80    Minutes  17    METs  1.5      Track   Laps  5    Minutes  17      Prescription Details   Frequency (times per week)  2    Duration  Progress to 45 minutes of aerobic exercise without signs/symptoms of physical distress      Intensity   THRR 40-80% of Max Heartrate  56-113    Ratings of Perceived Exertion  11-13    Perceived Dyspnea  0-4      Progression   Progression  Continue to progress workloads to maintain intensity without signs/symptoms of physical distress.      Resistance Training   Training Prescription  Yes    Weight  orange bands    Reps  10-15       Perform Capillary Blood  Glucose checks as needed.  Exercise Prescription Changes:   Exercise Comments:   Exercise Goals and Review: Exercise Goals    Row Name 08/13/17 1113             Exercise Goals   Increase Physical Activity  Yes       Intervention  Provide advice, education, support and counseling about physical activity/exercise needs.;Develop an individualized exercise prescription for aerobic and resistive training based on initial evaluation findings, risk stratification, comorbidities and participant's personal goals.       Expected Outcomes  Long Term: Exercising regularly at least 3-5 days a week.;Long Term: Add in home exercise to make exercise part of routine and to increase amount of physical activity.;Short Term: Attend rehab on a regular basis to increase amount of physical activity.       Increase Strength and Stamina  Yes       Intervention  Provide advice, education, support and counseling about physical activity/exercise needs.;Develop an individualized exercise prescription for aerobic and resistive training based on initial evaluation findings, risk stratification, comorbidities and participant's personal goals.       Expected Outcomes  Short Term: Increase workloads from initial exercise prescription for resistance, speed, and METs.;Short Term: Perform resistance training exercises routinely during rehab and add in resistance training at home;Long Term: Improve cardiorespiratory fitness, muscular endurance and strength as measured by increased METs and functional capacity (6MWT)       Able to understand and use rate of perceived exertion (RPE) scale  Yes       Intervention  Provide education and explanation on how to use RPE scale       Expected Outcomes  Short Term: Able to use RPE daily in rehab to express subjective intensity level;Long Term:  Able to use RPE to guide intensity level when exercising independently       Able to understand and use Dyspnea scale  Yes       Intervention   Provide education and explanation on how to use Dyspnea scale       Expected Outcomes  Short Term: Able to use Dyspnea scale daily in rehab to express subjective sense of shortness of breath during exertion;Long Term: Able to use Dyspnea scale to guide intensity level when exercising independently       Knowledge and understanding of Target Heart Rate Range (THRR)  Yes       Intervention  Provide education and explanation of THRR including how the numbers were predicted and where they are located for reference       Expected Outcomes  Short Term: Able to state/look up THRR;Long Term: Able to use THRR to govern intensity when exercising  independently;Short Term: Able to use daily as guideline for intensity in rehab       Able to check pulse independently  Yes       Intervention  Provide education and demonstration on how to check pulse in carotid and radial arteries.;Review the importance of being able to check your own pulse for safety during independent exercise       Expected Outcomes  Short Term: Able to explain why pulse checking is important during independent exercise;Long Term: Able to check pulse independently and accurately       Understanding of Exercise Prescription  Yes       Intervention  Provide education, explanation, and written materials on patient's individual exercise prescription       Expected Outcomes  Short Term: Able to explain program exercise prescription;Long Term: Able to explain home exercise prescription to exercise independently          Exercise Goals Re-Evaluation :   Discharge Exercise Prescription (Final Exercise Prescription Changes):   Nutrition:  Target Goals: Understanding of nutrition guidelines, daily intake of sodium 1500mg , cholesterol 200mg , calories 30% from fat and 7% or less from saturated fats, daily to have 5 or more servings of fruits and vegetables.  Biometrics:    Nutrition Therapy Plan and Nutrition Goals:   Nutrition  Assessments:   Nutrition Goals Re-Evaluation:   Nutrition Goals Discharge (Final Nutrition Goals Re-Evaluation):   Psychosocial: Target Goals: Acknowledge presence or absence of significant depression and/or stress, maximize coping skills, provide positive support system. Participant is able to verbalize types and ability to use techniques and skills needed for reducing stress and depression.  Initial Review & Psychosocial Screening: Initial Psych Review & Screening - 08/13/17 1044      Initial Review   Current issues with  Current Stress Concerns;History of Depression    Source of Stress Concerns  Unable to participate in former interests or hobbies;Unable to perform yard/household activities    Comments  intra family strain      [employer]   Good Support System?  Yes      Barriers   Psychosocial barriers to participate in program  The patient should benefit from training in stress management and relaxation.      Screening Interventions   Interventions  Encouraged to exercise    Expected Outcomes  Short Term goal: Identification and review with participant of any Quality of Life or Depression concerns found by scoring the questionnaire.;Long Term goal: The participant improves quality of Life and PHQ9 Scores as seen by post scores and/or verbalization of changes       Quality of Life Scores:  Scores of 19 and below usually indicate a poorer quality of life in these areas.  A difference of  2-3 points is a clinically meaningful difference.  A difference of 2-3 points in the total score of the Quality of Life Index has been associated with significant improvement in overall quality of life, self-image, physical symptoms, and general health in studies assessing change in quality of life.   PHQ-9: Recent Review Flowsheet Data    Depression screen [facility] 2/9 08/13/2017   Decreased Interest 1   Down, Depressed, Hopeless 1   PHQ - 2 Score 2   Altered sleeping 1   Tired,  decreased energy 1   Change in appetite 2   Feeling bad or failure about yourself  1   Trouble concentrating 1   Moving slowly or fidgety/restless 1   Suicidal thoughts 0  PHQ-9 Score 9   Difficult doing work/chores Somewhat difficult     Interpretation of Total Score  Total Score Depression Severity:  1-4 = Minimal depression, 5-9 = Mild depression, 10-14 = Moderate depression, 15-19 = Moderately severe depression, 20-27 = Severe depression   Psychosocial Evaluation and Intervention: Psychosocial Evaluation - 08/13/17 1208      Psychosocial Evaluation & Interventions   Interventions  Stress management education;Relaxation education;Encouraged to exercise with the program and follow exercise prescription    Comments  Pt with prior intra family strain.  Both pt and husband are introverts and have little interaction with each other in the home    Continue Psychosocial Services   Follow up required by staff       Psychosocial Re-Evaluation:   Psychosocial Discharge (Final Psychosocial Re-Evaluation):   Education: Education Goals: Education classes will be provided on a weekly basis, covering required topics. Participant will state understanding/return demonstration of topics presented.  Learning Barriers/Preferences: Learning Barriers/Preferences - 08/13/17 1050      Learning Barriers/Preferences   Learning Barriers  Sight;Hearing    Learning Preferences  Computer/Internet;Verbal Instruction;Individual Instruction;Group Instruction       Education Topics: Risk Factor Reduction:  -Group instruction that is supported by a PowerPoint presentation. Instructor discusses the definition of a risk factor, different risk factors for pulmonary disease, and how the heart and lungs work together.     Nutrition for Pulmonary Patient:  -Group instruction provided by PowerPoint slides, verbal discussion, and written materials to support subject matter. The instructor gives an  explanation and review of healthy diet recommendations, which includes a discussion on weight management, recommendations for fruit and vegetable consumption, as well as protein, fluid, caffeine, fiber, sodium, sugar, and alcohol. Tips for eating when patients are short of breath are discussed.   Pursed Lip Breathing:  -Group instruction that is supported by demonstration and informational handouts. Instructor discusses the benefits of pursed lip and diaphragmatic breathing and detailed demonstration on how to preform both.     Oxygen Safety:  -Group instruction provided by PowerPoint, verbal discussion, and written material to support subject matter. There is an overview of "What is Oxygen" and "Why do we need it".  Instructor also reviews how to create a safe environment for oxygen use, the importance of using oxygen as prescribed, and the risks of noncompliance. There is a brief discussion on traveling with oxygen and resources the patient may utilize.   Oxygen Equipment:  -Group instruction provided by [facility] Staff utilizing handouts, written materials, and equipment demonstrations.   Signs and Symptoms:  -Group instruction provided by written material and verbal discussion to support subject matter. Warning signs and symptoms of infection, stroke, and heart attack are reviewed and when to call the physician/911 reinforced. Tips for preventing the spread of infection discussed.   Advanced Directives:  -Group instruction provided by verbal instruction and written material to support subject matter. Instructor reviews Advanced Directive laws and proper instruction for filling out document.   Pulmonary Video:  -Group video education that reviews the importance of medication and oxygen compliance, exercise, good nutrition, pulmonary hygiene, and pursed lip and diaphragmatic breathing for the pulmonary patient.   Exercise for the Pulmonary Patient:  -Group instruction that is  supported by a PowerPoint presentation. Instructor discusses benefits of exercise, core components of exercise, frequency, duration, and intensity of an exercise routine, importance of utilizing pulse oximetry during exercise, safety while exercising, and options of places to exercise outside of rehab.  Pulmonary Medications:  -Verbally interactive group education provided by instructor with focus on inhaled medications and proper administration.   Anatomy and Physiology of the Respiratory System and Intimacy:  -Group instruction provided by PowerPoint, verbal discussion, and written material to support subject matter. Instructor reviews respiratory cycle and anatomical components of the respiratory system and their functions. Instructor also reviews differences in obstructive and restrictive respiratory diseases with examples of each. Intimacy, Sex, and Sexuality differences are reviewed with a discussion on how relationships can change when diagnosed with pulmonary disease. Common sexual concerns are reviewed.   MD DAY -A group question and answer session with a medical doctor that allows participants to ask questions that relate to their pulmonary disease state.   OTHER EDUCATION -Group or individual verbal, written, or video instructions that support the educational goals of the pulmonary rehab program.   Holiday Eating Survival Tips:  -Group instruction provided by PowerPoint slides, verbal discussion, and written materials to support subject matter. The instructor gives patients tips, tricks, and techniques to help them not only survive but enjoy the holidays despite the onslaught of food that accompanies the holidays.   Knowledge Questionnaire Score:   Core Components/Risk Factors/Patient Goals at Admission: Personal Goals and Risk Factors at Admission - 08/13/17 1023      Core Components/Risk Factors/Patient Goals on Admission    Weight Management  Yes;Weight Gain     Intervention  Weight Management: Develop a combined nutrition and exercise program designed to reach desired caloric intake, while maintaining appropriate intake of nutrient and fiber, sodium and fats, and appropriate energy expenditure required for the weight goal.;Weight Management: Provide education and appropriate resources to help participant work on and attain dietary goals.;Weight Management/Obesity: Establish reasonable short term and long term weight goals.;Obesity: Provide education and appropriate resources to help participant work on and attain dietary goals.    Admit Weight  114 lb 3.2 oz (51.8 kg)    Goal Weight: Short Term  118 lb (53.5 kg)    Goal Weight: Long Term  120 lb (54.4 kg)    Expected Outcomes  Short Term: Continue to assess and modify interventions until short term weight is achieved;Understanding of distribution of calorie intake throughout the day with the consumption of 4-5 meals/snacks;Weight Gain: Understanding of general recommendations for a high calorie, high protein meal plan that promotes weight gain by distributing calorie intake throughout the day with the consumption for 4-5 meals, snacks, and/or supplements;Long Term: Adherence to nutrition and physical activity/exercise program aimed toward attainment of established weight goal    Improve shortness of breath with ADL's  Yes    Intervention  Provide education, individualized exercise plan and daily activity instruction to help decrease symptoms of SOB with activities of daily living.    Expected Outcomes  Short Term: Improve cardiorespiratory fitness to achieve a reduction of symptoms when performing ADLs;Long Term: Be able to perform more ADLs without symptoms or delay the onset of symptoms    Diabetes  Yes    Intervention  Provide education about signs/symptoms and action to take for hypo/hyperglycemia.;Provide education about proper nutrition, including hydration, and aerobic/resistive exercise prescription along  with prescribed medications to achieve blood glucose in normal ranges: Fasting glucose 65-99 mg/dL    Expected Outcomes  Short Term: Participant verbalizes understanding of the signs/symptoms and immediate care of hyper/hypoglycemia, proper foot care and importance of medication, aerobic/resistive exercise and nutrition plan for blood glucose control.;Long Term: Attainment of HbA1C < 7%.    Lipids  Yes  Intervention  Provide education and support for participant on nutrition & aerobic/resistive exercise along with prescribed medications to achieve LDL 70mg , HDL >40mg .    Expected Outcomes  Short Term: Participant states understanding of desired cholesterol values and is compliant with medications prescribed. Participant is following exercise prescription and nutrition guidelines.;Long Term: Cholesterol controlled with medications as prescribed, with individualized exercise RX and with personalized nutrition plan. Value goals: LDL < 70mg , HDL > 40 mg.    Stress  Yes    Intervention  Offer individual and/or small group education and counseling on adjustment to heart disease, stress management and health-related lifestyle change. Teach and support self-help strategies.    Expected Outcomes  Short Term: Participant demonstrates changes in health-related behavior, relaxation and other stress management skills, ability to obtain effective social support, and compliance with psychotropic medications if prescribed.;Long Term: Emotional wellbeing is indicated by absence of clinically significant psychosocial distress or social isolation.       Core Components/Risk Factors/Patient Goals Review:    Core Components/Risk Factors/Patient Goals at Discharge (Final Review):    ITP Comments: ITP Comments    Row Name 08/13/17 1013           ITP Comments  Dr. Jennet Maduro, Medical Director          Comments:

## 2017-08-18 NOTE — Progress Notes (Signed)
Heidi Moran 82 y.o. female  DOB: September 28, 1935 MRN: [id number]           Nutrition Note 1. Chronic respiratory failure with hypoxia [facility])    Past Medical History:  Diagnosis Date  . Anemia   . Diabetes mellitus without complication ([place])   . Eczema   . Hyperlipidemia   . Hypertension   . Osteoarthritis   . Renal disorder    Meds reviewed. Glyburide-Metformin noted  Ht: Ht Readings from Last 1 Encounters:  08/13/17 4' 11.25" (1.505 m)    Wt:  Wt Readings from Last 3 Encounters:  08/13/17 114 lb 3.2 oz (51.8 kg)  06/24/17 114 lb (51.7 kg)  05/21/17 116 lb 13.5 oz (53 kg)    BMI: 22.87    Current tobacco use? No  Labs:  Lipid Panel  No results found for: CHOL, TRIG, HDL, CHOLHDL, VLDL, LDLCALC, LDLDIRECT  No results found for: HGBA1C  Nutrition Diagnosis ? Food-and nutrition-related knowledge deficit related to lack of exposure to information as related to diagnosis of pulmonary disease  Goal(s) 1. To be determined with pt  Plan:  Pt to attend Pulmonary Nutrition class Will provide client-centered nutrition education as part of interdisciplinary care.   Monitor and evaluate progress toward nutrition goal with team.  Monitor and Evaluate progress toward nutrition goal with team.   Derek Mound, M.Ed, RD, LDN, CDE 08/18/2017 11:47 AM

## 2017-08-20 NOTE — Progress Notes (Signed)
Pulmonary Individual Treatment Plan  Patient Details  Name: Heidi Moran MRN: [id number] Date of Birth: 1936/01/17 Referring Provider:     Pulmonary Rehab Walk Test from 08/14/2017 in [facility]  Referring Provider  Dr. Vaughan Browner      Initial Encounter Date:    Pulmonary Rehab Walk Test from 08/14/2017 in [facility]  Date  08/15/17  Referring Provider  Dr. Vaughan Browner      Visit Diagnosis: Chronic respiratory failure with hypoxia ([place])  Patient's Home Medications on Admission:   Current Outpatient Medications:  .  atorvastatin (LIPITOR) 20 MG tablet, Take 20 mg by mouth daily., Disp: , Rfl:  .  Calcium-Vitamin D-Vitamin K (VIACTIV PO), Take 1 tablet by mouth daily., Disp: , Rfl:  .  glyBURIDE-metformin (GLUCOVANCE) 2.5-500 MG tablet, Take 1 tablet by mouth daily with breakfast. Pt takes one in the morning with breakfast and one in the evening, Disp: , Rfl:  .  Golimumab (SIMPONI ARIA IV), Inject into the vein., Disp: , Rfl:  .  ipratropium-albuterol (DUONEB) 0.5-2.5 (3) MG/3ML SOLN, Take 3 mLs by nebulization every 4 (four) hours as needed. (Patient not taking: Reported on 06/24/2017), Disp: 360 mL, Rfl:  .  lisinopril (PRINIVIL,ZESTRIL) 10 MG tablet, Take 10 mg by mouth daily., Disp: , Rfl:  .  predniSONE (DELTASONE) 10 MG tablet, 40 mg daily x 3 days then 20 mg daily x 3 days then 10 mg daily x 3 days (Patient not taking: Reported on 06/24/2017), Disp: 30 tablet, Rfl: 0  Past Medical History: Past Medical History:  Diagnosis Date  . Anemia   . Diabetes mellitus without complication ([place])   . Eczema   . Hyperlipidemia   . Hypertension   . Osteoarthritis   . Renal disorder     Tobacco Use: Social History   Tobacco Use  Smoking Status Never Smoker  Smokeless Tobacco Never Used    Labs: Recent Review Scientist, physiological    Labs for ITP Cardiac and Pulmonary Rehab Latest Ref Rng & Units 11/03/2015 01/28/2017   TCO2 22 - 32  mmol/L 26 26      Capillary Blood Glucose: Lab Results  Component Value Date   GLUCAP 295 (H) 05/21/2017   GLUCAP 115 (H) 05/21/2017   GLUCAP 465 (H) 05/20/2017   GLUCAP 295 (H) 05/20/2017   GLUCAP 216 (H) 05/20/2017     Pulmonary Assessment Scores: Pulmonary Assessment Scores    Row Name 08/15/17 0842         ADL UCSD   ADL Phase  Entry       mMRC Score   mMRC Score  1        Pulmonary Function Assessment:   Exercise Target Goals: Date: 08/15/17  Exercise Program Goal: Individual exercise prescription set using results from initial 6 min walk test and THRR while considering  patient's activity barriers and safety.    Exercise Prescription Goal: Initial exercise prescription builds to 30-45 minutes a day of aerobic activity, 2-3 days per week.  Home exercise guidelines will be given to patient during program as part of exercise prescription that the participant will acknowledge.  Activity Barriers & Risk Stratification: Activity Barriers & Cardiac Risk Stratification - 08/13/17 1210      Activity Barriers & Cardiac Risk Stratification   Activity Barriers  Arthritis;Balance Concerns;History of Falls;Shortness of Breath;Deconditioning;Joint Problems       6 Minute Walk: 6 Minute Walk    Row Name 08/15/17 [phone]  6 Minute Walk   Phase  Initial     Distance  1000 feet     Walk Time  6 minutes     # of Rest Breaks  0     MPH  1.89     METS  2.45     RPE  13     Perceived Dyspnea   3     Symptoms  Yes (comment)     Comments  used wheelchair     Resting HR  86 bpm     Resting BP  118/58     Resting Oxygen Saturation   97 %     Exercise Oxygen Saturation  during 6 min walk  92 %     Max Ex. HR  107 bpm     Max Ex. BP  140/60       Interval HR   1 Minute HR  97     2 Minute HR  102     3 Minute HR  105     4 Minute HR  107     5 Minute HR  107     6 Minute HR  107     2 Minute Post HR  103     Interval Heart Rate?  Yes       Interval  Oxygen   Interval Oxygen?  Yes     Baseline Oxygen Saturation %  97 %     1 Minute Oxygen Saturation %  100 %     1 Minute Liters of Oxygen  2 L     2 Minute Oxygen Saturation %  99 %     2 Minute Liters of Oxygen  2 L     3 Minute Oxygen Saturation %  97 %     3 Minute Liters of Oxygen  2 L     4 Minute Oxygen Saturation %  93 %     4 Minute Liters of Oxygen  2 L     5 Minute Oxygen Saturation %  93 %     5 Minute Liters of Oxygen  2 L     6 Minute Oxygen Saturation %  92 %     6 Minute Liters of Oxygen  2 L     2 Minute Post Oxygen Saturation %  96 %     2 Minute Post Liters of Oxygen  2 L        Oxygen Initial Assessment: Oxygen Initial Assessment - 08/15/17 0839      Initial 6 min Walk   Oxygen Used  Continuous;E-Tanks    Liters per minute  2      Program Oxygen Prescription   Program Oxygen Prescription  Continuous;E-Tanks    Liters per minute  2       Oxygen Re-Evaluation: Oxygen Re-Evaluation    Row Name 08/20/17 1526             Program Oxygen Prescription   Program Oxygen Prescription  Continuous;E-Tanks       Liters per minute  2         Home Oxygen   Home Oxygen Device  Portable Concentrator;Home Concentrator       Sleep Oxygen Prescription  Continuous       Liters per minute  2       Home Exercise Oxygen Prescription  Continuous       Liters per minute  2  Home at Rest Exercise Oxygen Prescription  Pulsed       Compliance with Home Oxygen Use  Yes         Goals/Expected Outcomes   Short Term Goals  To learn and exhibit compliance with exercise, home and travel O2 prescription;To learn and understand importance of maintaining oxygen saturations>88%;To learn and demonstrate proper use of respiratory medications;To learn and understand importance of monitoring SPO2 with pulse oximeter and demonstrate accurate use of the pulse oximeter.;To learn and demonstrate proper pursed lip breathing techniques or other breathing techniques.       Long  Term  Goals  Exhibits compliance with exercise, home and travel O2 prescription;Verbalizes importance of monitoring SPO2 with pulse oximeter and return demonstration;Maintenance of O2 saturations>88%;Exhibits proper breathing techniques, such as pursed lip breathing or other method taught during program session;Compliance with respiratory medication          Oxygen Discharge (Final Oxygen Re-Evaluation): Oxygen Re-Evaluation - 08/20/17 1526      Program Oxygen Prescription   Program Oxygen Prescription  Continuous;E-Tanks    Liters per minute  2      Home Oxygen   Home Oxygen Device  Portable Concentrator;Home Concentrator    Sleep Oxygen Prescription  Continuous    Liters per minute  2    Home Exercise Oxygen Prescription  Continuous    Liters per minute  2    Home at Rest Exercise Oxygen Prescription  Pulsed    Compliance with Home Oxygen Use  Yes      Goals/Expected Outcomes   Short Term Goals  To learn and exhibit compliance with exercise, home and travel O2 prescription;To learn and understand importance of maintaining oxygen saturations>88%;To learn and demonstrate proper use of respiratory medications;To learn and understand importance of monitoring SPO2 with pulse oximeter and demonstrate accurate use of the pulse oximeter.;To learn and demonstrate proper pursed lip breathing techniques or other breathing techniques.    Long  Term Goals  Exhibits compliance with exercise, home and travel O2 prescription;Verbalizes importance of monitoring SPO2 with pulse oximeter and return demonstration;Maintenance of O2 saturations>88%;Exhibits proper breathing techniques, such as pursed lip breathing or other method taught during program session;Compliance with respiratory medication       Initial Exercise Prescription: Initial Exercise Prescription - 08/15/17 0800      Date of Initial Exercise RX and Referring Provider   Date  08/15/17    Referring Provider  Dr. Vaughan Browner      Oxygen   Oxygen   Continuous    Liters  2      Recumbant Bike   Level  1    Watts  10    Minutes  17      NuStep   Level  2    SPM  80    Minutes  17    METs  1.5      Track   Laps  5    Minutes  17      Prescription Details   Frequency (times per week)  2    Duration  Progress to 45 minutes of aerobic exercise without signs/symptoms of physical distress      Intensity   THRR 40-80% of Max Heartrate  56-113    Ratings of Perceived Exertion  11-13    Perceived Dyspnea  0-4      Progression   Progression  Continue to progress workloads to maintain intensity without signs/symptoms of physical distress.      Resistance Training  Training Prescription  Yes    Weight  orange bands    Reps  10-15       Perform Capillary Blood Glucose checks as needed.  Exercise Prescription Changes:   Exercise Comments:   Exercise Goals and Review:  Exercise Goals    Row Name 08/13/17 1113             Exercise Goals   Increase Physical Activity  Yes       Intervention  Provide advice, education, support and counseling about physical activity/exercise needs.;Develop an individualized exercise prescription for aerobic and resistive training based on initial evaluation findings, risk stratification, comorbidities and participant's personal goals.       Expected Outcomes  Long Term: Exercising regularly at least 3-5 days a week.;Long Term: Add in home exercise to make exercise part of routine and to increase amount of physical activity.;Short Term: Attend rehab on a regular basis to increase amount of physical activity.       Increase Strength and Stamina  Yes       Intervention  Provide advice, education, support and counseling about physical activity/exercise needs.;Develop an individualized exercise prescription for aerobic and resistive training based on initial evaluation findings, risk stratification, comorbidities and participant's personal goals.       Expected Outcomes  Short Term: Increase  workloads from initial exercise prescription for resistance, speed, and METs.;Short Term: Perform resistance training exercises routinely during rehab and add in resistance training at home;Long Term: Improve cardiorespiratory fitness, muscular endurance and strength as measured by increased METs and functional capacity (6MWT)       Able to understand and use rate of perceived exertion (RPE) scale  Yes       Intervention  Provide education and explanation on how to use RPE scale       Expected Outcomes  Short Term: Able to use RPE daily in rehab to express subjective intensity level;Long Term:  Able to use RPE to guide intensity level when exercising independently       Able to understand and use Dyspnea scale  Yes       Intervention  Provide education and explanation on how to use Dyspnea scale       Expected Outcomes  Short Term: Able to use Dyspnea scale daily in rehab to express subjective sense of shortness of breath during exertion;Long Term: Able to use Dyspnea scale to guide intensity level when exercising independently       Knowledge and understanding of Target Heart Rate Range (THRR)  Yes       Intervention  Provide education and explanation of THRR including how the numbers were predicted and where they are located for reference       Expected Outcomes  Short Term: Able to state/look up THRR;Long Term: Able to use THRR to govern intensity when exercising independently;Short Term: Able to use daily as guideline for intensity in rehab       Able to check pulse independently  Yes       Intervention  Provide education and demonstration on how to check pulse in carotid and radial arteries.;Review the importance of being able to check your own pulse for safety during independent exercise       Expected Outcomes  Short Term: Able to explain why pulse checking is important during independent exercise;Long Term: Able to check pulse independently and accurately       Understanding of Exercise  Prescription  Yes       Intervention  Provide education, explanation, and written materials on patient's individual exercise prescription       Expected Outcomes  Short Term: Able to explain program exercise prescription;Long Term: Able to explain home exercise prescription to exercise independently          Exercise Goals Re-Evaluation : Exercise Goals Re-Evaluation    Row Name 08/20/17 1531             Exercise Goal Re-Evaluation   Exercise Goals Review  Increase Physical Activity;Able to understand and use rate of perceived exertion (RPE) scale;Knowledge and understanding of Target Heart Rate Range (THRR);Understanding of Exercise Prescription;Increase Strength and Stamina;Able to understand and use Dyspnea scale          Discharge Exercise Prescription (Final Exercise Prescription Changes):   Nutrition:  Target Goals: Understanding of nutrition guidelines, daily intake of sodium 1500mg , cholesterol 200mg , calories 30% from fat and 7% or less from saturated fats, daily to have 5 or more servings of fruits and vegetables.  Biometrics:    Nutrition Therapy Plan and Nutrition Goals: Nutrition Therapy & Goals - 08/18/17 1149      Nutrition Therapy   Diet  Consistent Carb, Heart Healthy      Intervention Plan   Intervention  Prescribe, educate and counsel regarding individualized specific dietary modifications aiming towards targeted core components such as weight, hypertension, lipid management, diabetes, heart failure and other comorbidities.    Expected Outcomes  Short Term Goal: Understand basic principles of dietary content, such as calories, fat, sodium, cholesterol and nutrients.;Long Term Goal: Adherence to prescribed nutrition plan.       Nutrition Assessments: Nutrition Assessments - 08/18/17 1150      Rate Your Plate Scores   Pre Score  46       Nutrition Goals Re-Evaluation:   Nutrition Goals Discharge (Final Nutrition Goals  Re-Evaluation):   Psychosocial: Target Goals: Acknowledge presence or absence of significant depression and/or stress, maximize coping skills, provide positive support system. Participant is able to verbalize types and ability to use techniques and skills needed for reducing stress and depression.  Initial Review & Psychosocial Screening: Initial Psych Review & Screening - 08/13/17 1044      Initial Review   Current issues with  Current Stress Concerns;History of Depression    Source of Stress Concerns  Unable to participate in former interests or hobbies;Unable to perform yard/household activities    Comments  intra family strain      [employer]   Good Support System?  Yes      Barriers   Psychosocial barriers to participate in program  The patient should benefit from training in stress management and relaxation.      Screening Interventions   Interventions  Encouraged to exercise    Expected Outcomes  Short Term goal: Identification and review with participant of any Quality of Life or Depression concerns found by scoring the questionnaire.;Long Term goal: The participant improves quality of Life and PHQ9 Scores as seen by post scores and/or verbalization of changes       Quality of Life Scores:  Scores of 19 and below usually indicate a poorer quality of life in these areas.  A difference of  2-3 points is a clinically meaningful difference.  A difference of 2-3 points in the total score of the Quality of Life Index has been associated with significant improvement in overall quality of life, self-image, physical symptoms, and general health in studies assessing change in quality of life.   PHQ-9:  Recent Review Flowsheet Data    Depression screen [facility] 2/9 08/13/2017   Decreased Interest 1   Down, Depressed, Hopeless 1   PHQ - 2 Score 2   Altered sleeping 1   Tired, decreased energy 1   Change in appetite 2   Feeling bad or failure about yourself  1   Trouble  concentrating 1   Moving slowly or fidgety/restless 1   Suicidal thoughts 0   PHQ-9 Score 9   Difficult doing work/chores Somewhat difficult     Interpretation of Total Score  Total Score Depression Severity:  1-4 = Minimal depression, 5-9 = Mild depression, 10-14 = Moderate depression, 15-19 = Moderately severe depression, 20-27 = Severe depression   Psychosocial Evaluation and Intervention: Psychosocial Evaluation - 08/20/17 1530      Psychosocial Evaluation & Interventions   Interventions  Stress management education;Relaxation education;Encouraged to exercise with the program and follow exercise prescription    Comments  Pt with prior intra family strain.  Both pt and husband are introverts and have little interaction with each other in the home    Continue Psychosocial Services   Follow up required by staff       Psychosocial Re-Evaluation:   Psychosocial Discharge (Final Psychosocial Re-Evaluation):   Education: Education Goals: Education classes will be provided on a weekly basis, covering required topics. Participant will state understanding/return demonstration of topics presented.  Learning Barriers/Preferences: Learning Barriers/Preferences - 08/13/17 1050      Learning Barriers/Preferences   Learning Barriers  Sight;Hearing    Learning Preferences  Computer/Internet;Verbal Instruction;Individual Instruction;Group Instruction       Education Topics: Risk Factor Reduction:  -Group instruction that is supported by a PowerPoint presentation. Instructor discusses the definition of a risk factor, different risk factors for pulmonary disease, and how the heart and lungs work together.     Nutrition for Pulmonary Patient:  -Group instruction provided by PowerPoint slides, verbal discussion, and written materials to support subject matter. The instructor gives an explanation and review of healthy diet recommendations, which includes a discussion on weight management,  recommendations for fruit and vegetable consumption, as well as protein, fluid, caffeine, fiber, sodium, sugar, and alcohol. Tips for eating when patients are short of breath are discussed.   Pursed Lip Breathing:  -Group instruction that is supported by demonstration and informational handouts. Instructor discusses the benefits of pursed lip and diaphragmatic breathing and detailed demonstration on how to preform both.     Oxygen Safety:  -Group instruction provided by PowerPoint, verbal discussion, and written material to support subject matter. There is an overview of "What is Oxygen" and "Why do we need it".  Instructor also reviews how to create a safe environment for oxygen use, the importance of using oxygen as prescribed, and the risks of noncompliance. There is a brief discussion on traveling with oxygen and resources the patient may utilize.   Oxygen Equipment:  -Group instruction provided by [facility] Staff utilizing handouts, written materials, and equipment demonstrations.   Signs and Symptoms:  -Group instruction provided by written material and verbal discussion to support subject matter. Warning signs and symptoms of infection, stroke, and heart attack are reviewed and when to call the physician/911 reinforced. Tips for preventing the spread of infection discussed.   Advanced Directives:  -Group instruction provided by verbal instruction and written material to support subject matter. Instructor reviews Advanced Directive laws and proper instruction for filling out document.   Pulmonary Video:  -Group video education that reviews the  importance of medication and oxygen compliance, exercise, good nutrition, pulmonary hygiene, and pursed lip and diaphragmatic breathing for the pulmonary patient.   Exercise for the Pulmonary Patient:  -Group instruction that is supported by a PowerPoint presentation. Instructor discusses benefits of exercise, core components of exercise,  frequency, duration, and intensity of an exercise routine, importance of utilizing pulse oximetry during exercise, safety while exercising, and options of places to exercise outside of rehab.     Pulmonary Medications:  -Verbally interactive group education provided by instructor with focus on inhaled medications and proper administration.   Anatomy and Physiology of the Respiratory System and Intimacy:  -Group instruction provided by PowerPoint, verbal discussion, and written material to support subject matter. Instructor reviews respiratory cycle and anatomical components of the respiratory system and their functions. Instructor also reviews differences in obstructive and restrictive respiratory diseases with examples of each. Intimacy, Sex, and Sexuality differences are reviewed with a discussion on how relationships can change when diagnosed with pulmonary disease. Common sexual concerns are reviewed.   MD DAY -A group question and answer session with a medical doctor that allows participants to ask questions that relate to their pulmonary disease state.   OTHER EDUCATION -Group or individual verbal, written, or video instructions that support the educational goals of the pulmonary rehab program.   Holiday Eating Survival Tips:  -Group instruction provided by PowerPoint slides, verbal discussion, and written materials to support subject matter. The instructor gives patients tips, tricks, and techniques to help them not only survive but enjoy the holidays despite the onslaught of food that accompanies the holidays.   Knowledge Questionnaire Score:   Core Components/Risk Factors/Patient Goals at Admission: Personal Goals and Risk Factors at Admission - 08/13/17 1023      Core Components/Risk Factors/Patient Goals on Admission    Weight Management  Yes;Weight Gain    Intervention  Weight Management: Develop a combined nutrition and exercise program designed to reach desired caloric  intake, while maintaining appropriate intake of nutrient and fiber, sodium and fats, and appropriate energy expenditure required for the weight goal.;Weight Management: Provide education and appropriate resources to help participant work on and attain dietary goals.;Weight Management/Obesity: Establish reasonable short term and long term weight goals.;Obesity: Provide education and appropriate resources to help participant work on and attain dietary goals.    Admit Weight  114 lb 3.2 oz (51.8 kg)    Goal Weight: Short Term  118 lb (53.5 kg)    Goal Weight: Long Term  120 lb (54.4 kg)    Expected Outcomes  Short Term: Continue to assess and modify interventions until short term weight is achieved;Understanding of distribution of calorie intake throughout the day with the consumption of 4-5 meals/snacks;Weight Gain: Understanding of general recommendations for a high calorie, high protein meal plan that promotes weight gain by distributing calorie intake throughout the day with the consumption for 4-5 meals, snacks, and/or supplements;Long Term: Adherence to nutrition and physical activity/exercise program aimed toward attainment of established weight goal    Improve shortness of breath with ADL's  Yes    Intervention  Provide education, individualized exercise plan and daily activity instruction to help decrease symptoms of SOB with activities of daily living.    Expected Outcomes  Short Term: Improve cardiorespiratory fitness to achieve a reduction of symptoms when performing ADLs;Long Term: Be able to perform more ADLs without symptoms or delay the onset of symptoms    Diabetes  Yes    Intervention  Provide education about signs/symptoms  and action to take for hypo/hyperglycemia.;Provide education about proper nutrition, including hydration, and aerobic/resistive exercise prescription along with prescribed medications to achieve blood glucose in normal ranges: Fasting glucose 65-99 mg/dL    Expected  Outcomes  Short Term: Participant verbalizes understanding of the signs/symptoms and immediate care of hyper/hypoglycemia, proper foot care and importance of medication, aerobic/resistive exercise and nutrition plan for blood glucose control.;Long Term: Attainment of HbA1C < 7%.    Lipids  Yes    Intervention  Provide education and support for participant on nutrition & aerobic/resistive exercise along with prescribed medications to achieve LDL 70mg , HDL >40mg .    Expected Outcomes  Short Term: Participant states understanding of desired cholesterol values and is compliant with medications prescribed. Participant is following exercise prescription and nutrition guidelines.;Long Term: Cholesterol controlled with medications as prescribed, with individualized exercise RX and with personalized nutrition plan. Value goals: LDL < 70mg , HDL > 40 mg.    Stress  Yes    Intervention  Offer individual and/or small group education and counseling on adjustment to heart disease, stress management and health-related lifestyle change. Teach and support self-help strategies.    Expected Outcomes  Short Term: Participant demonstrates changes in health-related behavior, relaxation and other stress management skills, ability to obtain effective social support, and compliance with psychotropic medications if prescribed.;Long Term: Emotional wellbeing is indicated by absence of clinically significant psychosocial distress or social isolation.       Core Components/Risk Factors/Patient Goals Review:  Goals and Risk Factor Review    Row Name 08/20/17 1528             Core Components/Risk Factors/Patient Goals Review   Personal Goals Review  Weight Management/Obesity;Stress;Improve shortness of breath with ADL's;Diabetes;Lipids       Review  Pt will begin pulmonary rehab on 08/21/17.  Unable to assess her goals at this time.  I do anticipate that pt will be able to demonstrate progress toward achieving the expected  outcomes in the next 30 days.          Core Components/Risk Factors/Patient Goals at Discharge (Final Review):  Goals and Risk Factor Review - 08/20/17 1528      Core Components/Risk Factors/Patient Goals Review   Personal Goals Review  Weight Management/Obesity;Stress;Improve shortness of breath with ADL's;Diabetes;Lipids    Review  Pt will begin pulmonary rehab on 08/21/17.  Unable to assess her goals at this time.  I do anticipate that pt will be able to demonstrate progress toward achieving the expected outcomes in the next 30 days.       ITP Comments: ITP Comments    Row Name 08/13/17 1013 08/20/17 1525         ITP Comments  Dr. Jennet Maduro, Medical Director  Dr. Jennet Maduro, Medical Director         Comments: Pt has completed 1 exercise session. Cherre Huger, BSN Cardiac and Training and development officer

## 2017-08-21 ENCOUNTER — Encounter (HOSPITAL_COMMUNITY)
Admission: RE | Admit: 2017-08-21 | Discharge: 2017-08-21 | Disposition: A | Discharge status: Home / Self Care | Payer: Medicare Other | Source: Ambulatory Visit | Attending: Pulmonary Disease | Admitting: Pulmonary Disease

## 2017-08-21 DIAGNOSIS — J9611 Chronic respiratory failure with hypoxia: Secondary | ICD-10-CM | POA: Insufficient documentation

## 2017-08-21 NOTE — Progress Notes (Signed)
Daily Session Note  Patient Details  Name: Heidi Moran MRN: [id number] Date of Birth: [birth year] Referring Provider:     Pulmonary Rehab Walk Test from 08/14/2017 in [place]  Referring Provider  Dr. Vaughan Browner      Encounter Date: 08/21/2017  Check In: Session Check In - 08/21/17 1128      Check-In   Location  [facility]    Staff Present  Rodney Langton, RN;Carlette Wilber Oliphant, RN, BSN;Molly DiVincenzo, MS, ACSM RCEP, Exercise Physiologist;Annedrea Rosezella [place], RN, [facility]    Supervising physician immediately available to respond to emergencies  [facility] immediately available    Physician(s)  Dr. Tana Coast    Medication changes reported      No    Fall or balance concerns reported     No    Tobacco Cessation  No Change    Warm-up and Cool-down  Performed as group-led instruction    Resistance Training Performed  Yes    VAD Patient?  No      Pain Assessment   Currently in Pain?  No/denies    Multiple Pain Sites  No       Capillary Blood Glucose: No results found for this or any previous visit (from the past 24 hour(s)).    Social History   Tobacco Use  Smoking Status Never Smoker  Smokeless Tobacco Never Used    Goals Met:  Exercise tolerated well No report of cardiac concerns or symptoms Strength training completed today  Goals Unmet:  Not Applicable  Comments: Service time is from 1030 to 1230    Dr. Rush Farmer is Medical Director for Pulmonary Rehab at [facility].

## 2017-08-26 ENCOUNTER — Encounter (HOSPITAL_COMMUNITY)
Admission: RE | Admit: 2017-08-26 | Discharge: 2017-08-26 | Disposition: A | Discharge status: Home / Self Care | Payer: Medicare Other | Source: Ambulatory Visit | Attending: Pulmonary Disease | Admitting: Pulmonary Disease

## 2017-08-26 DIAGNOSIS — J9611 Chronic respiratory failure with hypoxia: Secondary | ICD-10-CM

## 2017-08-26 NOTE — Progress Notes (Signed)
Incomplete Session Note  Patient Details  Name: Heidi Moran MRN: [id number] Date of Birth: [birth year] Referring Provider:     Pulmonary Rehab Walk Test from 08/14/2017 in [place]  Referring Provider  [name] did not complete her rehab session.  Her pre-exercise CBG was 327 and was not allowed to exercise d/t department guidelines.  She ate a high sugar content breakfast and was instructed to eat a high protein snack when she arrived home.

## 2017-08-28 ENCOUNTER — Encounter (HOSPITAL_COMMUNITY)
Admission: RE | Admit: 2017-08-28 | Discharge: 2017-08-28 | Disposition: A | Discharge status: Home / Self Care | Payer: Medicare Other | Source: Ambulatory Visit | Attending: Pulmonary Disease | Admitting: Pulmonary Disease

## 2017-08-28 DIAGNOSIS — J9611 Chronic respiratory failure with hypoxia: Secondary | ICD-10-CM

## 2017-08-28 LAB — GLUCOSE, CAPILLARY: Glucose-Capillary: 120 mg/dL — ABNORMAL HIGH (ref 65–99)

## 2017-08-28 NOTE — Progress Notes (Signed)
Daily Session Note  Patient Details  Name: Heidi Moran MRN: [id number] Date of Birth: 11/14/1935 Referring Provider:     Pulmonary Rehab Walk Test from 08/14/2017 in [place]  Referring Provider  Dr. Vaughan Browner      Encounter Date: 08/28/2017  Check In: Session Check In - 08/28/17 1030      Check-In   Location  [facility]    Staff Present  Rosebud Poles, RN, BSN;Molly DiVincenzo, MS, ACSM RCEP, Exercise Physiologist;Lisa Ysidro Evert, RN;Carlette Wilber Oliphant, RN, BSN;Ramon Dredge, RN, [facility]    Supervising physician immediately available to respond to emergencies  [facility] immediately available    Physician(s)  Dr. Broadus John    Medication changes reported      No    Fall or balance concerns reported     No    Tobacco Cessation  No Change    Warm-up and Cool-down  Performed as group-led instruction    Resistance Training Performed  Yes    VAD Patient?  No      Pain Assessment   Currently in Pain?  No/denies    Multiple Pain Sites  No       Capillary Blood Glucose: Results for orders placed or performed during the hospital encounter of 08/28/17 (from the past 24 hour(s))  Glucose, capillary     Status: Abnormal   Collection Time: 08/28/17 12:38 PM  Result Value Ref Range   Glucose-Capillary 120 (H) 65 - 99 mg/dL      Social History   Tobacco Use  Smoking Status Never Smoker  Smokeless Tobacco Never Used    Goals Met:  Exercise tolerated well Strength training completed today  Goals Unmet:  Not Applicable  Comments: Service time is from 1030 to 1215    Dr. Rush Farmer is Medical Director for Pulmonary Rehab at [facility].

## 2017-09-02 ENCOUNTER — Encounter (HOSPITAL_COMMUNITY)
Admission: RE | Admit: 2017-09-02 | Discharge: 2017-09-02 | Disposition: A | Discharge status: Home / Self Care | Payer: Medicare Other | Source: Ambulatory Visit | Attending: Pulmonary Disease | Admitting: Pulmonary Disease

## 2017-09-02 ENCOUNTER — Ambulatory Visit: Payer: Medicare Other | Admitting: Pulmonary Disease

## 2017-09-02 VITALS — Wt 113.1 lb

## 2017-09-02 DIAGNOSIS — J9611 Chronic respiratory failure with hypoxia: Secondary | ICD-10-CM

## 2017-09-02 LAB — GLUCOSE, CAPILLARY
GLUCOSE-CAPILLARY: 213 mg/dL — AB (ref 65–99)
Glucose-Capillary: 113 mg/dL — ABNORMAL HIGH (ref 65–99)

## 2017-09-02 NOTE — Progress Notes (Signed)
Daily Session Note  Patient Details  Name: Heidi Moran MRN: [id number] Date of Birth: 03/02/1936 Referring Provider:     Pulmonary Rehab Walk Test from 08/14/2017 in [place]  Referring Provider  Dr. Vaughan Browner      Encounter Date: 09/02/2017  Check In: Session Check In - 09/02/17 1030      Check-In   Location  [facility]    Staff Present  Rosebud Poles, RN, BSN;Molly DiVincenzo, MS, ACSM RCEP, Exercise Physiologist;Lisa Ysidro Evert, Felipe Drone, RN, MHA;Carlette Wilber Oliphant, RN, BSN    Supervising physician immediately available to respond to emergencies  [facility] immediately available    Physician(s)  Dr. Broadus John    Medication changes reported      No    Fall or balance concerns reported     No    Tobacco Cessation  No Change    Warm-up and Cool-down  Performed as group-led instruction    Resistance Training Performed  Yes    VAD Patient?  No      Pain Assessment   Currently in Pain?  No/denies    Multiple Pain Sites  No       Capillary Blood Glucose: Results for orders placed or performed during the hospital encounter of 09/02/17 (from the past 24 hour(s))  Glucose, capillary     Status: Abnormal   Collection Time: 09/02/17 10:30 AM  Result Value Ref Range   Glucose-Capillary 113 (H) 65 - 99 mg/dL  Glucose, capillary     Status: Abnormal   Collection Time: 09/02/17 12:13 PM  Result Value Ref Range   Glucose-Capillary 213 (H) 65 - 99 mg/dL      Social History   Tobacco Use  Smoking Status Never Smoker  Smokeless Tobacco Never Used    Goals Met:  Exercise tolerated well Strength training completed today  Goals Unmet:  Not Applicable  Comments: Service time is from 1030 to [place]    Dr. Rush Farmer is Medical Director for Pulmonary Rehab at [facility].

## 2017-09-04 ENCOUNTER — Encounter (HOSPITAL_COMMUNITY)
Admission: RE | Admit: 2017-09-04 | Discharge: 2017-09-04 | Disposition: A | Discharge status: Home / Self Care | Payer: Medicare Other | Source: Ambulatory Visit | Attending: Pulmonary Disease | Admitting: Pulmonary Disease

## 2017-09-04 VITALS — Wt 112.9 lb

## 2017-09-04 DIAGNOSIS — J9611 Chronic respiratory failure with hypoxia: Secondary | ICD-10-CM | POA: Diagnosis not present

## 2017-09-04 NOTE — Progress Notes (Signed)
Heidi Moran 82 y.o. female   DOB: May 06, 1935 MRN: [id number]          Nutrition 1. Chronic respiratory failure with hypoxia [facility])    Past Medical History:  Diagnosis Date  . Anemia   . Diabetes mellitus without complication ([place])   . Eczema   . Hyperlipidemia   . Hypertension   . Osteoarthritis   . Renal disorder    Meds reviewed. Glucovance, lisinopril, lipitor noted  Ht: Ht Readings from Last 1 Encounters:  08/13/17 4' 11.25" (1.505 m)     Wt:  Wt Readings from Last 3 Encounters:  09/04/17 112 lb 14 oz (51.2 kg)  09/02/17 113 lb 1.5 oz (51.3 kg)  08/13/17 114 lb 3.2 oz (51.8 kg)     BMI:  22.61 kg/m2    Current tobacco use? No   Labs:  Lipid Panel  No results found for: CHOL, TRIG, HDL, CHOLHDL, VLDL, LDLCALC, LDLDIRECT  No results found for: HGBA1C Note Spoke with pt. Pt is within normal weight.  Pt eats 3 meals a day; mostly convenience foods as she tires quickly.  Pt's Rate Your Plate results reviewed with pt. Pt does not avoid salty food; uses canned/ convenience food. Due to pt's reliance on convenience foods and the fact that she tires easily, lacks an appetite and finds finishing meals difficult, discussed using supplemental nutrition shake between meals.  The role of sodium in lung disease reviewed with pt. Pt expressed understanding of the information reviewed.   Pt is diabetic.  Pt checks CBG's 2-3 times a day.  CBG's reportedly  85-130 mg/dL before meals.  Pre-exercise CBG's have been 110-295 mg/dL and post-exercise CBG's have been  120-130 mg/dL.  Pt expressed understanding of the information reviewed via feedback method.    Nutrition Diagnosis ? Food-and nutrition-related knowledge deficit related to lack of exposure to information as related to diagnosis of pulmonary disease Nutrition Intervention ? Pt's individual nutrition plan and goals reviewed with pt. ? Benefits of adopting healthy eating habits discussed when pt's Rate Your Plate  reviewed.  Goal(s)  1. The pt will recognize symptoms that can interfere with adequate oral intake, such as shortness of breath, N/V, early satiety, fatigue, ability to secure and prepare food, taste and smell changes, chewing/swallowing difficulties, and/ or pain when eating. 2. The pt will consume high-energy, high-nutrient dense beverages when necessary to compensate for decreased oral intake of solid foods. 3. The pt will have family and friends shop for food when necessary so that nourishing foods are always available at home. 4.  Plan:  Pt to attend Pulmonary Nutrition class Will provide client-centered nutrition education as part of interdisciplinary care.   Monitor and evaluate progress toward nutrition goal with team.  Monitor and Evaluate progress toward nutrition goal with team.   Laurina Bustle, MS, RD, LDN 09/04/2017 1:25 PM

## 2017-09-04 NOTE — Progress Notes (Signed)
Daily Session Note  Patient Details  Name: Heidi Moran MRN: [id number] Date of Birth: [birth year] Referring Provider:     Pulmonary Rehab Walk Test from 08/14/2017 in [place]  Referring Provider  Dr. Vaughan Browner      Encounter Date: 09/04/2017  Check In: Session Check In - 09/04/17 1030      Check-In   Location  [facility]    Staff Present  Rosebud Poles, RN, BSN;Molly DiVincenzo, MS, ACSM RCEP, Exercise Physiologist;Lisa Ysidro Evert, Felipe Drone, RN, [facility]    Supervising physician immediately available to respond to emergencies  [facility] immediately available    Physician(s)  Dr. Herbert Moors    Medication changes reported      No    Fall or balance concerns reported     No    Tobacco Cessation  No Change    Warm-up and Cool-down  Performed as group-led instruction    Resistance Training Performed  Yes    VAD Patient?  No      Pain Assessment   Currently in Pain?  No/denies    Multiple Pain Sites  No       Capillary Blood Glucose: No results found for this or any previous visit (from the past 24 hour(s)).    Social History   Tobacco Use  Smoking Status Never Smoker  Smokeless Tobacco Never Used    Goals Met:  Exercise tolerated well Strength training completed today  Goals Unmet:  Not Applicable  Comments: Service time is from 1030 to 1220    Dr. Rush Farmer is Medical Director for Pulmonary Rehab at [facility].

## 2017-09-09 ENCOUNTER — Encounter (HOSPITAL_COMMUNITY)
Admission: RE | Admit: 2017-09-09 | Discharge: 2017-09-09 | Disposition: A | Discharge status: Home / Self Care | Payer: Medicare Other | Source: Ambulatory Visit | Attending: Pulmonary Disease | Admitting: Pulmonary Disease

## 2017-09-09 VITALS — Wt 114.4 lb

## 2017-09-09 DIAGNOSIS — J9611 Chronic respiratory failure with hypoxia: Secondary | ICD-10-CM

## 2017-09-09 NOTE — Progress Notes (Signed)
Daily Session Note  Patient Details  Name: Heidi Moran MRN: [id number] Date of Birth: 03-28-1935 Referring Provider:     Pulmonary Rehab Walk Test from 08/14/2017 in [place]  Referring Provider  Dr. Vaughan Browner      Encounter Date: 09/09/2017  Check In: Session Check In - 09/09/17 1030      Check-In   Location  [facility]    Staff Present  Rosebud Poles, RN, BSN;Molly DiVincenzo, MS, ACSM RCEP, Exercise Physiologist;Lisa Ysidro Evert, Felipe Drone, RN, [facility]    Supervising physician immediately available to respond to emergencies  [facility] immediately available    Physician(s)  Dr. Herbert Moors    Medication changes reported      No    Fall or balance concerns reported     No    Tobacco Cessation  No Change    Warm-up and Cool-down  Performed as group-led instruction    Resistance Training Performed  Yes    VAD Patient?  No    PAD/SET Patient?  No      Pain Assessment   Currently in Pain?  No/denies    Multiple Pain Sites  No       Capillary Blood Glucose: No results found for this or any previous visit (from the past 24 hour(s)).  Exercise Prescription Changes - 09/09/17 1200      Response to Exercise   Blood Pressure (Admit)  122/59    Blood Pressure (Exercise)  126/58    Blood Pressure (Exit)  110/65    Heart Rate (Admit)  84 bpm    Heart Rate (Exercise)  82 bpm    Heart Rate (Exit)  78 bpm    Oxygen Saturation (Admit)  97 %    Oxygen Saturation (Exercise)  97 %    Oxygen Saturation (Exit)  78 %    Rating of Perceived Exertion (Exercise)  13    Perceived Dyspnea (Exercise)  1    Duration  Progress to 45 minutes of aerobic exercise without signs/symptoms of physical distress    Intensity  -- 40-80% HRR      Progression   Progression  Continue to progress workloads to maintain intensity without signs/symptoms of physical distress.      Resistance Training   Training Prescription  Yes    Weight  orange  bands    Reps  10-15    Time  10 Minutes      Interval Training   Interval Training  No      Oxygen   Oxygen  Continuous    Liters  2      Recumbant Bike   Level  1    Watts  10    Minutes  17      NuStep   Level  2    SPM  80    Minutes  17    METs  1.6      Track   Laps  9    Minutes  17       Social History   Tobacco Use  Smoking Status Never Smoker  Smokeless Tobacco Never Used    Goals Met:  Exercise tolerated well Strength training completed today  Goals Unmet:  Not Applicable  Comments: Service time is from 1030 to 1220   Dr. Rush Farmer is Medical Director for Pulmonary Rehab at [facility].

## 2017-09-11 ENCOUNTER — Encounter: Payer: Self-pay | Admitting: Pulmonary Disease

## 2017-09-11 ENCOUNTER — Encounter (HOSPITAL_COMMUNITY)
Admission: RE | Admit: 2017-09-11 | Discharge: 2017-09-11 | Disposition: A | Discharge status: Home / Self Care | Payer: Medicare Other | Source: Ambulatory Visit | Attending: Pulmonary Disease | Admitting: Pulmonary Disease

## 2017-09-11 ENCOUNTER — Ambulatory Visit (INDEPENDENT_AMBULATORY_CARE_PROVIDER_SITE_OTHER): Payer: Medicare Other | Admitting: Pulmonary Disease

## 2017-09-11 VITALS — BP 122/60 | HR 95 | Ht 60.0 in | Wt 113.0 lb

## 2017-09-11 DIAGNOSIS — J9611 Chronic respiratory failure with hypoxia: Secondary | ICD-10-CM

## 2017-09-11 DIAGNOSIS — R911 Solitary pulmonary nodule: Secondary | ICD-10-CM

## 2017-09-11 DIAGNOSIS — R918 Other nonspecific abnormal finding of lung field: Secondary | ICD-10-CM

## 2017-09-11 NOTE — Progress Notes (Signed)
Daily Session Note  Patient Details  Name: DEBBERA WOLKEN MRN: [id number] Date of Birth: 1936/02/06 Referring Provider:     Pulmonary Rehab Walk Test from 08/14/2017 in [place]  Referring Provider  Dr. Vaughan Browner      Encounter Date: 09/11/2017  Check In: Session Check In - 09/11/17 1115      Check-In   Location  [facility]    Staff Present  Rosebud Poles, RN, BSN;Molly DiVincenzo, MS, ACSM RCEP, Exercise Physiologist;Lisa Ysidro Evert, Felipe Drone, RN, [facility]    Supervising physician immediately available to respond to emergencies  [facility] immediately available    Physician(s)  Dr. Bonner Puna    Medication changes reported      No    Fall or balance concerns reported     No    Tobacco Cessation  No Change    Warm-up and Cool-down  Performed as group-led instruction    Resistance Training Performed  Yes    VAD Patient?  No    PAD/SET Patient?  No      Pain Assessment   Currently in Pain?  No/denies    Multiple Pain Sites  No       Capillary Blood Glucose: No results found for this or any previous visit (from the past 24 hour(s)).    Social History   Tobacco Use  Smoking Status Never Smoker  Smokeless Tobacco Never Used    Goals Met:  Exercise tolerated well Strength training completed today  Goals Unmet:  Not Applicable  Comments: Service time is from 1115 to 1330    Dr. Rush Farmer is Medical Director for Pulmonary Rehab at [facility].

## 2017-09-11 NOTE — Patient Instructions (Signed)
I am glad you are doing well with your breathing Continue the rehab program We will order a follow-up CT without contrast in 3 months time Follow-up in clinic with pulmonary function test in 3 months after CT scan.

## 2017-09-11 NOTE — Progress Notes (Addendum)
Heidi Moran    [id number]    03/25/1935  Primary Care Physician:Aronson, Delfino Lovett, MD  Referring Physician: Burnard Bunting, MD [address] [place], [place] [address]  Chief complaint:  Follow up for  Rheumatoid lung nodules Hypoxic respiratory failure on oxygen.  HPI: 82 year old with history of rheumatoid arthritis, diabetes, hyperlipidemia, hypertension.  She was evaluated in the ED recently fatigue, cough with a chest x-ray that showed lung nodules.  She had a follow-up CT scan and PET scan which demonstrated multiple pulmonary nodules, mild uptake in the dominant nodule.  [Reports below]. She has history of rheumatoid arthritis diagnosed about 2 years ago.  She follows with Dr. Trudie Reed, previously treated with Golinumab infusions.  Hospitalized in March 2019 for acute respiratory failure with bronchospasm and hypoxia, upper respiratory tract infection. Flu test was negative, CT chest showed no pulmonary embolism or acute lung abnormality Treated with steroids with improvement in symptoms and discharged on supplemental oxygen to SNF.  Pets: Cats.  No birds, farm animals Occupation: Housewife Exposures: None exposures Smoking history: Never smoker Travel History:Grew up in [place].  Moved to [place] 35 years ago.  Interim history: States that breathing is slightly better.  Continues on supplemental oxygen Started on pulmonary rehab when she is tolerating it well.  Received note from [facility] rheumatology dated 06/11/17 Followed up for rheumatoid arthritis, of [place] since December 2018.  Holding further treatment of rheumatoid arthritis as symptoms continue to be stable.  Outpatient Encounter Medications as of 09/11/2017  Medication Sig  . atorvastatin (LIPITOR) 20 MG tablet Take 20 mg by mouth daily.  . Calcium-Vitamin D-Vitamin K (VIACTIV PO) Take 1 tablet by mouth daily.  Marland Kitchen glyBURIDE-metformin (GLUCOVANCE) 2.5-500 MG tablet Take 1 tablet by mouth  daily with breakfast. Pt takes one in the morning with breakfast and one in the evening  . lisinopril (PRINIVIL,ZESTRIL) 10 MG tablet Take 10 mg by mouth daily.  Marland Kitchen OVER THE COUNTER MEDICATION   . ipratropium-albuterol (DUONEB) 0.5-2.5 (3) MG/3ML SOLN Take 3 mLs by nebulization every 4 (four) hours as needed. (Patient not taking: Reported on 06/24/2017)  . [DISCONTINUED] Golimumab (Platea ARIA IV) Inject into the vein.  . [DISCONTINUED] predniSONE (DELTASONE) 10 MG tablet 40 mg daily x 3 days then 20 mg daily x 3 days then 10 mg daily x 3 days (Patient not taking: Reported on 06/24/2017)   No facility-administered encounter medications on file as of 09/11/2017.     Allergies as of 09/11/2017  . (No Known Allergies)    Past Medical History:  Diagnosis Date  . Anemia   . Diabetes mellitus without complication ([place])   . Eczema   . Hyperlipidemia   . Hypertension   . Osteoarthritis   . Renal disorder     No past surgical history on file.  No family history on file.  Social History   Socioeconomic History  . Marital status: Married    Spouse name: Not on file  . Number of children: Not on file  . Years of education: Not on file  . Highest education level: Not on file  Occupational History  . Not on file  Social Needs  . Financial resource strain: Not on file  . Food insecurity:    Worry: Not on file    Inability: Not on file  . Transportation needs:    Medical: Not on file    Non-medical: Not on file  Tobacco Use  . Smoking status: Never Smoker  .  Smokeless tobacco: Never Used  Substance and Sexual Activity  . Alcohol use: No  . Drug use: No  . Sexual activity: Not on file  Lifestyle  . Physical activity:    Days per week: Not on file    Minutes per session: Not on file  . Stress: Not on file  Relationships  . Social connections:    Talks on phone: Not on file    Gets together: Not on file    Attends religious service: Not on file    Active member of club or  organization: Not on file    Attends meetings of clubs or organizations: Not on file    Relationship status: Not on file  . Intimate partner violence:    Fear of current or ex partner: Not on file    Emotionally abused: Not on file    Physically abused: Not on file    Forced sexual activity: Not on file  Other Topics Concern  . Not on file  Social History Narrative  . Not on file    Review of systems: Review of Systems  Constitutional: Negative for fever and chills.  HENT: Negative.   Eyes: Negative for blurred vision.  Respiratory: as per HPI  Cardiovascular: Negative for chest pain and palpitations.  Gastrointestinal: Negative for vomiting, diarrhea, blood per rectum. Genitourinary: Negative for dysuria, urgency, frequency and hematuria.  Musculoskeletal: Negative for myalgias, back pain and joint pain.  Skin: Negative for itching and rash.  Neurological: Negative for dizziness, tremors, focal weakness, seizures and loss of consciousness.  Endo/Heme/Allergies: Negative for environmental allergies.  Psychiatric/Behavioral: Negative for depression, suicidal ideas and hallucinations.  All other systems reviewed and are negative.  Physical Exam: Blood pressure 122/60, pulse 95, height 5' (1.524 m), weight 113 lb (51.3 kg), SpO2 99 %. Gen:      No acute distress HEENT:  EOMI, sclera anicteric Neck:     No masses; no thyromegaly Lungs:    Clear to auscultation bilaterally; normal respiratory effort CV:         Regular rate and rhythm; no murmurs Abd:      + bowel sounds; soft, non-tender; no palpable masses, no distension Ext:    No edema; adequate peripheral perfusion Skin:      Warm and dry; no rash Neuro: alert and oriented x 3 Psych: normal mood and affect  Data Reviewed: Chest x-ray 01/28/17-multiple nodular densities. CT chest 02/12/17-borderline precarinal lymph nodes, multiple pulmonary nodules bilaterally.  The largest one in the right upper lobe measures 13 x11  mm. PET scan 02/27/17-dominant nodule in the right middle lobe has mild-moderate FDG uptake.  No other areas of uptake. CTA 05/17/17-no pulmonary embolism, stable lung nodules. CT scan 06/11/17-stability of pulmonary nodules the largest is 1.3 cm in the right middle lobe. I have reviewed the images personally  Received reports (no images) from primary care office Chest x-ray 04/11/16 Stable right lung nodules likely granuloma.  No change compared to 03/31/15  FENO 06/24/17-36 CBC 06/24/2017-WBC 6.2, eos 1.5%, absolute eosinophil count 100 Blood allergy profile 06/24/2017-IgE to 32, sensitive to cat, Aspergillus, mouse urine  Assessment:  Recent admission for respiratory failure Unclear etiology for her recent admission.  She could have acute bronchospasm with underlying asthma given slightly elevated FENO. PFTs ordered but not done yet. Continue supplemental oxygen.  Started on pulmonary rehab with slow improvement.  Follow up for multiple pulmonary nodules Her images reviewed which shows stable rounded nodular opacities with mild PET activity.  Per  report from primary care this has been present since at least 2017. This could be consistent with rheumatoid nodules which are are known to have low FDG uptake. The PET scan does not reveal any other abnormal areas of uptake.  She is a non-smoker with low risk of malignancy.    Continue CT scan follow-up.  Scheduled for September 2019  Plan/Recommendations: - CT scan without contrast in September 2019 - Pulmonary function test.  Marshell Garfinkel MD [facility] 09/11/2017, 4:21 PM  CC: Burnard Bunting, MD   Addendum: Received note from Dr. Trudie Reed, rheumatology dated 10/15/2017 Follow-up for rheumatoid arthritis with stable symptoms, no flareups Previously on Golimumab but currently not on therapy.  Marshell Garfinkel MD [facility] 10/29/2017, 11:41 AM

## 2017-09-16 ENCOUNTER — Encounter (HOSPITAL_COMMUNITY)
Admission: RE | Admit: 2017-09-16 | Discharge: 2017-09-16 | Disposition: A | Discharge status: Home / Self Care | Payer: Medicare Other | Source: Ambulatory Visit | Attending: Pulmonary Disease | Admitting: Pulmonary Disease

## 2017-09-16 VITALS — Wt 114.2 lb

## 2017-09-16 DIAGNOSIS — J9611 Chronic respiratory failure with hypoxia: Secondary | ICD-10-CM | POA: Insufficient documentation

## 2017-09-16 NOTE — Progress Notes (Signed)
Pulmonary Individual Treatment Plan  Patient Details  Name: Heidi Moran MRN: [id number] Date of Birth: Dec 10, 1935 Referring Provider:     Pulmonary Rehab Walk Test from 08/14/2017 in [place]  Referring Provider  Dr. Vaughan Browner      Initial Encounter Date:    Pulmonary Rehab Walk Test from 08/14/2017 in [place]  Date  08/15/17      Visit Diagnosis: Chronic respiratory failure with hypoxia ([place])  Patient's Home Medications on Admission:   Current Outpatient Medications:  .  atorvastatin (LIPITOR) 20 MG tablet, Take 20 mg by mouth daily., Disp: , Rfl:  .  Calcium-Vitamin D-Vitamin K (VIACTIV PO), Take 1 tablet by mouth daily., Disp: , Rfl:  .  glyBURIDE-metformin (GLUCOVANCE) 2.5-500 MG tablet, Take 1 tablet by mouth daily with breakfast. Pt takes one in the morning with breakfast and one in the evening, Disp: , Rfl:  .  ipratropium-albuterol (DUONEB) 0.5-2.5 (3) MG/3ML SOLN, Take 3 mLs by nebulization every 4 (four) hours as needed. (Patient not taking: Reported on 06/24/2017), Disp: 360 mL, Rfl:  .  lisinopril (PRINIVIL,ZESTRIL) 10 MG tablet, Take 10 mg by mouth daily., Disp: , Rfl:  .  OVER THE COUNTER MEDICATION, , Disp: , Rfl:   Past Medical History: Past Medical History:  Diagnosis Date  . Anemia   . Diabetes mellitus without complication ([place])   . Eczema   . Hyperlipidemia   . Hypertension   . Osteoarthritis   . Renal disorder     Tobacco Use: Social History   Tobacco Use  Smoking Status Never Smoker  Smokeless Tobacco Never Used    Labs: Recent Review Scientist, physiological    Labs for ITP Cardiac and Pulmonary Rehab Latest Ref Rng & Units 11/03/2015 01/28/2017   TCO2 22 - 32 mmol/L 26 26      Capillary Blood Glucose: Lab Results  Component Value Date   GLUCAP 213 (H) 09/02/2017   GLUCAP 113 (H) 09/02/2017   GLUCAP 120 (H) 08/28/2017   GLUCAP 295 (H) 05/21/2017   GLUCAP 115 (H) 05/21/2017    POCT Glucose    Row Name 08/26/17 1209             POCT Blood Glucose   Pre-Exercise  327 mg/dL          Pulmonary Assessment Scores: Pulmonary Assessment Scores    Row Name 08/15/17 0842 08/26/17 1025       ADL UCSD   ADL Phase  Entry  Entry    SOB Score total  -  37      CAT Score   CAT Score  -  15      mMRC Score   mMRC Score  1  -       Pulmonary Function Assessment:   Exercise Target Goals:    Exercise Program Goal: Individual exercise prescription set using results from initial 6 min walk test and THRR while considering  patient's activity barriers and safety.   Exercise Prescription Goal: Initial exercise prescription builds to 30-45 minutes a day of aerobic activity, 2-3 days per week.  Home exercise guidelines will be given to patient during program as part of exercise prescription that the participant will acknowledge.  Activity Barriers & Risk Stratification: Activity Barriers & Cardiac Risk Stratification - 08/13/17 1210      Activity Barriers & Cardiac Risk Stratification   Activity Barriers  Arthritis;Balance Concerns;History of Falls;Shortness of Breath;Deconditioning;Joint Problems  6 Minute Walk: 6 Minute Walk    Row Name 08/15/17 0839         6 Minute Walk   Phase  Initial     Distance  1000 feet     Walk Time  6 minutes     # of Rest Breaks  0     MPH  1.89     METS  2.45     RPE  13     Perceived Dyspnea   3     Symptoms  Yes (comment)     Comments  used wheelchair     Resting HR  86 bpm     Resting BP  118/58     Resting Oxygen Saturation   97 %     Exercise Oxygen Saturation  during 6 min walk  92 %     Max Ex. HR  107 bpm     Max Ex. BP  140/60       Interval HR   1 Minute HR  97     2 Minute HR  102     3 Minute HR  105     4 Minute HR  107     5 Minute HR  107     6 Minute HR  107     2 Minute Post HR  103     Interval Heart Rate?  Yes       Interval Oxygen   Interval Oxygen?  Yes     Baseline  Oxygen Saturation %  97 %     1 Minute Oxygen Saturation %  100 %     1 Minute Liters of Oxygen  2 L     2 Minute Oxygen Saturation %  99 %     2 Minute Liters of Oxygen  2 L     3 Minute Oxygen Saturation %  97 %     3 Minute Liters of Oxygen  2 L     4 Minute Oxygen Saturation %  93 %     4 Minute Liters of Oxygen  2 L     5 Minute Oxygen Saturation %  93 %     5 Minute Liters of Oxygen  2 L     6 Minute Oxygen Saturation %  92 %     6 Minute Liters of Oxygen  2 L     2 Minute Post Oxygen Saturation %  96 %     2 Minute Post Liters of Oxygen  2 L        Oxygen Initial Assessment: Oxygen Initial Assessment - 08/15/17 0839      Initial 6 min Walk   Oxygen Used  Continuous;E-Tanks    Liters per minute  2      Program Oxygen Prescription   Program Oxygen Prescription  Continuous;E-Tanks    Liters per minute  2       Oxygen Re-Evaluation: Oxygen Re-Evaluation    Row Name 08/20/17 1526 09/12/17 1013           Program Oxygen Prescription   Program Oxygen Prescription  Continuous;E-Tanks  Continuous;E-Tanks      Liters per minute  2  2        Home Oxygen   Home Oxygen Device  Portable Concentrator;Home Concentrator  Portable Concentrator;Home Concentrator      Sleep Oxygen Prescription  Continuous  Continuous      Liters per minute  2  2  Home Exercise Oxygen Prescription  Continuous  Continuous      Liters per minute  2  2      Home at Rest Exercise Oxygen Prescription  Pulsed  Pulsed      Compliance with Home Oxygen Use  Yes  Yes        Goals/Expected Outcomes   Short Term Goals  To learn and exhibit compliance with exercise, home and travel O2 prescription;To learn and understand importance of maintaining oxygen saturations>88%;To learn and demonstrate proper use of respiratory medications;To learn and understand importance of monitoring SPO2 with pulse oximeter and demonstrate accurate use of the pulse oximeter.;To learn and demonstrate proper pursed lip  breathing techniques or other breathing techniques.  To learn and exhibit compliance with exercise, home and travel O2 prescription;To learn and understand importance of maintaining oxygen saturations>88%;To learn and demonstrate proper use of respiratory medications;To learn and understand importance of monitoring SPO2 with pulse oximeter and demonstrate accurate use of the pulse oximeter.;To learn and demonstrate proper pursed lip breathing techniques or other breathing techniques.      Long  Term Goals  Exhibits compliance with exercise, home and travel O2 prescription;Verbalizes importance of monitoring SPO2 with pulse oximeter and return demonstration;Maintenance of O2 saturations>88%;Exhibits proper breathing techniques, such as pursed lip breathing or other method taught during program session;Compliance with respiratory medication  Exhibits compliance with exercise, home and travel O2 prescription;Verbalizes importance of monitoring SPO2 with pulse oximeter and return demonstration;Maintenance of O2 saturations>88%;Exhibits proper breathing techniques, such as pursed lip breathing or other method taught during program session;Compliance with respiratory medication      Goals/Expected Outcomes  -  Compliance and understanding         Oxygen Discharge (Final Oxygen Re-Evaluation): Oxygen Re-Evaluation - 09/12/17 1013      Program Oxygen Prescription   Program Oxygen Prescription  Continuous;E-Tanks    Liters per minute  2      Home Oxygen   Home Oxygen Device  Portable Concentrator;Home Concentrator    Sleep Oxygen Prescription  Continuous    Liters per minute  2    Home Exercise Oxygen Prescription  Continuous    Liters per minute  2    Home at Rest Exercise Oxygen Prescription  Pulsed    Compliance with Home Oxygen Use  Yes      Goals/Expected Outcomes   Short Term Goals  To learn and exhibit compliance with exercise, home and travel O2 prescription;To learn and understand  importance of maintaining oxygen saturations>88%;To learn and demonstrate proper use of respiratory medications;To learn and understand importance of monitoring SPO2 with pulse oximeter and demonstrate accurate use of the pulse oximeter.;To learn and demonstrate proper pursed lip breathing techniques or other breathing techniques.    Long  Term Goals  Exhibits compliance with exercise, home and travel O2 prescription;Verbalizes importance of monitoring SPO2 with pulse oximeter and return demonstration;Maintenance of O2 saturations>88%;Exhibits proper breathing techniques, such as pursed lip breathing or other method taught during program session;Compliance with respiratory medication    Goals/Expected Outcomes  Compliance and understanding       Initial Exercise Prescription: Initial Exercise Prescription - 08/15/17 0800      Date of Initial Exercise RX and Referring Provider   Date  08/15/17    Referring Provider  Dr. Vaughan Browner      Oxygen   Oxygen  Continuous    Liters  2      Recumbant Bike   Level  1    Watts  10    Minutes  17      NuStep   Level  2    SPM  80    Minutes  17    METs  1.5      Track   Laps  5    Minutes  17      Prescription Details   Frequency (times per week)  2    Duration  Progress to 45 minutes of aerobic exercise without signs/symptoms of physical distress      Intensity   THRR 40-80% of Max Heartrate  56-113    Ratings of Perceived Exertion  11-13    Perceived Dyspnea  0-4      Progression   Progression  Continue to progress workloads to maintain intensity without signs/symptoms of physical distress.      Resistance Training   Training Prescription  Yes    Weight  orange bands    Reps  10-15       Perform Capillary Blood Glucose checks as needed.  Exercise Prescription Changes: Exercise Prescription Changes    Row Name 09/09/17 1200             Response to Exercise   Blood Pressure (Admit)  122/59       Blood Pressure  (Exercise)  126/58       Blood Pressure (Exit)  110/65       Heart Rate (Admit)  84 bpm       Heart Rate (Exercise)  82 bpm       Heart Rate (Exit)  78 bpm       Oxygen Saturation (Admit)  97 %       Oxygen Saturation (Exercise)  97 %       Oxygen Saturation (Exit)  78 %       Rating of Perceived Exertion (Exercise)  13       Perceived Dyspnea (Exercise)  1       Duration  Progress to 45 minutes of aerobic exercise without signs/symptoms of physical distress       Intensity  - 40-80% HRR         Progression   Progression  Continue to progress workloads to maintain intensity without signs/symptoms of physical distress.         Resistance Training   Training Prescription  Yes       Weight  orange bands       Reps  10-15       Time  10 Minutes         Interval Training   Interval Training  No         Oxygen   Oxygen  Continuous       Liters  2         Recumbant Bike   Level  1       Watts  10       Minutes  17         NuStep   Level  2       SPM  80       Minutes  17       METs  1.6         Track   Laps  9       Minutes  17          Exercise Comments:   Exercise Goals and Review: Exercise Goals    Row Name 08/13/17 1113  Exercise Goals   Increase Physical Activity  Yes       Intervention  Provide advice, education, support and counseling about physical activity/exercise needs.;Develop an individualized exercise prescription for aerobic and resistive training based on initial evaluation findings, risk stratification, comorbidities and participant's personal goals.       Expected Outcomes  Long Term: Exercising regularly at least 3-5 days a week.;Long Term: Add in home exercise to make exercise part of routine and to increase amount of physical activity.;Short Term: Attend rehab on a regular basis to increase amount of physical activity.       Increase Strength and Stamina  Yes       Intervention  Provide advice, education, support and counseling  about physical activity/exercise needs.;Develop an individualized exercise prescription for aerobic and resistive training based on initial evaluation findings, risk stratification, comorbidities and participant's personal goals.       Expected Outcomes  Short Term: Increase workloads from initial exercise prescription for resistance, speed, and METs.;Short Term: Perform resistance training exercises routinely during rehab and add in resistance training at home;Long Term: Improve cardiorespiratory fitness, muscular endurance and strength as measured by increased METs and functional capacity (6MWT)       Able to understand and use rate of perceived exertion (RPE) scale  Yes       Intervention  Provide education and explanation on how to use RPE scale       Expected Outcomes  Short Term: Able to use RPE daily in rehab to express subjective intensity level;Long Term:  Able to use RPE to guide intensity level when exercising independently       Able to understand and use Dyspnea scale  Yes       Intervention  Provide education and explanation on how to use Dyspnea scale       Expected Outcomes  Short Term: Able to use Dyspnea scale daily in rehab to express subjective sense of shortness of breath during exertion;Long Term: Able to use Dyspnea scale to guide intensity level when exercising independently       Knowledge and understanding of Target Heart Rate Range (THRR)  Yes       Intervention  Provide education and explanation of THRR including how the numbers were predicted and where they are located for reference       Expected Outcomes  Short Term: Able to state/look up THRR;Long Term: Able to use THRR to govern intensity when exercising independently;Short Term: Able to use daily as guideline for intensity in rehab       Able to check pulse independently  Yes       Intervention  Provide education and demonstration on how to check pulse in carotid and radial arteries.;Review the importance of being able  to check your own pulse for safety during independent exercise       Expected Outcomes  Short Term: Able to explain why pulse checking is important during independent exercise;Long Term: Able to check pulse independently and accurately       Understanding of Exercise Prescription  Yes       Intervention  Provide education, explanation, and written materials on patient's individual exercise prescription       Expected Outcomes  Short Term: Able to explain program exercise prescription;Long Term: Able to explain home exercise prescription to exercise independently          Exercise Goals Re-Evaluation : Exercise Goals Re-Evaluation    [place] Name 08/20/17 1531 09/12/17 1014  Exercise Goal Re-Evaluation   Exercise Goals Review  Increase Physical Activity;Able to understand and use rate of perceived exertion (RPE) scale;Knowledge and understanding of Target Heart Rate Range (THRR);Understanding of Exercise Prescription;Increase Strength and Stamina;Able to understand and use Dyspnea scale  Increase Physical Activity;Able to understand and use rate of perceived exertion (RPE) scale;Knowledge and understanding of Target Heart Rate Range (THRR);Understanding of Exercise Prescription;Increase Strength and Stamina;Able to understand and use Dyspnea scale      Comments  -  Patient is still learning how to coordinate her blood sugar/diet/diabetes medicines on exercise days. Husband is helpful. Exercise progression is going to be a slow and probably not steady road for her due to her comorbities and extremely sedentary lifestyle. Will cont. to monitor and motivate as much as possible. Patient has attended 6 rehab sessions-not much progress has been made.       Expected Outcomes  -  Through exercise at rehab and at home, the patient will decrease shortness of breath with daily activities and feel confident in carrying out an exercise regime at home.          Discharge Exercise Prescription (Final  Exercise Prescription Changes): Exercise Prescription Changes - 09/09/17 1200      Response to Exercise   Blood Pressure (Admit)  122/59    Blood Pressure (Exercise)  126/58    Blood Pressure (Exit)  110/65    Heart Rate (Admit)  84 bpm    Heart Rate (Exercise)  82 bpm    Heart Rate (Exit)  78 bpm    Oxygen Saturation (Admit)  97 %    Oxygen Saturation (Exercise)  97 %    Oxygen Saturation (Exit)  78 %    Rating of Perceived Exertion (Exercise)  13    Perceived Dyspnea (Exercise)  1    Duration  Progress to 45 minutes of aerobic exercise without signs/symptoms of physical distress    Intensity  -- 40-80% HRR      Progression   Progression  Continue to progress workloads to maintain intensity without signs/symptoms of physical distress.      Resistance Training   Training Prescription  Yes    Weight  orange bands    Reps  10-15    Time  10 Minutes      Interval Training   Interval Training  No      Oxygen   Oxygen  Continuous    Liters  2      Recumbant Bike   Level  1    Watts  10    Minutes  17      NuStep   Level  2    SPM  80    Minutes  17    METs  1.6      Track   Laps  9    Minutes  17       Nutrition:  Target Goals: Understanding of nutrition guidelines, daily intake of sodium <1529m, cholesterol <2067m calories 30% from fat and 7% or less from saturated fats, daily to have 5 or more servings of fruits and vegetables.  Biometrics:    Nutrition Therapy Plan and Nutrition Goals: Nutrition Therapy & Goals - 08/18/17 1149      Nutrition Therapy   Diet  Consistent Carb, Heart Healthy      Intervention Plan   Intervention  Prescribe, educate and counsel regarding individualized specific dietary modifications aiming towards targeted core components such as weight, hypertension, lipid management, diabetes, heart  failure and other comorbidities.    Expected Outcomes  Short Term Goal: Understand basic principles of dietary content, such as calories,  fat, sodium, cholesterol and nutrients.;Long Term Goal: Adherence to prescribed nutrition plan.       Nutrition Assessments: Nutrition Assessments - 08/18/17 1150      Rate Your Plate Scores   Pre Score  46       Nutrition Goals Re-Evaluation:   Nutrition Goals Discharge (Final Nutrition Goals Re-Evaluation):   Psychosocial: Target Goals: Acknowledge presence or absence of significant depression and/or stress, maximize coping skills, provide positive support system. Participant is able to verbalize types and ability to use techniques and skills needed for reducing stress and depression.  Initial Review & Psychosocial Screening: Initial Psych Review & Screening - 08/13/17 1044      Initial Review   Current issues with  Current Stress Concerns;History of Depression    Source of Stress Concerns  Unable to participate in former interests or hobbies;Unable to perform yard/household activities    Comments  intra family strain      [employer]   Good Support System?  Yes      Barriers   Psychosocial barriers to participate in program  The patient should benefit from training in stress management and relaxation.      Screening Interventions   Interventions  Encouraged to exercise    Expected Outcomes  Short Term goal: Identification and review with participant of any Quality of Life or Depression concerns found by scoring the questionnaire.;Long Term goal: The participant improves quality of Life and PHQ9 Scores as seen by post scores and/or verbalization of changes       Quality of Life Scores:  Scores of 19 and below usually indicate a poorer quality of life in these areas.  A difference of  2-3 points is a clinically meaningful difference.  A difference of 2-3 points in the total score of the Quality of Life Index has been associated with significant improvement in overall quality of life, self-image, physical symptoms, and general health in studies assessing change in  quality of life.   PHQ-9: Recent Review Flowsheet Data    Depression screen [facility] 2/9 08/13/2017   Decreased Interest 1   Down, Depressed, Hopeless 1   PHQ - 2 Score 2   Altered sleeping 1   Tired, decreased energy 1   Change in appetite 2   Feeling bad or failure about yourself  1   Trouble concentrating 1   Moving slowly or fidgety/restless 1   Suicidal thoughts 0   PHQ-9 Score 9   Difficult doing work/chores Somewhat difficult     Interpretation of Total Score  Total Score Depression Severity:  1-4 = Minimal depression, 5-9 = Mild depression, 10-14 = Moderate depression, 15-19 = Moderately severe depression, 20-27 = Severe depression   Psychosocial Evaluation and Intervention: Psychosocial Evaluation - 08/20/17 1530      Psychosocial Evaluation & Interventions   Interventions  Stress management education;Relaxation education;Encouraged to exercise with the program and follow exercise prescription    Comments  Pt with prior intra family strain.  Both pt and husband are introverts and have little interaction with each other in the home    Continue Psychosocial Services   Follow up required by staff       Psychosocial Re-Evaluation: Psychosocial Re-Evaluation    [place] Name 09/15/17 1437             Psychosocial Re-Evaluation   Current issues with  History of Depression       Comments  No pressing concerns at this time       Expected Outcomes  No barriers to participation in pulmonary rehab       Interventions  Encouraged to attend Pulmonary Rehabilitation for the exercise;Relaxation education;Stress management education       Continue Psychosocial Services   No Follow up required          Psychosocial Discharge (Final Psychosocial Re-Evaluation): Psychosocial Re-Evaluation - 09/15/17 1437      Psychosocial Re-Evaluation   Current issues with  History of Depression    Comments  No pressing concerns at this time    Expected Outcomes  No barriers to participation in  pulmonary rehab    Interventions  Encouraged to attend Pulmonary Rehabilitation for the exercise;Relaxation education;Stress management education    Continue Psychosocial Services   No Follow up required        Education: Education Goals: Education classes will be provided on a weekly basis, covering required topics. Participant will state understanding/return demonstration of topics presented.  Learning Barriers/Preferences: Learning Barriers/Preferences - 08/13/17 1050      Learning Barriers/Preferences   Learning Barriers  Sight;Hearing    Learning Preferences  Computer/Internet;Verbal Instruction;Individual Instruction;Group Instruction       Education Topics: How Lungs Work and Diseases: - Discuss the anatomy of the lungs and diseases that can affect the lungs, such as COPD.   Exercise: -Discuss the importance of exercise, FITT principles of exercise, normal and abnormal responses to exercise, and how to exercise safely.   Environmental Irritants: -Discuss types of environmental irritants and how to limit exposure to environmental irritants.   Meds/Inhalers and oxygen: - Discuss respiratory medications, definition of an inhaler and oxygen, and the proper way to use an inhaler and oxygen.   Energy Saving Techniques: - Discuss methods to conserve energy and decrease shortness of breath when performing activities of daily living.    Bronchial Hygiene / Breathing Techniques: - Discuss breathing mechanics, pursed-lip breathing technique,  proper posture, effective ways to clear airways, and other functional breathing techniques   Cleaning Equipment: - Provides group verbal and written instruction about the health risks of elevated stress, cause of high stress, and healthy ways to reduce stress.   Nutrition I: Fats: - Discuss the types of cholesterol, what cholesterol does to the body, and how cholesterol levels can be controlled.   Nutrition II: Labels: -Discuss  the different components of food labels and how to read food labels.   Respiratory Infections: - Discuss the signs and symptoms of respiratory infections, ways to prevent respiratory infections, and the importance of seeking medical treatment when having a respiratory infection.   Stress I: Signs and Symptoms: - Discuss the causes of stress, how stress may lead to anxiety and depression, and ways to limit stress.   Stress II: Relaxation: -Discuss relaxation techniques to limit stress.   Oxygen for Home/Travel: - Discuss how to prepare for travel when on oxygen and proper ways to transport and store oxygen to ensure safety.   Knowledge Questionnaire Score: Knowledge Questionnaire Score - 08/26/17 1024      Knowledge Questionnaire Score   Pre Score  13/18       Core Components/Risk Factors/Patient Goals at Admission: Personal Goals and Risk Factors at Admission - 08/13/17 1023      Core Components/Risk Factors/Patient Goals on Admission    Weight Management  Yes;Weight Gain    Intervention  Weight Management: Develop a  combined nutrition and exercise program designed to reach desired caloric intake, while maintaining appropriate intake of nutrient and fiber, sodium and fats, and appropriate energy expenditure required for the weight goal.;Weight Management: Provide education and appropriate resources to help participant work on and attain dietary goals.;Weight Management/Obesity: Establish reasonable short term and long term weight goals.;Obesity: Provide education and appropriate resources to help participant work on and attain dietary goals.    Admit Weight  114 lb 3.2 oz (51.8 kg)    Goal Weight: Short Term  118 lb (53.5 kg)    Goal Weight: Long Term  120 lb (54.4 kg)    Expected Outcomes  Short Term: Continue to assess and modify interventions until short term weight is achieved;Understanding of distribution of calorie intake throughout the day with the consumption of 4-5  meals/snacks;Weight Gain: Understanding of general recommendations for a high calorie, high protein meal plan that promotes weight gain by distributing calorie intake throughout the day with the consumption for 4-5 meals, snacks, and/or supplements;Long Term: Adherence to nutrition and physical activity/exercise program aimed toward attainment of established weight goal    Improve shortness of breath with ADL's  Yes    Intervention  Provide education, individualized exercise plan and daily activity instruction to help decrease symptoms of SOB with activities of daily living.    Expected Outcomes  Short Term: Improve cardiorespiratory fitness to achieve a reduction of symptoms when performing ADLs;Long Term: Be able to perform more ADLs without symptoms or delay the onset of symptoms    Diabetes  Yes    Intervention  Provide education about signs/symptoms and action to take for hypo/hyperglycemia.;Provide education about proper nutrition, including hydration, and aerobic/resistive exercise prescription along with prescribed medications to achieve blood glucose in normal ranges: Fasting glucose 65-99 mg/dL    Expected Outcomes  Short Term: Participant verbalizes understanding of the signs/symptoms and immediate care of hyper/hypoglycemia, proper foot care and importance of medication, aerobic/resistive exercise and nutrition plan for blood glucose control.;Long Term: Attainment of HbA1C < 7%.    Lipids  Yes    Intervention  Provide education and support for participant on nutrition & aerobic/resistive exercise along with prescribed medications to achieve LDL <40m, HDL >476m    Expected Outcomes  Short Term: Participant states understanding of desired cholesterol values and is compliant with medications prescribed. Participant is following exercise prescription and nutrition guidelines.;Long Term: Cholesterol controlled with medications as prescribed, with individualized exercise RX and with personalized  nutrition plan. Value goals: LDL < 7058mHDL > 40 mg.    Stress  Yes    Intervention  Offer individual and/or small group education and counseling on adjustment to heart disease, stress management and health-related lifestyle change. Teach and support self-help strategies.    Expected Outcomes  Short Term: Participant demonstrates changes in health-related behavior, relaxation and other stress management skills, ability to obtain effective social support, and compliance with psychotropic medications if prescribed.;Long Term: Emotional wellbeing is indicated by absence of clinically significant psychosocial distress or social isolation.       Core Components/Risk Factors/Patient Goals Review:  Goals and Risk Factor Review    Row Name 08/20/17 1528 09/15/17 1434           Core Components/Risk Factors/Patient Goals Review   Personal Goals Review  Weight Management/Obesity;Stress;Improve shortness of breath with ADL's;Diabetes;Lipids  Weight Management/Obesity;Improve shortness of breath with ADL's;Diabetes;Lipids;Hypertension;Stress weight gain      Review  Pt will begin pulmonary rehab on 08/21/17.  Unable to assess her  goals at this time.  I do anticipate that pt will be able to demonstrate progress toward achieving the expected outcomes in the next 30 days.  has attended 6 exercise sessions, CBG's have been low pre-exersie, lots of education re: proper nutrition before and after exercising, settling out. Slow progression      Expected Outcomes  -  see admission outcomes         Core Components/Risk Factors/Patient Goals at Discharge (Final Review):  Goals and Risk Factor Review - 09/15/17 1434      Core Components/Risk Factors/Patient Goals Review   Personal Goals Review  Weight Management/Obesity;Improve shortness of breath with ADL's;Diabetes;Lipids;Hypertension;Stress weight gain    Review  has attended 6 exercise sessions, CBG's have been low pre-exersie, lots of education re: proper  nutrition before and after exercising, settling out. Slow progression    Expected Outcomes  see admission outcomes       ITP Comments: ITP Comments    Row Name 08/13/17 1013 08/20/17 1525         ITP Comments  Dr. Jennet Maduro, Medical Director  Dr. Jennet Maduro, Medical Director         Comments: ITP REVIEW Pt is making expected progress toward pulmonary rehab goals after completing 8 sessions. Recommend continued exercise, life style modification, education, and utilization of breathing techniques to increase stamina and strength and decrease shortness of breath with exertion.

## 2017-09-16 NOTE — Progress Notes (Signed)
Daily Session Note  Patient Details  Name: Heidi Moran MRN: [id number] Date of Birth: 12/07/1935 Referring Provider:     Pulmonary Rehab Walk Test from 08/14/2017 in [place]  Referring Provider  Dr. Vaughan Browner      Encounter Date: 09/16/2017  Check In: Session Check In - 09/16/17 1030      Check-In   Location  [facility]    Staff Present  Rosebud Poles, RN, BSN;Carlette Carlton, RN, [facility], MS, ACSM RCEP, Exercise Physiologist;Lisa Ysidro Evert, Felipe Drone, RN, [facility]    Supervising physician immediately available to respond to emergencies  [facility] immediately available    Physician(s)  Dr. Bonner Puna    Medication changes reported      No    Fall or balance concerns reported     No    Tobacco Cessation  No Change    Warm-up and Cool-down  Performed as group-led instruction    Resistance Training Performed  Yes    VAD Patient?  No      Pain Assessment   Currently in Pain?  No/denies    Multiple Pain Sites  No       Capillary Blood Glucose: No results found for this or any previous visit (from the past 24 hour(s)).    Social History   Tobacco Use  Smoking Status Never Smoker  Smokeless Tobacco Never Used    Goals Met:  Exercise tolerated well Strength training completed today  Goals Unmet:  Not Applicable  Comments: Service time is from 1030 to [place]   Dr. Rush Farmer is Medical Director for Pulmonary Rehab at [facility].

## 2017-09-23 ENCOUNTER — Encounter (HOSPITAL_COMMUNITY)
Admission: RE | Admit: 2017-09-23 | Discharge: 2017-09-23 | Disposition: A | Discharge status: Home / Self Care | Payer: Medicare Other | Source: Ambulatory Visit | Attending: Pulmonary Disease | Admitting: Pulmonary Disease

## 2017-09-23 VITALS — Wt 114.4 lb

## 2017-09-23 DIAGNOSIS — J9611 Chronic respiratory failure with hypoxia: Secondary | ICD-10-CM | POA: Diagnosis not present

## 2017-09-23 NOTE — Progress Notes (Signed)
Daily Session Note  Patient Details  Name: Heidi Moran MRN: [id number] Date of Birth: 15-Oct-1935 Referring Provider:     Pulmonary Rehab Walk Test from 08/14/2017 in [place]  Referring Provider  Dr. Vaughan Browner      Encounter Date: 09/23/2017  Check In: Session Check In - 09/23/17 1030      Check-In   Location  [facility]    Staff Present  Rosebud Poles, RN, BSN;Carlette Wilber Oliphant, RN, BSN;Lisa Ysidro Evert, Felipe Drone, RN, [facility]    Supervising physician immediately available to respond to emergencies  [facility] immediately available    Physician(s)  Dr. Verlon Au    Medication changes reported      No    Fall or balance concerns reported     No    Tobacco Cessation  No Change    Warm-up and Cool-down  Performed as group-led instruction    Resistance Training Performed  Yes    VAD Patient?  No    PAD/SET Patient?  No      Pain Assessment   Currently in Pain?  No/denies    Multiple Pain Sites  No       Capillary Blood Glucose: No results found for this or any previous visit (from the past 24 hour(s)).  Exercise Prescription Changes - 09/23/17 1200      Response to Exercise   Blood Pressure (Admit)  124/60    Blood Pressure (Exercise)  136/60    Blood Pressure (Exit)  140/60    Heart Rate (Admit)  77 bpm    Heart Rate (Exercise)  85 bpm    Heart Rate (Exit)  78 bpm    Oxygen Saturation (Admit)  98 %    Oxygen Saturation (Exercise)  98 %    Oxygen Saturation (Exit)  98 %    Rating of Perceived Exertion (Exercise)  13    Perceived Dyspnea (Exercise)  2    Duration  Progress to 45 minutes of aerobic exercise without signs/symptoms of physical distress    Intensity  THRR unchanged 40-80% HRR      Progression   Progression  Continue to progress workloads to maintain intensity without signs/symptoms of physical distress.      Resistance Training   Training Prescription  Yes    Weight  orange bands    Reps  10-15     Time  10 Minutes      Interval Training   Interval Training  No      Oxygen   Oxygen  Continuous    Liters  2      Recumbant Bike   Level  1    Minutes  17      NuStep   Level  2    SPM  80    Minutes  17    METs  1.7      Track   Laps  10    Minutes  17       Social History   Tobacco Use  Smoking Status Never Smoker  Smokeless Tobacco Never Used    Goals Met:  Exercise tolerated well Strength training completed today  Goals Unmet:  Not Applicable  Comments: Service time is from 1030 to 1210    Dr. Rush Farmer is Medical Director for Pulmonary Rehab at [facility].

## 2017-09-25 ENCOUNTER — Encounter (HOSPITAL_COMMUNITY)
Admission: RE | Admit: 2017-09-25 | Discharge: 2017-09-25 | Disposition: A | Discharge status: Home / Self Care | Payer: Medicare Other | Source: Ambulatory Visit | Attending: Pulmonary Disease | Admitting: Pulmonary Disease

## 2017-09-25 VITALS — Wt 114.0 lb

## 2017-09-25 DIAGNOSIS — J9611 Chronic respiratory failure with hypoxia: Secondary | ICD-10-CM | POA: Diagnosis not present

## 2017-09-25 NOTE — Progress Notes (Signed)
Daily Session Note  Patient Details  Name: Heidi Moran MRN: [id number] Date of Birth: 08-29-1935 Referring Provider:     Pulmonary Rehab Walk Test from 08/14/2017 in [place]  Referring Provider  Dr. Vaughan Browner      Encounter Date: 09/25/2017  Check In: Session Check In - 09/25/17 1030      Check-In   Location  [facility]    Staff Present  Rosebud Poles, RN, BSN;Carlette Wilber Oliphant, RN, BSN;Lisa Ysidro Evert, Felipe Drone, RN, [facility]    Supervising physician immediately available to respond to emergencies  [facility] immediately available    Physician(s)  Dr. Broadus John    Medication changes reported      No    Fall or balance concerns reported     No    Tobacco Cessation  No Change    Warm-up and Cool-down  Performed as group-led instruction    Resistance Training Performed  Yes    VAD Patient?  No    PAD/SET Patient?  No      Pain Assessment   Currently in Pain?  No/denies    Multiple Pain Sites  No       Capillary Blood Glucose: No results found for this or any previous visit (from the past 24 hour(s)).    Social History   Tobacco Use  Smoking Status Never Smoker  Smokeless Tobacco Never Used    Goals Met:  Exercise tolerated well Strength training completed today  Goals Unmet:  Not Applicable  Comments: Service time is from 1030 to 1225    Dr. Rush Farmer is Medical Director for Pulmonary Rehab at [facility].

## 2017-09-30 ENCOUNTER — Encounter (HOSPITAL_COMMUNITY)
Admission: RE | Admit: 2017-09-30 | Discharge: 2017-09-30 | Disposition: A | Discharge status: Home / Self Care | Payer: Medicare Other | Source: Ambulatory Visit | Attending: Pulmonary Disease | Admitting: Pulmonary Disease

## 2017-09-30 VITALS — Wt 114.0 lb

## 2017-09-30 DIAGNOSIS — J9611 Chronic respiratory failure with hypoxia: Secondary | ICD-10-CM

## 2017-09-30 NOTE — Progress Notes (Signed)
Daily Session Note  Patient Details  Name: Heidi Moran MRN: [id number] Date of Birth: 08-22-1935 Referring Provider:     Pulmonary Rehab Walk Test from 08/14/2017 in [place]  Referring Provider  Dr. Vaughan Browner      Encounter Date: 09/30/2017  Check In: Session Check In - 09/30/17 1030      Check-In   Location  [facility]    Staff Present  Rosebud Poles, RN, BSN;Carlette Carlton, RN, [facility], MS, ACSM RCEP, Exercise Physiologist;Lisa Ysidro Evert, Felipe Drone, RN, [facility]    Supervising physician immediately available to respond to emergencies  [facility] immediately available    Physician(s)  Dr. Broadus John    Medication changes reported      No    Fall or balance concerns reported     No    Tobacco Cessation  No Change    Warm-up and Cool-down  Performed as group-led instruction    Resistance Training Performed  Yes    VAD Patient?  No    PAD/SET Patient?  No      Pain Assessment   Currently in Pain?  No/denies    Multiple Pain Sites  No       Capillary Blood Glucose: No results found for this or any previous visit (from the past 24 hour(s)).    Social History   Tobacco Use  Smoking Status Never Smoker  Smokeless Tobacco Never Used    Goals Met:  Exercise tolerated well Strength training completed today  Goals Unmet:  Not Applicable  Comments: Service time is from 1030 to 1205    Dr. Rush Farmer is Medical Director for Pulmonary Rehab at [facility].

## 2017-10-02 ENCOUNTER — Encounter (HOSPITAL_COMMUNITY)
Admission: RE | Admit: 2017-10-02 | Discharge: 2017-10-02 | Disposition: A | Discharge status: Home / Self Care | Payer: Medicare Other | Source: Ambulatory Visit | Attending: Pulmonary Disease | Admitting: Pulmonary Disease

## 2017-10-02 VITALS — Wt 113.3 lb

## 2017-10-02 DIAGNOSIS — J9611 Chronic respiratory failure with hypoxia: Secondary | ICD-10-CM

## 2017-10-02 NOTE — Progress Notes (Signed)
Daily Session Note  Patient Details  Name: Heidi Moran MRN: [id number] Date of Birth: 1935/10/05 Referring Provider:     Pulmonary Rehab Walk Test from 08/14/2017 in [place]  Referring Provider  Dr. Vaughan Browner      Encounter Date: 10/02/2017  Check In: Session Check In - 10/02/17 1030      Check-In   Location  [facility]    Staff Present  Rosebud Poles, RN, BSN;Carlette Carlton, RN, [facility], MS, ACSM RCEP, Exercise Physiologist;Lisa Ysidro Evert, Felipe Drone, RN, [facility]    Supervising physician immediately available to respond to emergencies  [facility] immediately available    Physician(s)  Dr. Denton Brick    Medication changes reported      No    Fall or balance concerns reported     No    Tobacco Cessation  No Change    Warm-up and Cool-down  Performed as group-led instruction    Resistance Training Performed  Yes    VAD Patient?  No    PAD/SET Patient?  No      Pain Assessment   Currently in Pain?  No/denies    Multiple Pain Sites  No       Capillary Blood Glucose: No results found for this or any previous visit (from the past 24 hour(s)).    Social History   Tobacco Use  Smoking Status Never Smoker  Smokeless Tobacco Never Used    Goals Met:  Exercise tolerated well Strength training completed today  Goals Unmet:  Not Applicable  Comments: Service time is from 1030 to [place]    Dr. Rush Farmer is Medical Director for Pulmonary Rehab at [facility].

## 2017-10-06 NOTE — Progress Notes (Signed)
Pulmonary Individual Treatment Plan  Patient Details  Name: JAYCIE KREGEL MRN: [id number] Date of Birth: [birth year] Referring Provider:     Pulmonary Rehab Walk Test from 08/14/2017 in [place]  Referring Provider  Dr. Vaughan Browner      Initial Encounter Date:    Pulmonary Rehab Walk Test from 08/14/2017 in [place]  Date  08/15/17      Visit Diagnosis: Chronic respiratory failure with hypoxia ([place])  Patient's Home Medications on Admission:   Current Outpatient Medications:  .  atorvastatin (LIPITOR) 20 MG tablet, Take 20 mg by mouth daily., Disp: , Rfl:  .  Calcium-Vitamin D-Vitamin K (VIACTIV PO), Take 1 tablet by mouth daily., Disp: , Rfl:  .  glyBURIDE-metformin (GLUCOVANCE) 2.5-500 MG tablet, Take 1 tablet by mouth daily with breakfast. Pt takes one in the morning with breakfast and one in the evening, Disp: , Rfl:  .  ipratropium-albuterol (DUONEB) 0.5-2.5 (3) MG/3ML SOLN, Take 3 mLs by nebulization every 4 (four) hours as needed. (Patient not taking: Reported on 06/24/2017), Disp: 360 mL, Rfl:  .  lisinopril (PRINIVIL,ZESTRIL) 10 MG tablet, Take 10 mg by mouth daily., Disp: , Rfl:  .  OVER THE COUNTER MEDICATION, , Disp: , Rfl:   Past Medical History: Past Medical History:  Diagnosis Date  . Anemia   . Diabetes mellitus without complication ([place])   . Eczema   . Hyperlipidemia   . Hypertension   . Osteoarthritis   . Renal disorder     Tobacco Use: Social History   Tobacco Use  Smoking Status Never Smoker  Smokeless Tobacco Never Used    Labs: Recent Review Scientist, physiological    Labs for ITP Cardiac and Pulmonary Rehab Latest Ref Rng & Units 11/03/2015 01/28/2017   TCO2 22 - 32 mmol/L 26 26      Capillary Blood Glucose: Lab Results  Component Value Date   GLUCAP 213 (H) 09/02/2017   GLUCAP 113 (H) 09/02/2017   GLUCAP 120 (H) 08/28/2017   GLUCAP 295 (H) 05/21/2017   GLUCAP 115 (H) 05/21/2017    POCT Glucose    Row Name 08/26/17 1209 10/07/17 1121 10/07/17 1211         POCT Blood Glucose   Pre-Exercise  327 mg/dL  189 mg/dL  -     Post-Exercise  -  -  102 mg/dL        Pulmonary Assessment Scores: Pulmonary Assessment Scores    Row Name 08/15/17 0842 08/26/17 1025       ADL UCSD   ADL Phase  Entry  Entry    SOB Score total  -  37      CAT Score   CAT Score  -  15      mMRC Score   mMRC Score  1  -       Pulmonary Function Assessment:   Exercise Target Goals:    Exercise Program Goal: Individual exercise prescription set using results from initial 6 min walk test and THRR while considering  patient's activity barriers and safety.    Exercise Prescription Goal: Initial exercise prescription builds to 30-45 minutes a day of aerobic activity, 2-3 days per week.  Home exercise guidelines will be given to patient during program as part of exercise prescription that the participant will acknowledge.  Activity Barriers & Risk Stratification: Activity Barriers & Cardiac Risk Stratification - 08/13/17 1210      Activity Barriers & Cardiac Risk  Stratification   Activity Barriers  Arthritis;Balance Concerns;History of Falls;Shortness of Breath;Deconditioning;Joint Problems       6 Minute Walk: 6 Minute Walk    Row Name 08/15/17 0839         6 Minute Walk   Phase  Initial     Distance  1000 feet     Walk Time  6 minutes     # of Rest Breaks  0     MPH  1.89     METS  2.45     RPE  13     Perceived Dyspnea   3     Symptoms  Yes (comment)     Comments  used wheelchair     Resting HR  86 bpm     Resting BP  118/58     Resting Oxygen Saturation   97 %     Exercise Oxygen Saturation  during 6 min walk  92 %     Max Ex. HR  107 bpm     Max Ex. BP  140/60       Interval HR   1 Minute HR  97     2 Minute HR  102     3 Minute HR  105     4 Minute HR  107     5 Minute HR  107     6 Minute HR  107     2 Minute Post HR  103     Interval Heart Rate?   Yes       Interval Oxygen   Interval Oxygen?  Yes     Baseline Oxygen Saturation %  97 %     1 Minute Oxygen Saturation %  100 %     1 Minute Liters of Oxygen  2 L     2 Minute Oxygen Saturation %  99 %     2 Minute Liters of Oxygen  2 L     3 Minute Oxygen Saturation %  97 %     3 Minute Liters of Oxygen  2 L     4 Minute Oxygen Saturation %  93 %     4 Minute Liters of Oxygen  2 L     5 Minute Oxygen Saturation %  93 %     5 Minute Liters of Oxygen  2 L     6 Minute Oxygen Saturation %  92 %     6 Minute Liters of Oxygen  2 L     2 Minute Post Oxygen Saturation %  96 %     2 Minute Post Liters of Oxygen  2 L        Oxygen Initial Assessment: Oxygen Initial Assessment - 08/15/17 0839      Initial 6 min Walk   Oxygen Used  Continuous;E-Tanks    Liters per minute  2      Program Oxygen Prescription   Program Oxygen Prescription  Continuous;E-Tanks    Liters per minute  2       Oxygen Re-Evaluation: Oxygen Re-Evaluation    Row Name 08/20/17 1526 09/12/17 1013 10/06/17 0920         Program Oxygen Prescription   Program Oxygen Prescription  Continuous;E-Tanks  Continuous;E-Tanks  Continuous;E-Tanks     Liters per minute  2  2  2        Home Oxygen   Home Oxygen Device  Portable Concentrator;Home Concentrator  Portable Concentrator;Home Concentrator  Portable Concentrator;Home  Concentrator     Sleep Oxygen Prescription  Continuous  Continuous  Continuous     Liters per minute  2  2  2      Home Exercise Oxygen Prescription  Continuous  Continuous  Continuous     Liters per minute  2  2  2      Home at Rest Exercise Oxygen Prescription  Pulsed  Pulsed  Pulsed     Liters per minute  -  -  2     Compliance with Home Oxygen Use  Yes  Yes  Yes       Goals/Expected Outcomes   Short Term Goals  To learn and exhibit compliance with exercise, home and travel O2 prescription;To learn and understand importance of maintaining oxygen saturations>88%;To learn and demonstrate proper  use of respiratory medications;To learn and understand importance of monitoring SPO2 with pulse oximeter and demonstrate accurate use of the pulse oximeter.;To learn and demonstrate proper pursed lip breathing techniques or other breathing techniques.  To learn and exhibit compliance with exercise, home and travel O2 prescription;To learn and understand importance of maintaining oxygen saturations>88%;To learn and demonstrate proper use of respiratory medications;To learn and understand importance of monitoring SPO2 with pulse oximeter and demonstrate accurate use of the pulse oximeter.;To learn and demonstrate proper pursed lip breathing techniques or other breathing techniques.  To learn and exhibit compliance with exercise, home and travel O2 prescription;To learn and understand importance of maintaining oxygen saturations>88%;To learn and demonstrate proper use of respiratory medications;To learn and understand importance of monitoring SPO2 with pulse oximeter and demonstrate accurate use of the pulse oximeter.;To learn and demonstrate proper pursed lip breathing techniques or other breathing techniques.     Long  Term Goals  Exhibits compliance with exercise, home and travel O2 prescription;Verbalizes importance of monitoring SPO2 with pulse oximeter and return demonstration;Maintenance of O2 saturations>88%;Exhibits proper breathing techniques, such as pursed lip breathing or other method taught during program session;Compliance with respiratory medication  Exhibits compliance with exercise, home and travel O2 prescription;Verbalizes importance of monitoring SPO2 with pulse oximeter and return demonstration;Maintenance of O2 saturations>88%;Exhibits proper breathing techniques, such as pursed lip breathing or other method taught during program session;Compliance with respiratory medication  Exhibits compliance with exercise, home and travel O2 prescription;Verbalizes importance of monitoring SPO2 with  pulse oximeter and return demonstration;Maintenance of O2 saturations>88%;Exhibits proper breathing techniques, such as pursed lip breathing or other method taught during program session;Compliance with respiratory medication     Goals/Expected Outcomes  -  Compliance and understanding  Compliance and understanding        Oxygen Discharge (Final Oxygen Re-Evaluation): Oxygen Re-Evaluation - 10/06/17 0920      Program Oxygen Prescription   Program Oxygen Prescription  Continuous;E-Tanks    Liters per minute  2      Home Oxygen   Home Oxygen Device  Portable Concentrator;Home Concentrator    Sleep Oxygen Prescription  Continuous    Liters per minute  2    Home Exercise Oxygen Prescription  Continuous    Liters per minute  2    Home at Rest Exercise Oxygen Prescription  Pulsed    Liters per minute  2    Compliance with Home Oxygen Use  Yes      Goals/Expected Outcomes   Short Term Goals  To learn and exhibit compliance with exercise, home and travel O2 prescription;To learn and understand importance of maintaining oxygen saturations>88%;To learn and demonstrate proper use of respiratory medications;To learn and understand importance  of monitoring SPO2 with pulse oximeter and demonstrate accurate use of the pulse oximeter.;To learn and demonstrate proper pursed lip breathing techniques or other breathing techniques.    Long  Term Goals  Exhibits compliance with exercise, home and travel O2 prescription;Verbalizes importance of monitoring SPO2 with pulse oximeter and return demonstration;Maintenance of O2 saturations>88%;Exhibits proper breathing techniques, such as pursed lip breathing or other method taught during program session;Compliance with respiratory medication    Goals/Expected Outcomes  Compliance and understanding       Initial Exercise Prescription: Initial Exercise Prescription - 08/15/17 0800      Date of Initial Exercise RX and Referring Provider   Date  08/15/17     Referring Provider  Dr. Vaughan Browner      Oxygen   Oxygen  Continuous    Liters  2      Recumbant Bike   Level  1    Watts  10    Minutes  17      NuStep   Level  2    SPM  80    Minutes  17    METs  1.5      Track   Laps  5    Minutes  17      Prescription Details   Frequency (times per week)  2    Duration  Progress to 45 minutes of aerobic exercise without signs/symptoms of physical distress      Intensity   THRR 40-80% of Max Heartrate  56-113    Ratings of Perceived Exertion  11-13    Perceived Dyspnea  0-4      Progression   Progression  Continue to progress workloads to maintain intensity without signs/symptoms of physical distress.      Resistance Training   Training Prescription  Yes    Weight  orange bands    Reps  10-15       Perform Capillary Blood Glucose checks as needed.  Exercise Prescription Changes:  Exercise Prescription Changes    Row Name 09/09/17 1200 09/23/17 1200 10/07/17 1200         Response to Exercise   Blood Pressure (Admit)  122/59  124/60  116/52     Blood Pressure (Exercise)  126/58  136/60  124/58     Blood Pressure (Exit)  110/65  140/60  98/50     Heart Rate (Admit)  84 bpm  77 bpm  77 bpm     Heart Rate (Exercise)  82 bpm  85 bpm  92 bpm     Heart Rate (Exit)  78 bpm  78 bpm  89 bpm     Oxygen Saturation (Admit)  97 %  98 %  100 %     Oxygen Saturation (Exercise)  97 %  98 %  98 %     Oxygen Saturation (Exit)  78 %  98 %  97 %     Rating of Perceived Exertion (Exercise)  13  13  12      Perceived Dyspnea (Exercise)  1  2  2      Duration  Progress to 45 minutes of aerobic exercise without signs/symptoms of physical distress  Progress to 45 minutes of aerobic exercise without signs/symptoms of physical distress  Progress to 45 minutes of aerobic exercise without signs/symptoms of physical distress     Intensity  - 40-80% HRR  THRR unchanged 40-80% HRR  THRR unchanged 40-80% HRR       Progression   Progression  Continue to  progress workloads to maintain intensity without signs/symptoms of physical distress.  Continue to progress workloads to maintain intensity without signs/symptoms of physical distress.  Continue to progress workloads to maintain intensity without signs/symptoms of physical distress.       Resistance Training   Training Prescription  Yes  Yes  Yes     Weight  orange bands  orange bands  orange bands     Reps  10-15  10-15  10-15     Time  10 Minutes  10 Minutes  10 Minutes       Interval Training   Interval Training  No  No  No       Oxygen   Oxygen  Continuous  Continuous  Continuous     Liters  2  2  2        Recumbant Bike   Level  1  1  1.5     Watts  10  -  -     Minutes  17  17  17        NuStep   Level  2  2  3      SPM  80  80  80     Minutes  17  17  17      METs  1.6  1.7  1.8       Track   Laps  9  10  9      Minutes  17  17  17         Exercise Comments:   Exercise Goals and Review:  Exercise Goals    Row Name 08/13/17 1113             Exercise Goals   Increase Physical Activity  Yes       Intervention  Provide advice, education, support and counseling about physical activity/exercise needs.;Develop an individualized exercise prescription for aerobic and resistive training based on initial evaluation findings, risk stratification, comorbidities and participant's personal goals.       Expected Outcomes  Long Term: Exercising regularly at least 3-5 days a week.;Long Term: Add in home exercise to make exercise part of routine and to increase amount of physical activity.;Short Term: Attend rehab on a regular basis to increase amount of physical activity.       Increase Strength and Stamina  Yes       Intervention  Provide advice, education, support and counseling about physical activity/exercise needs.;Develop an individualized exercise prescription for aerobic and resistive training based on initial evaluation findings, risk stratification, comorbidities and  participant's personal goals.       Expected Outcomes  Short Term: Increase workloads from initial exercise prescription for resistance, speed, and METs.;Short Term: Perform resistance training exercises routinely during rehab and add in resistance training at home;Long Term: Improve cardiorespiratory fitness, muscular endurance and strength as measured by increased METs and functional capacity (6MWT)       Able to understand and use rate of perceived exertion (RPE) scale  Yes       Intervention  Provide education and explanation on how to use RPE scale       Expected Outcomes  Short Term: Able to use RPE daily in rehab to express subjective intensity level;Long Term:  Able to use RPE to guide intensity level when exercising independently       Able to understand and use Dyspnea scale  Yes       Intervention  Provide education and explanation on how to  use Dyspnea scale       Expected Outcomes  Short Term: Able to use Dyspnea scale daily in rehab to express subjective sense of shortness of breath during exertion;Long Term: Able to use Dyspnea scale to guide intensity level when exercising independently       Knowledge and understanding of Target Heart Rate Range (THRR)  Yes       Intervention  Provide education and explanation of THRR including how the numbers were predicted and where they are located for reference       Expected Outcomes  Short Term: Able to state/look up THRR;Long Term: Able to use THRR to govern intensity when exercising independently;Short Term: Able to use daily as guideline for intensity in rehab       Able to check pulse independently  Yes       Intervention  Provide education and demonstration on how to check pulse in carotid and radial arteries.;Review the importance of being able to check your own pulse for safety during independent exercise       Expected Outcomes  Short Term: Able to explain why pulse checking is important during independent exercise;Long Term: Able to check  pulse independently and accurately       Understanding of Exercise Prescription  Yes       Intervention  Provide education, explanation, and written materials on patient's individual exercise prescription       Expected Outcomes  Short Term: Able to explain program exercise prescription;Long Term: Able to explain home exercise prescription to exercise independently          Exercise Goals Re-Evaluation : Exercise Goals Re-Evaluation    Row Name 08/20/17 1531 09/12/17 1014 10/06/17 0922         Exercise Goal Re-Evaluation   Exercise Goals Review  Increase Physical Activity;Able to understand and use rate of perceived exertion (RPE) scale;Knowledge and understanding of Target Heart Rate Range (THRR);Understanding of Exercise Prescription;Increase Strength and Stamina;Able to understand and use Dyspnea scale  Increase Physical Activity;Able to understand and use rate of perceived exertion (RPE) scale;Knowledge and understanding of Target Heart Rate Range (THRR);Understanding of Exercise Prescription;Increase Strength and Stamina;Able to understand and use Dyspnea scale  Increase Physical Activity;Able to understand and use rate of perceived exertion (RPE) scale;Knowledge and understanding of Target Heart Rate Range (THRR);Understanding of Exercise Prescription;Increase Strength and Stamina;Able to understand and use Dyspnea scale     Comments  -  Patient is still learning how to coordinate her blood sugar/diet/diabetes medicines on exercise days. Husband is helpful. Exercise progression is going to be a slow and probably not steady road for her due to her comorbities and extremely sedentary lifestyle. Will cont. to monitor and motivate as much as possible. Patient has attended 6 rehab sessions-not much progress has been made.   Patient is still learning how to coordinate her blood sugar/diet/diabetes medicines on exercise days. Husband is helpful. Exercise progression is going to be a slow and probably  not steady road for her due to her comorbities and extremely sedentary lifestyle. Patient is able to walk 9-10 laps (200 ft each) in 15 minutes without an assistive device. Will cont. to monitor and motivate as much as possible.      Expected Outcomes  -  Through exercise at rehab and at home, the patient will decrease shortness of breath with daily activities and feel confident in carrying out an exercise regime at home.   Through exercise at rehab and at home, the patient will  decrease shortness of breath with daily activities and feel confident in carrying out an exercise regime at home.         Discharge Exercise Prescription (Final Exercise Prescription Changes): Exercise Prescription Changes - 10/07/17 1200      Response to Exercise   Blood Pressure (Admit)  116/52    Blood Pressure (Exercise)  124/58    Blood Pressure (Exit)  98/50    Heart Rate (Admit)  77 bpm    Heart Rate (Exercise)  92 bpm    Heart Rate (Exit)  89 bpm    Oxygen Saturation (Admit)  100 %    Oxygen Saturation (Exercise)  98 %    Oxygen Saturation (Exit)  97 %    Rating of Perceived Exertion (Exercise)  12    Perceived Dyspnea (Exercise)  2    Duration  Progress to 45 minutes of aerobic exercise without signs/symptoms of physical distress    Intensity  THRR unchanged 40-80% HRR      Progression   Progression  Continue to progress workloads to maintain intensity without signs/symptoms of physical distress.      Resistance Training   Training Prescription  Yes    Weight  orange bands    Reps  10-15    Time  10 Minutes      Interval Training   Interval Training  No      Oxygen   Oxygen  Continuous    Liters  2      Recumbant Bike   Level  1.5    Minutes  17      NuStep   Level  3    SPM  80    Minutes  17    METs  1.8      Track   Laps  9    Minutes  17       Nutrition:  Target Goals: Understanding of nutrition guidelines, daily intake of sodium <1553m, cholesterol <2019m calories 30%  from fat and 7% or less from saturated fats, daily to have 5 or more servings of fruits and vegetables.  Biometrics:    Nutrition Therapy Plan and Nutrition Goals: Nutrition Therapy & Goals - 08/18/17 1149      Nutrition Therapy   Diet  Consistent Carb, Heart Healthy      Intervention Plan   Intervention  Prescribe, educate and counsel regarding individualized specific dietary modifications aiming towards targeted core components such as weight, hypertension, lipid management, diabetes, heart failure and other comorbidities.    Expected Outcomes  Short Term Goal: Understand basic principles of dietary content, such as calories, fat, sodium, cholesterol and nutrients.;Long Term Goal: Adherence to prescribed nutrition plan.       Nutrition Assessments: Nutrition Assessments - 08/18/17 1150      Rate Your Plate Scores   Pre Score  46       Nutrition Goals Re-Evaluation:   Nutrition Goals Discharge (Final Nutrition Goals Re-Evaluation):   Psychosocial: Target Goals: Acknowledge presence or absence of significant depression and/or stress, maximize coping skills, provide positive support system. Participant is able to verbalize types and ability to use techniques and skills needed for reducing stress and depression.  Initial Review & Psychosocial Screening: Initial Psych Review & Screening - 08/13/17 1044      Initial Review   Current issues with  Current Stress Concerns;History of Depression    Source of Stress Concerns  Unable to participate in former interests or hobbies;Unable to perform yard/household activities  Comments  intra family strain      Family Dynamics   Good Support System?  Yes      Barriers   Psychosocial barriers to participate in program  The patient should benefit from training in stress management and relaxation.      Screening Interventions   Interventions  Encouraged to exercise    Expected Outcomes  Short Term goal: Identification and review  with participant of any Quality of Life or Depression concerns found by scoring the questionnaire.;Long Term goal: The participant improves quality of Life and PHQ9 Scores as seen by post scores and/or verbalization of changes       Quality of Life Scores:  Scores of 19 and below usually indicate a poorer quality of life in these areas.  A difference of  2-3 points is a clinically meaningful difference.  A difference of 2-3 points in the total score of the Quality of Life Index has been associated with significant improvement in overall quality of life, self-image, physical symptoms, and general health in studies assessing change in quality of life.   PHQ-9: Recent Review Flowsheet Data    Depression screen [facility] 2/9 08/13/2017   Decreased Interest 1   Down, Depressed, Hopeless 1   PHQ - 2 Score 2   Altered sleeping 1   Tired, decreased energy 1   Change in appetite 2   Feeling bad or failure about yourself  1   Trouble concentrating 1   Moving slowly or fidgety/restless 1   Suicidal thoughts 0   PHQ-9 Score 9   Difficult doing work/chores Somewhat difficult     Interpretation of Total Score  Total Score Depression Severity:  1-4 = Minimal depression, 5-9 = Mild depression, 10-14 = Moderate depression, 15-19 = Moderately severe depression, 20-27 = Severe depression   Psychosocial Evaluation and Intervention: Psychosocial Evaluation - 10/06/17 1557      Psychosocial Evaluation & Interventions   Interventions  Stress management education;Relaxation education;Encouraged to exercise with the program and follow exercise prescription    Comments  Pt with prior intra family strain.  Both pt and husband are introverts and have little interaction with each other in the home Pt husband supportive of her rehab and brings her each time.    Expected Outcomes  Pt will develop friendships to increase her social interaction.  Pt will interact with other participatnts    Continue Psychosocial  Services   Follow up required by staff       Psychosocial Re-Evaluation: Psychosocial Re-Evaluation    [place] Name 09/15/17 1437 10/06/17 1558           Psychosocial Re-Evaluation   Current issues with  History of Depression  History of Depression      Comments  No pressing concerns at this time  No pressing concerns at this time pt has settled in the routine of pulmonary rehab.  Pt geniunely looks forward to coming to rehab.      Expected Outcomes  No barriers to participation in pulmonary rehab  No barriers to participation in pulmonary rehab      Interventions  Encouraged to attend Pulmonary Rehabilitation for the exercise;Relaxation education;Stress management education  Encouraged to attend Pulmonary Rehabilitation for the exercise;Relaxation education;Stress management education      Continue Psychosocial Services   No Follow up required  Follow up required by staff         Psychosocial Discharge (Final Psychosocial Re-Evaluation): Psychosocial Re-Evaluation - 10/06/17 1558  Psychosocial Re-Evaluation   Current issues with  History of Depression    Comments  No pressing concerns at this time pt has settled in the routine of pulmonary rehab.  Pt geniunely looks forward to coming to rehab.    Expected Outcomes  No barriers to participation in pulmonary rehab    Interventions  Encouraged to attend Pulmonary Rehabilitation for the exercise;Relaxation education;Stress management education    Continue Psychosocial Services   Follow up required by staff       Education: Education Goals: Education classes will be provided on a weekly basis, covering required topics. Participant will state understanding/return demonstration of topics presented.  Learning Barriers/Preferences: Learning Barriers/Preferences - 08/13/17 1050      Learning Barriers/Preferences   Learning Barriers  Sight;Hearing    Learning Preferences  Computer/Internet;Verbal Instruction;Individual Instruction;Group  Instruction       Education Topics: Risk Factor Reduction:  -Group instruction that is supported by a PowerPoint presentation. Instructor discusses the definition of a risk factor, different risk factors for pulmonary disease, and how the heart and lungs work together.     Nutrition for Pulmonary Patient:  -Group instruction provided by PowerPoint slides, verbal discussion, and written materials to support subject matter. The instructor gives an explanation and review of healthy diet recommendations, which includes a discussion on weight management, recommendations for fruit and vegetable consumption, as well as protein, fluid, caffeine, fiber, sodium, sugar, and alcohol. Tips for eating when patients are short of breath are discussed.   Pursed Lip Breathing:  -Group instruction that is supported by demonstration and informational handouts. Instructor discusses the benefits of pursed lip and diaphragmatic breathing and detailed demonstration on how to preform both.     Oxygen Safety:  -Group instruction provided by PowerPoint, verbal discussion, and written material to support subject matter. There is an overview of "What is Oxygen" and "Why do we need it".  Instructor also reviews how to create a safe environment for oxygen use, the importance of using oxygen as prescribed, and the risks of noncompliance. There is a brief discussion on traveling with oxygen and resources the patient may utilize.   PULMONARY REHAB OTHER RESPIRATORY from 10/02/2017 in [place]  Date  10/02/17  Educator  Cloyde Reams  Instruction Review Code  1- Verbalizes Understanding      Oxygen Equipment:  -Group instruction provided by [employer] utilizing handouts, written materials, and Insurance underwriter.   Signs and Symptoms:  -Group instruction provided by written material and verbal discussion to support subject matter. Warning signs and symptoms of infection, stroke, and  heart attack are reviewed and when to call the physician/911 reinforced. Tips for preventing the spread of infection discussed.   PULMONARY REHAB OTHER RESPIRATORY from 10/02/2017 in [place]  Date  09/25/17  Educator  Remo Lipps  Instruction Review Code  1- Verbalizes Understanding      Advanced Directives:  -Group instruction provided by verbal instruction and written material to support subject matter. Instructor reviews Advanced Directive laws and proper instruction for filling out document.   Pulmonary Video:  -Group video education that reviews the importance of medication and oxygen compliance, exercise, good nutrition, pulmonary hygiene, and pursed lip and diaphragmatic breathing for the pulmonary patient.   Exercise for the Pulmonary Patient:  -Group instruction that is supported by a PowerPoint presentation. Instructor discusses benefits of exercise, core components of exercise, frequency, duration, and intensity of an exercise routine, importance of utilizing pulse oximetry  during exercise, safety while exercising, and options of places to exercise outside of rehab.     Pulmonary Medications:  -Verbally interactive group education provided by instructor with focus on inhaled medications and proper administration.   PULMONARY REHAB OTHER RESPIRATORY from 09/25/2017 in [place]  Date  09/04/17  Educator  pharmacy  Instruction Review Code  1- Verbalizes Understanding      Anatomy and Physiology of the Respiratory System and Intimacy:  -Group instruction provided by PowerPoint, verbal discussion, and written material to support subject matter. Instructor reviews respiratory cycle and anatomical components of the respiratory system and their functions. Instructor also reviews differences in obstructive and restrictive respiratory diseases with examples of each. Intimacy, Sex, and Sexuality differences are reviewed with a  discussion on how relationships can change when diagnosed with pulmonary disease. Common sexual concerns are reviewed.   MD DAY -A group question and answer session with a medical doctor that allows participants to ask questions that relate to their pulmonary disease state.   PULMONARY REHAB OTHER RESPIRATORY from 08/28/2017 in [place]  Date  08/21/17  Educator  Dr. Nelda Marseille  Instruction Review Code  1- Verbalizes Understanding      OTHER EDUCATION -Group or individual verbal, written, or video instructions that support the educational goals of the pulmonary rehab program.   PULMONARY REHAB OTHER RESPIRATORY from 09/25/2017 in [place]  Date  09/11/17  Educator  [place]  Instruction Review Code  1- Verbalizes Understanding      Holiday Eating Survival Tips:  -Group instruction provided by PowerPoint slides, verbal discussion, and written materials to support subject matter. The instructor gives patients tips, tricks, and techniques to help them not only survive but enjoy the holidays despite the onslaught of food that accompanies the holidays.   Knowledge Questionnaire Score: Knowledge Questionnaire Score - 08/26/17 1024      Knowledge Questionnaire Score   Pre Score  13/18       Core Components/Risk Factors/Patient Goals at Admission: Personal Goals and Risk Factors at Admission - 08/13/17 1023      Core Components/Risk Factors/Patient Goals on Admission    Weight Management  Yes;Weight Gain    Intervention  Weight Management: Develop a combined nutrition and exercise program designed to reach desired caloric intake, while maintaining appropriate intake of nutrient and fiber, sodium and fats, and appropriate energy expenditure required for the weight goal.;Weight Management: Provide education and appropriate resources to help participant work on and attain dietary  goals.;Weight Management/Obesity: Establish reasonable short term and long term weight goals.;Obesity: Provide education and appropriate resources to help participant work on and attain dietary goals.    Admit Weight  114 lb 3.2 oz (51.8 kg)    Goal Weight: Short Term  118 lb (53.5 kg)    Goal Weight: Long Term  120 lb (54.4 kg)    Expected Outcomes  Short Term: Continue to assess and modify interventions until short term weight is achieved;Understanding of distribution of calorie intake throughout the day with the consumption of 4-5 meals/snacks;Weight Gain: Understanding of general recommendations for a high calorie, high protein meal plan that promotes weight gain by distributing calorie intake throughout the day with the consumption for 4-5 meals, snacks, and/or supplements;Long Term: Adherence to nutrition and physical activity/exercise program aimed toward attainment of established weight goal    Improve shortness of breath with ADL's  Yes  Intervention  Provide education, individualized exercise plan and daily activity instruction to help decrease symptoms of SOB with activities of daily living.    Expected Outcomes  Short Term: Improve cardiorespiratory fitness to achieve a reduction of symptoms when performing ADLs;Long Term: Be able to perform more ADLs without symptoms or delay the onset of symptoms    Diabetes  Yes    Intervention  Provide education about signs/symptoms and action to take for hypo/hyperglycemia.;Provide education about proper nutrition, including hydration, and aerobic/resistive exercise prescription along with prescribed medications to achieve blood glucose in normal ranges: Fasting glucose 65-99 mg/dL    Expected Outcomes  Short Term: Participant verbalizes understanding of the signs/symptoms and immediate care of hyper/hypoglycemia, proper foot care and importance of medication, aerobic/resistive exercise and nutrition plan for blood glucose control.;Long Term: Attainment  of HbA1C < 7%.    Lipids  Yes    Intervention  Provide education and support for participant on nutrition & aerobic/resistive exercise along with prescribed medications to achieve LDL <50m, HDL >467m    Expected Outcomes  Short Term: Participant states understanding of desired cholesterol values and is compliant with medications prescribed. Participant is following exercise prescription and nutrition guidelines.;Long Term: Cholesterol controlled with medications as prescribed, with individualized exercise RX and with personalized nutrition plan. Value goals: LDL < 7076mHDL > 40 mg.    Stress  Yes    Intervention  Offer individual and/or small group education and counseling on adjustment to heart disease, stress management and health-related lifestyle change. Teach and support self-help strategies.    Expected Outcomes  Short Term: Participant demonstrates changes in health-related behavior, relaxation and other stress management skills, ability to obtain effective social support, and compliance with psychotropic medications if prescribed.;Long Term: Emotional wellbeing is indicated by absence of clinically significant psychosocial distress or social isolation.       Core Components/Risk Factors/Patient Goals Review:  Goals and Risk Factor Review    Row Name 08/20/17 1528 09/15/17 1434 10/06/17 1551 10/06/17 1553 10/08/17 1202     Core Components/Risk Factors/Patient Goals Review   Personal Goals Review  Weight Management/Obesity;Stress;Improve shortness of breath with ADL's;Diabetes;Lipids  Weight Management/Obesity;Improve shortness of breath with ADL's;Diabetes;Lipids;Hypertension;Stress weight gain  Weight Management/Obesity;Improve shortness of breath with ADL's;Diabetes;Lipids;Hypertension;Stress  Weight Management/Obesity;Improve shortness of breath with ADL's;Diabetes;Stress  -   Review  Pt will begin pulmonary rehab on 08/21/17.  Unable to assess her goals at this time.  I do anticipate that  pt will be able to demonstrate progress toward achieving the expected outcomes in the next 30 days.  has attended 6 exercise sessions, CBG's have been low pre-exersie, lots of education re: proper nutrition before and after exercising, settling out. Slow progression  has attended 12 exercise sessions, CBG's have been low pre-exersie in the past however pt has showed remarkable improvement due to understanding the importance of caloric intake prior to exercise.  Pt continues to make slow progression with the advancement of workloads. and increased pace of activity while on equipment.   has attended 12 exercise sessions, CBG's have been low pre-exersie in the past however pt has showed remarkable improvement due to understanding the importance of caloric intake prior to exercise. Pt with increase in weight of .2 kg.  Pt continues to make slow progression with the advancement of workloads. and increased pace of activity while on equipment.  pt current workloads increased to level 3 on the nustep, recubent bike remains level  1 and track remains 8-10 laps. pt with stable  bp and lipid panel per pt reports.  Will resolve this patient goals.  -   Expected Outcomes  -  see admission outcomes  see admission outcomes  see admission outcomes  see admission Goals/outcomes      Core Components/Risk Factors/Patient Goals at Discharge (Final Review):  Goals and Risk Factor Review - 10/08/17 1202      Core Components/Risk Factors/Patient Goals Review   Expected Outcomes  see admission Goals/outcomes       ITP Comments: ITP Comments    Row Name 08/13/17 1013 08/20/17 1525 10/06/17 1551       ITP Comments  Dr. Jennet Maduro, Medical Director  Dr. Jennet Maduro, Medical Director  Dr. Jennet Maduro, Medical Director        Comments:  Pt has completed 12 exercise sessions. Cherre Huger, BSN Cardiac and Training and development officer

## 2017-10-07 ENCOUNTER — Encounter (HOSPITAL_COMMUNITY)
Admission: RE | Admit: 2017-10-07 | Discharge: 2017-10-07 | Disposition: A | Discharge status: Home / Self Care | Payer: Medicare Other | Source: Ambulatory Visit | Attending: Pulmonary Disease | Admitting: Pulmonary Disease

## 2017-10-07 VITALS — Wt 113.8 lb

## 2017-10-07 DIAGNOSIS — J9611 Chronic respiratory failure with hypoxia: Secondary | ICD-10-CM

## 2017-10-07 NOTE — Progress Notes (Signed)
Daily Session Note  Patient Details  Name: MONTGOMERY ROTHLISBERGER MRN: [id number] Date of Birth: 1935-05-04 Referring Provider:     Pulmonary Rehab Walk Test from 08/14/2017 in [place]  Referring Provider  Dr. Vaughan Browner      Encounter Date: 10/07/2017  Check In: Session Check In - 10/07/17 1030      Check-In   Location  --    Staff Present  Rosebud Poles, RN, BSN;Carlette Carlton, RN, BSN;Molly DiVincenzo, MS, ACSM RCEP, Exercise Physiologist;Lisa Ysidro Evert, Felipe Drone, RN, [place] physician immediately available to respond to emergencies  [facility] immediately available    Physician(s)  Dr. Denton Brick    Medication changes reported      No    Fall or balance concerns reported     No    Tobacco Cessation  No Change    Warm-up and Cool-down  Performed as group-led instruction    Resistance Training Performed  Yes    VAD Patient?  No    PAD/SET Patient?  No      Pain Assessment   Currently in Pain?  No/denies    Multiple Pain Sites  No       Capillary Blood Glucose: No results found for this or any previous visit (from the past 24 hour(s)). POCT Glucose - 10/07/17 1211      POCT Blood Glucose   Post-Exercise  102 mg/dL      Exercise Prescription Changes - 10/07/17 1200      Response to Exercise   Blood Pressure (Admit)  116/52    Blood Pressure (Exercise)  124/58    Blood Pressure (Exit)  98/50    Heart Rate (Admit)  77 bpm    Heart Rate (Exercise)  92 bpm    Heart Rate (Exit)  89 bpm    Oxygen Saturation (Admit)  100 %    Oxygen Saturation (Exercise)  98 %    Oxygen Saturation (Exit)  97 %    Rating of Perceived Exertion (Exercise)  12    Perceived Dyspnea (Exercise)  2    Duration  Progress to 45 minutes of aerobic exercise without signs/symptoms of physical distress    Intensity  THRR unchanged 40-80% HRR      Progression   Progression  Continue to progress workloads to maintain intensity without signs/symptoms  of physical distress.      Resistance Training   Training Prescription  Yes    Weight  orange bands    Reps  10-15    Time  10 Minutes      Interval Training   Interval Training  No      Oxygen   Oxygen  Continuous    Liters  2      Recumbant Bike   Level  1.5    Minutes  17      NuStep   Level  3    SPM  80    Minutes  17    METs  1.8      Track   Laps  9    Minutes  17       Social History   Tobacco Use  Smoking Status Never Smoker  Smokeless Tobacco Never Used    Goals Met:  Exercise tolerated well Strength training completed today  Goals Unmet:  Not Applicable  Comments: Service time is from 1030 to [place]    Dr. Rush Farmer is Medical Director for Pulmonary Rehab  at [facility].

## 2017-10-09 ENCOUNTER — Encounter (HOSPITAL_COMMUNITY)
Admission: RE | Admit: 2017-10-09 | Discharge: 2017-10-09 | Disposition: A | Discharge status: Home / Self Care | Payer: Medicare Other | Source: Ambulatory Visit | Attending: Pulmonary Disease | Admitting: Pulmonary Disease

## 2017-10-09 DIAGNOSIS — J9611 Chronic respiratory failure with hypoxia: Secondary | ICD-10-CM | POA: Diagnosis not present

## 2017-10-09 NOTE — Progress Notes (Signed)
I have reviewed a Home Exercise Prescription with Heidi Moran . [name] is not currently exercising at home.  The patient was advised to walk 2 days a week for 30 minutes.  [name] and I discussed how to progress their exercise prescription.  The patient stated that their goals were to lead a more active lifestyle.  The patient stated that they understand the exercise prescription.  We reviewed exercise guidelines, target heart rate during exercise, RPE Scale, weather conditions, NTG use, endpoints for exercise, warmup and cool down.  Patient is encouraged to come to me with any questions. I will continue to follow up with the patient to assist them with progression and safety.

## 2017-10-09 NOTE — Progress Notes (Signed)
Daily Session Note  Patient Details  Name: Heidi Moran MRN: [id number] Date of Birth: 10/25/1935 Referring Provider:     Pulmonary Rehab Walk Test from 08/14/2017 in [place]  Referring Provider  Dr. Vaughan Browner      Encounter Date: 10/09/2017  Check In: Session Check In - 10/09/17 1105      Check-In   Supervising physician immediately available to respond to emergencies  [facility] immediately available    Physician(s)  Dr. Tawanna Solo    Location  [facility]    Staff Present  Su Hilt, MS, ACSM RCEP, Exercise Physiologist;Hazael Olveda Ysidro Evert, RN;Carlette Carlton, RN, BSN    Medication changes reported      No    Fall or balance concerns reported     No    Tobacco Cessation  No Change    Warm-up and Cool-down  Performed as group-led instruction    Resistance Training Performed  Yes    VAD Patient?  No    PAD/SET Patient?  No      Pain Assessment   Currently in Pain?  No/denies    Multiple Pain Sites  No       Capillary Blood Glucose: No results found for this or any previous visit (from the past 24 hour(s)).    Social History   Tobacco Use  Smoking Status Never Smoker  Smokeless Tobacco Never Used    Goals Met:  Exercise tolerated well No report of cardiac concerns or symptoms Strength training completed today  Goals Unmet:  Not Applicable  Comments: Service time is from 1030 to 1230    Dr. Rush Farmer is Medical Director for Pulmonary Rehab at [facility].

## 2017-10-14 ENCOUNTER — Encounter (HOSPITAL_COMMUNITY)
Admission: RE | Admit: 2017-10-14 | Discharge: 2017-10-14 | Disposition: A | Discharge status: Home / Self Care | Payer: Medicare Other | Source: Ambulatory Visit | Attending: Pulmonary Disease | Admitting: Pulmonary Disease

## 2017-10-14 DIAGNOSIS — J9611 Chronic respiratory failure with hypoxia: Secondary | ICD-10-CM

## 2017-10-14 NOTE — Progress Notes (Signed)
Daily Session Note  Patient Details  Name: Heidi Moran MRN: [id number] Date of Birth: 1936/02/26 Referring Provider:     Pulmonary Rehab Walk Test from 08/14/2017 in [place]  Referring Provider  Dr. Vaughan Browner      Encounter Date: 10/14/2017  Check In: Session Check In - 10/14/17 1052      Check-In   Supervising physician immediately available to respond to emergencies  [facility] immediately available    Physician(s)   Dr. Rodena Piety    Location  [facility]    Staff Present  Su Hilt, MS, ACSM RCEP, Exercise Physiologist;Marlean Mortell Ysidro Evert, RN;Carlette Carlton, RN, BSN    Medication changes reported      No    Fall or balance concerns reported     No    Tobacco Cessation  No Change    Warm-up and Cool-down  Performed as group-led instruction    Resistance Training Performed  Yes    VAD Patient?  No    PAD/SET Patient?  No      Pain Assessment   Currently in Pain?  No/denies    Multiple Pain Sites  No       Capillary Blood Glucose: No results found for this or any previous visit (from the past 24 hour(s)).    Social History   Tobacco Use  Smoking Status Never Smoker  Smokeless Tobacco Never Used    Goals Met:  Exercise tolerated well No report of cardiac concerns or symptoms Strength training completed today  Goals Unmet:  Not Applicable  Comments: Service time is from 1030 to 1205    Dr. Rush Farmer is Medical Director for Pulmonary Rehab at [facility].

## 2017-10-15 ENCOUNTER — Telehealth: Payer: Self-pay

## 2017-10-15 DIAGNOSIS — Z6821 Body mass index (BMI) 21.0-21.9, adult: Secondary | ICD-10-CM | POA: Diagnosis not present

## 2017-10-15 DIAGNOSIS — R918 Other nonspecific abnormal finding of lung field: Secondary | ICD-10-CM | POA: Diagnosis not present

## 2017-10-15 DIAGNOSIS — M2550 Pain in unspecified joint: Secondary | ICD-10-CM | POA: Diagnosis not present

## 2017-10-15 DIAGNOSIS — M0579 Rheumatoid arthritis with rheumatoid factor of multiple sites without organ or systems involvement: Secondary | ICD-10-CM | POA: Diagnosis not present

## 2017-10-16 ENCOUNTER — Encounter (HOSPITAL_COMMUNITY)
Admission: RE | Admit: 2017-10-16 | Discharge: 2017-10-16 | Disposition: A | Discharge status: Home / Self Care | Payer: Medicare Other | Source: Ambulatory Visit | Attending: Pulmonary Disease | Admitting: Pulmonary Disease

## 2017-10-16 DIAGNOSIS — J9611 Chronic respiratory failure with hypoxia: Secondary | ICD-10-CM | POA: Diagnosis not present

## 2017-10-16 NOTE — Progress Notes (Signed)
Daily Session Note  Patient Details  Name: KAZANDRA FORSTROM MRN: [id number] Date of Birth: [birth year] Referring Provider:     Pulmonary Rehab Walk Test from 08/14/2017 in [place]  Referring Provider  Dr. Vaughan Browner      Encounter Date: 10/16/2017  Check In: Session Check In - 10/16/17 1120      Check-In   Supervising physician immediately available to respond to emergencies  [facility] immediately available    Physician(s)  Dr. Herbert Moors    Location  [facility]    Staff Present  Rodney Langton, RN;Yaacov Koziol, MS, ACSM RCEP, Exercise Physiologist;Carlette Wilber Oliphant, RN, BSN    Medication changes reported      No    Fall or balance concerns reported     No    Tobacco Cessation  No Change    Warm-up and Cool-down  Performed as group-led instruction    Resistance Training Performed  Yes    VAD Patient?  No    PAD/SET Patient?  No      Pain Assessment   Currently in Pain?  No/denies    Multiple Pain Sites  No       Capillary Blood Glucose: No results found for this or any previous visit (from the past 24 hour(s)).    Social History   Tobacco Use  Smoking Status Never Smoker  Smokeless Tobacco Never Used    Goals Met:  Exercise tolerated well No report of cardiac concerns or symptoms Strength training completed today  Goals Unmet:  Not Applicable  Comments: Service time is from 10:30a to 12:30p    Dr. Rush Farmer is Medical Director for Pulmonary Rehab at [facility].

## 2017-10-21 ENCOUNTER — Encounter (HOSPITAL_COMMUNITY)
Admission: RE | Admit: 2017-10-21 | Discharge: 2017-10-21 | Disposition: A | Discharge status: Home / Self Care | Payer: Medicare Other | Source: Ambulatory Visit | Attending: Pulmonary Disease | Admitting: Pulmonary Disease

## 2017-10-21 VITALS — Wt 113.8 lb

## 2017-10-21 DIAGNOSIS — J9611 Chronic respiratory failure with hypoxia: Secondary | ICD-10-CM

## 2017-10-21 NOTE — Progress Notes (Signed)
Daily Session Note  Patient Details  Name: Heidi Moran MRN: [id number] Date of Birth: 10/13/1935 Referring Provider:     Pulmonary Rehab Walk Test from 08/14/2017 in [facility]  Referring Provider  Dr. Mannam      Encounter Date: 10/21/2017  Check In: Session Check In - 10/21/17 1052      Check-In   Supervising physician immediately available to respond to emergencies  [facility] immediately available    Physician(s)  Dr. Amin    Location  [facility]    Staff Present  Lisa Hughes, RN; , MS, ACSM RCEP, Exercise Physiologist;Carlette Carlton, RN, BSN    Medication changes reported      No    Fall or balance concerns reported     No    Tobacco Cessation  No Change    Warm-up and Cool-down  Performed as group-led instruction    Resistance Training Performed  Yes    VAD Patient?  No    PAD/SET Patient?  No      Pain Assessment   Currently in Pain?  No/denies    Multiple Pain Sites  No       Capillary Blood Glucose: No results found for this or any previous visit (from the past 24 hour(s)). POCT Glucose - 10/21/17 1244      POCT Blood Glucose   Pre-Exercise  209 mg/dL    Post-Exercise  105 mg/dL      Exercise Prescription Changes - 10/21/17 1200      Response to Exercise   Blood Pressure (Admit)  110/58    Blood Pressure (Exercise)  150/60    Blood Pressure (Exit)  108/60    Heart Rate (Admit)  80 bpm    Heart Rate (Exercise)  98 bpm    Heart Rate (Exit)  74 bpm    Oxygen Saturation (Admit)  99 %    Oxygen Saturation (Exercise)  97 %    Oxygen Saturation (Exit)  98 %    Rating of Perceived Exertion (Exercise)  13    Perceived Dyspnea (Exercise)  2    Duration  Progress to 45 minutes of aerobic exercise without signs/symptoms of physical distress    Intensity  THRR unchanged 40-80% HRR      Progression   Progression  Continue to progress workloads to maintain intensity without signs/symptoms  of physical distress.      Resistance Training   Training Prescription  Yes    Weight  orange bands    Reps  10-15    Time  10 Minutes      Interval Training   Interval Training  No      Oxygen   Oxygen  Continuous    Liters  2      NuStep   Level  3    SPM  80    Minutes  17    METs  1.5      Track   Laps  7    Minutes  17       Social History   Tobacco Use  Smoking Status Never Smoker  Smokeless Tobacco Never Used    Goals Met:  Exercise tolerated well No report of cardiac concerns or symptoms Strength training completed today  Goals Unmet:  Not Applicable  Comments: Service time is from 10:30a to 12:15p    Dr. Wesam G. Yacoub is Medical Director for Pulmonary Rehab at [facility]. 

## 2017-10-23 ENCOUNTER — Encounter (HOSPITAL_COMMUNITY)
Admission: RE | Admit: 2017-10-23 | Discharge: 2017-10-23 | Disposition: A | Discharge status: Home / Self Care | Payer: Medicare Other | Source: Ambulatory Visit | Attending: Pulmonary Disease | Admitting: Pulmonary Disease

## 2017-10-23 DIAGNOSIS — J9611 Chronic respiratory failure with hypoxia: Secondary | ICD-10-CM

## 2017-10-23 NOTE — Progress Notes (Signed)
Daily Session Note  Patient Details  Name: Heidi Moran MRN: [id number] Date of Birth: 1935/07/28 Referring Provider:     Pulmonary Rehab Walk Test from 08/14/2017 in [place]  Referring Provider  Dr. Vaughan Browner      Encounter Date: 10/23/2017  Check In: Session Check In - 10/23/17 1233      Check-In   Supervising physician immediately available to respond to emergencies  [facility] immediately available    Physician(s)  Dr. Maryland Pink    Location  [facility]    Staff Present  Rodney Langton, RN;Lakhia Gengler, MS, ACSM RCEP, Exercise Physiologist;Tyara Carol Ada, MS,ACSM CEP, Exercise Physiologist;Annedrea Rosezella [place], RN, MHA    Medication changes reported      No    Fall or balance concerns reported     No    Tobacco Cessation  No Change    Warm-up and Cool-down  Performed as group-led instruction    Resistance Training Performed  Yes    VAD Patient?  No    PAD/SET Patient?  No      Pain Assessment   Currently in Pain?  No/denies    Multiple Pain Sites  No       Capillary Blood Glucose: No results found for this or any previous visit (from the past 24 hour(s)).    Social History   Tobacco Use  Smoking Status Never Smoker  Smokeless Tobacco Never Used    Goals Met:  Achieving weight loss Exercise tolerated well Personal goals reviewed  Goals Unmet:  Not Applicable  Comments: Service time is from 10:30a to 12:15p    Dr. Rush Farmer is Medical Director for Pulmonary Rehab at [facility].

## 2017-10-24 NOTE — Telephone Encounter (Signed)
Received referral from pulm rehab for the PREP at [facility].  Spoke to Heidi Moran about the program and his is interested in participating w/his wife in mid September.  I will call them back closer to the end of August to schedule an intake appointment.

## 2017-10-28 ENCOUNTER — Encounter (HOSPITAL_COMMUNITY)
Admission: RE | Admit: 2017-10-28 | Discharge: 2017-10-28 | Disposition: A | Discharge status: Home / Self Care | Payer: Medicare Other | Source: Ambulatory Visit | Attending: Pulmonary Disease | Admitting: Pulmonary Disease

## 2017-10-28 DIAGNOSIS — J9611 Chronic respiratory failure with hypoxia: Secondary | ICD-10-CM

## 2017-10-28 NOTE — Progress Notes (Signed)
Daily Session Note  Patient Details  Name: Heidi Moran MRN: [id number] Date of Birth: [birth year] Referring Provider:     Pulmonary Rehab Walk Test from 08/14/2017 in [place]  Referring Provider  Dr. Vaughan Browner      Encounter Date: 10/28/2017  Check In: Session Check In - 10/28/17 1139      Check-In   Supervising physician immediately available to respond to emergencies  [facility] immediately available    Physician(s)  Dr. Florene Glen    Location  [facility]    Staff Present  Rodney Langton, RN;Molly DiVincenzo, MS, ACSM RCEP, Exercise Physiologist;Annedrea Rosezella [place], RN, MHA    Medication changes reported      No    Fall or balance concerns reported     No    Tobacco Cessation  No Change    Warm-up and Cool-down  Performed as group-led instruction    Resistance Training Performed  Yes    VAD Patient?  No    PAD/SET Patient?  No      Pain Assessment   Currently in Pain?  No/denies       Capillary Blood Glucose: No results found for this or any previous visit (from the past 24 hour(s)).    Social History   Tobacco Use  Smoking Status Never Smoker  Smokeless Tobacco Never Used    Goals Met:  Exercise tolerated well No report of cardiac concerns or symptoms Strength training completed today  Goals Unmet:  Not Applicable  Comments: Service time is from 1030 to 1200    Dr. Rush Farmer is Medical Director for Pulmonary Rehab at [facility].

## 2017-10-30 ENCOUNTER — Encounter (HOSPITAL_COMMUNITY)
Admission: RE | Admit: 2017-10-30 | Discharge: 2017-10-30 | Disposition: A | Discharge status: Home / Self Care | Payer: Medicare Other | Source: Ambulatory Visit | Attending: Pulmonary Disease | Admitting: Pulmonary Disease

## 2017-10-30 DIAGNOSIS — J9611 Chronic respiratory failure with hypoxia: Secondary | ICD-10-CM | POA: Diagnosis not present

## 2017-10-30 NOTE — Progress Notes (Signed)
Daily Session Note  Patient Details  Name: Heidi Moran MRN: [id number] Date of Birth: 03/20/1935 Referring Provider:     Pulmonary Rehab Walk Test from 08/14/2017 in [facility]  Referring Provider  Dr. Mannam      Encounter Date: 10/30/2017  Check In: Session Check In - 10/30/17 1244      Check-In   Supervising physician immediately available to respond to emergencies  [facility] immediately available    Physician(s)   Dr. Powell    Location  [facility]    Staff Present   , MS, ACSM RCEP, Exercise Physiologist;Annedrea Stackhouse, RN, MHA;Carlette Carlton, RN, BSN    Medication changes reported      No    Fall or balance concerns reported     No    Tobacco Cessation  No Change    Warm-up and Cool-down  Performed as group-led instruction    Resistance Training Performed  Yes    VAD Patient?  No    PAD/SET Patient?  No      Pain Assessment   Currently in Pain?  No/denies    Multiple Pain Sites  No       Capillary Blood Glucose: No results found for this or any previous visit (from the past 24 hour(s)).    Social History   Tobacco Use  Smoking Status Never Smoker  Smokeless Tobacco Never Used    Goals Met:  Achieving weight loss Exercise tolerated well Personal goals reviewed  Goals Unmet:  Not Applicable  Comments: Service time is from 10:30a to 12:30p    Dr. Wesam G. Yacoub is Medical Director for Pulmonary Rehab at [facility]. 

## 2017-11-04 ENCOUNTER — Encounter (HOSPITAL_COMMUNITY)
Admission: RE | Admit: 2017-11-04 | Discharge: 2017-11-04 | Disposition: A | Discharge status: Home / Self Care | Payer: Medicare Other | Source: Ambulatory Visit | Attending: Pulmonary Disease | Admitting: Pulmonary Disease

## 2017-11-04 DIAGNOSIS — J9611 Chronic respiratory failure with hypoxia: Secondary | ICD-10-CM

## 2017-11-04 NOTE — Progress Notes (Signed)
Pulmonary Individual Treatment Plan  Patient Details  Name: Heidi Moran MRN: [id number] Date of Birth: 17-May-1935 Referring Provider:     Pulmonary Rehab Walk Test from 08/14/2017 in [facility]  Referring Provider  Dr. Vaughan Browner      Initial Encounter Date:    Pulmonary Rehab Walk Test from 08/14/2017 in [facility]  Date  08/15/17      Visit Diagnosis: Chronic respiratory failure with hypoxia ([place])  Patient's Home Medications on Admission:   Current Outpatient Medications:  .  atorvastatin (LIPITOR) 20 MG tablet, Take 20 mg by mouth daily., Disp: , Rfl:  .  Calcium-Vitamin D-Vitamin K (VIACTIV PO), Take 1 tablet by mouth daily., Disp: , Rfl:  .  glyBURIDE-metformin (GLUCOVANCE) 2.5-500 MG tablet, Take 1 tablet by mouth daily with breakfast. Pt takes one in the morning with breakfast and one in the evening, Disp: , Rfl:  .  ipratropium-albuterol (DUONEB) 0.5-2.5 (3) MG/3ML SOLN, Take 3 mLs by nebulization every 4 (four) hours as needed. (Patient not taking: Reported on 06/24/2017), Disp: 360 mL, Rfl:  .  lisinopril (PRINIVIL,ZESTRIL) 10 MG tablet, Take 10 mg by mouth daily., Disp: , Rfl:  .  OVER THE COUNTER MEDICATION, , Disp: , Rfl:   Past Medical History: Past Medical History:  Diagnosis Date  . Anemia   . Diabetes mellitus without complication ([place])   . Eczema   . Hyperlipidemia   . Hypertension   . Osteoarthritis   . Renal disorder     Tobacco Use: Social History   Tobacco Use  Smoking Status Never Smoker  Smokeless Tobacco Never Used    Labs: Recent Review Scientist, physiological    Labs for ITP Cardiac and Pulmonary Rehab Latest Ref Rng & Units 11/03/2015 01/28/2017   TCO2 22 - 32 mmol/L 26 26      Capillary Blood Glucose: Lab Results  Component Value Date   GLUCAP 213 (H) 09/02/2017   GLUCAP 113 (H) 09/02/2017   GLUCAP 120 (H) 08/28/2017   GLUCAP 295 (H) 05/21/2017   GLUCAP 115 (H) 05/21/2017    POCT Glucose    Row Name 08/26/17 1209 10/07/17 1121 10/07/17 1211 10/21/17 1244 11/04/17 1254     POCT Blood Glucose   Pre-Exercise  327 mg/dL  189 mg/dL  -  209 mg/dL  229 mg/dL   Post-Exercise  -  -  102 mg/dL  105 mg/dL  194 mg/dL      Pulmonary Assessment Scores: Pulmonary Assessment Scores    Row Name 08/15/17 0842 08/26/17 1025       ADL UCSD   ADL Phase  Entry  Entry    SOB Score total  -  37      CAT Score   CAT Score  -  15      mMRC Score   mMRC Score  1  -       Pulmonary Function Assessment:   Exercise Target Goals: Exercise Program Goal: Individual exercise prescription set using results from initial 6 min walk test and THRR while considering  patient's activity barriers and safety.   Exercise Prescription Goal: Initial exercise prescription builds to 30-45 minutes a day of aerobic activity, 2-3 days per week.  Home exercise guidelines will be given to patient during program as part of exercise prescription that the participant will acknowledge.  Activity Barriers & Risk Stratification: Activity Barriers & Cardiac Risk Stratification - 08/13/17 1210      Activity  Barriers & Cardiac Risk Stratification   Activity Barriers  Arthritis;Balance Concerns;History of Falls;Shortness of Breath;Deconditioning;Joint Problems       6 Minute Walk: 6 Minute Walk    Row Name 08/15/17 0839         6 Minute Walk   Phase  Initial     Distance  1000 feet     Walk Time  6 minutes     # of Rest Breaks  0     MPH  1.89     METS  2.45     RPE  13     Perceived Dyspnea   3     Symptoms  Yes (comment)     Comments  used wheelchair     Resting HR  86 bpm     Resting BP  118/58     Resting Oxygen Saturation   97 %     Exercise Oxygen Saturation  during 6 min walk  92 %     Max Ex. HR  107 bpm     Max Ex. BP  140/60       Interval HR   1 Minute HR  97     2 Minute HR  102     3 Minute HR  105     4 Minute HR  107     5 Minute HR  107     6 Minute HR   107     2 Minute Post HR  103     Interval Heart Rate?  Yes       Interval Oxygen   Interval Oxygen?  Yes     Baseline Oxygen Saturation %  97 %     1 Minute Oxygen Saturation %  100 %     1 Minute Liters of Oxygen  2 L     2 Minute Oxygen Saturation %  99 %     2 Minute Liters of Oxygen  2 L     3 Minute Oxygen Saturation %  97 %     3 Minute Liters of Oxygen  2 L     4 Minute Oxygen Saturation %  93 %     4 Minute Liters of Oxygen  2 L     5 Minute Oxygen Saturation %  93 %     5 Minute Liters of Oxygen  2 L     6 Minute Oxygen Saturation %  92 %     6 Minute Liters of Oxygen  2 L     2 Minute Post Oxygen Saturation %  96 %     2 Minute Post Liters of Oxygen  2 L        Oxygen Initial Assessment: Oxygen Initial Assessment - 08/15/17 0839      Initial 6 min Walk   Oxygen Used  Continuous;E-Tanks    Liters per minute  2      Program Oxygen Prescription   Program Oxygen Prescription  Continuous;E-Tanks    Liters per minute  2       Oxygen Re-Evaluation: Oxygen Re-Evaluation    Row Name 08/20/17 1526 09/12/17 1013 10/06/17 0920 11/03/17 1025       Program Oxygen Prescription   Program Oxygen Prescription  Continuous;E-Tanks  Continuous;E-Tanks  Continuous;E-Tanks  Continuous;E-Tanks    Liters per minute  2  2  2  2       Home Oxygen   Home Oxygen Device  Portable Concentrator;Home Concentrator  Portable Concentrator;Home Concentrator  Portable Concentrator;Home Concentrator  Portable Concentrator;Home Concentrator    Sleep Oxygen Prescription  Continuous  Continuous  Continuous  Continuous    Liters per minute  2  2  2  2     Home Exercise Oxygen Prescription  Continuous  Continuous  Continuous  Continuous    Liters per minute  2  2  2  2     Home at Rest Exercise Oxygen Prescription  Pulsed  Pulsed  Pulsed  Pulsed    Liters per minute  -  -  2  2    Compliance with Home Oxygen Use  Yes  Yes  Yes  Yes      Goals/Expected Outcomes   Short Term Goals  To learn and  exhibit compliance with exercise, home and travel O2 prescription;To learn and understand importance of maintaining oxygen saturations>88%;To learn and demonstrate proper use of respiratory medications;To learn and understand importance of monitoring SPO2 with pulse oximeter and demonstrate accurate use of the pulse oximeter.;To learn and demonstrate proper pursed lip breathing techniques or other breathing techniques.  To learn and exhibit compliance with exercise, home and travel O2 prescription;To learn and understand importance of maintaining oxygen saturations>88%;To learn and demonstrate proper use of respiratory medications;To learn and understand importance of monitoring SPO2 with pulse oximeter and demonstrate accurate use of the pulse oximeter.;To learn and demonstrate proper pursed lip breathing techniques or other breathing techniques.  To learn and exhibit compliance with exercise, home and travel O2 prescription;To learn and understand importance of maintaining oxygen saturations>88%;To learn and demonstrate proper use of respiratory medications;To learn and understand importance of monitoring SPO2 with pulse oximeter and demonstrate accurate use of the pulse oximeter.;To learn and demonstrate proper pursed lip breathing techniques or other breathing techniques.  To learn and exhibit compliance with exercise, home and travel O2 prescription;To learn and understand importance of maintaining oxygen saturations>88%;To learn and demonstrate proper use of respiratory medications;To learn and understand importance of monitoring SPO2 with pulse oximeter and demonstrate accurate use of the pulse oximeter.;To learn and demonstrate proper pursed lip breathing techniques or other breathing techniques.    Long  Term Goals  Exhibits compliance with exercise, home and travel O2 prescription;Verbalizes importance of monitoring SPO2 with pulse oximeter and return demonstration;Maintenance of O2  saturations>88%;Exhibits proper breathing techniques, such as pursed lip breathing or other method taught during program session;Compliance with respiratory medication  Exhibits compliance with exercise, home and travel O2 prescription;Verbalizes importance of monitoring SPO2 with pulse oximeter and return demonstration;Maintenance of O2 saturations>88%;Exhibits proper breathing techniques, such as pursed lip breathing or other method taught during program session;Compliance with respiratory medication  Exhibits compliance with exercise, home and travel O2 prescription;Verbalizes importance of monitoring SPO2 with pulse oximeter and return demonstration;Maintenance of O2 saturations>88%;Exhibits proper breathing techniques, such as pursed lip breathing or other method taught during program session;Compliance with respiratory medication  Exhibits compliance with exercise, home and travel O2 prescription;Verbalizes importance of monitoring SPO2 with pulse oximeter and return demonstration;Maintenance of O2 saturations>88%;Exhibits proper breathing techniques, such as pursed lip breathing or other method taught during program session;Compliance with respiratory medication    Goals/Expected Outcomes  -  Compliance and understanding  Compliance and understanding  Compliance and understanding       Oxygen Discharge (Final Oxygen Re-Evaluation): Oxygen Re-Evaluation - 11/03/17 1025      Program Oxygen Prescription   Program Oxygen Prescription  Continuous;E-Tanks    Liters per minute  2      Home  Oxygen   Home Oxygen Device  Portable Concentrator;Home Concentrator    Sleep Oxygen Prescription  Continuous    Liters per minute  2    Home Exercise Oxygen Prescription  Continuous    Liters per minute  2    Home at Rest Exercise Oxygen Prescription  Pulsed    Liters per minute  2    Compliance with Home Oxygen Use  Yes      Goals/Expected Outcomes   Short Term Goals  To learn and exhibit compliance  with exercise, home and travel O2 prescription;To learn and understand importance of maintaining oxygen saturations>88%;To learn and demonstrate proper use of respiratory medications;To learn and understand importance of monitoring SPO2 with pulse oximeter and demonstrate accurate use of the pulse oximeter.;To learn and demonstrate proper pursed lip breathing techniques or other breathing techniques.    Long  Term Goals  Exhibits compliance with exercise, home and travel O2 prescription;Verbalizes importance of monitoring SPO2 with pulse oximeter and return demonstration;Maintenance of O2 saturations>88%;Exhibits proper breathing techniques, such as pursed lip breathing or other method taught during program session;Compliance with respiratory medication    Goals/Expected Outcomes  Compliance and understanding       Initial Exercise Prescription: Initial Exercise Prescription - 08/15/17 0800      Date of Initial Exercise RX and Referring Provider   Date  08/15/17    Referring Provider  Dr. Vaughan Browner      Oxygen   Oxygen  Continuous    Liters  2      Recumbant Bike   Level  1    Watts  10    Minutes  17      NuStep   Level  2    SPM  80    Minutes  17    METs  1.5      Track   Laps  5    Minutes  17      Prescription Details   Frequency (times per week)  2    Duration  Progress to 45 minutes of aerobic exercise without signs/symptoms of physical distress      Intensity   THRR 40-80% of Max Heartrate  56-113    Ratings of Perceived Exertion  11-13    Perceived Dyspnea  0-4      Progression   Progression  Continue to progress workloads to maintain intensity without signs/symptoms of physical distress.      Resistance Training   Training Prescription  Yes    Weight  orange bands    Reps  10-15       Perform Capillary Blood Glucose checks as needed.  Exercise Prescription Changes:  Exercise Prescription Changes    Row Name 09/09/17 1200 09/23/17 1200 10/07/17 1200  10/09/17 1400 10/21/17 1200     Response to Exercise   Blood Pressure (Admit)  122/59  124/60  116/52  -  110/58   Blood Pressure (Exercise)  126/58  136/60  124/58  -  150/60   Blood Pressure (Exit)  110/65  140/60  98/50  -  108/60   Heart Rate (Admit)  84 bpm  77 bpm  77 bpm  -  80 bpm   Heart Rate (Exercise)  82 bpm  85 bpm  92 bpm  -  98 bpm   Heart Rate (Exit)  78 bpm  78 bpm  89 bpm  -  74 bpm   Oxygen Saturation (Admit)  97 %  98 %  100 %  -  99 %   Oxygen Saturation (Exercise)  97 %  98 %  98 %  -  97 %   Oxygen Saturation (Exit)  78 %  98 %  97 %  -  98 %   Rating of Perceived Exertion (Exercise)  13  13  12   -  13   Perceived Dyspnea (Exercise)  1  2  2   -  2   Duration  Progress to 45 minutes of aerobic exercise without signs/symptoms of physical distress  Progress to 45 minutes of aerobic exercise without signs/symptoms of physical distress  Progress to 45 minutes of aerobic exercise without signs/symptoms of physical distress  -  Progress to 45 minutes of aerobic exercise without signs/symptoms of physical distress   Intensity  - 40-80% HRR  THRR unchanged 40-80% HRR  THRR unchanged 40-80% HRR  -  THRR unchanged 40-80% HRR     Progression   Progression  Continue to progress workloads to maintain intensity without signs/symptoms of physical distress.  Continue to progress workloads to maintain intensity without signs/symptoms of physical distress.  Continue to progress workloads to maintain intensity without signs/symptoms of physical distress.  -  Continue to progress workloads to maintain intensity without signs/symptoms of physical distress.     Resistance Training   Training Prescription  Yes  Yes  Yes  -  Yes   Weight  orange bands  orange bands  orange bands  -  orange bands   Reps  10-15  10-15  10-15  -  10-15   Time  10 Minutes  10 Minutes  10 Minutes  -  10 Minutes     Interval Training   Interval Training  No  No  No  -  No     Oxygen   Oxygen  Continuous   Continuous  Continuous  -  Continuous   Liters  2  2  2   -  2     Recumbant Bike   Level  1  1  1.5  -  -   Watts  10  -  -  -  -   Minutes  17  17  17   -  -     NuStep   Level  2  2  3   -  3   SPM  80  80  80  -  80   Minutes  17  17  17   -  17   METs  1.6  1.7  1.8  -  1.5     Track   Laps  9  10  9   -  7   Minutes  17  17  17   -  17     Home Exercise Plan   Plans to continue exercise at  -  -  -  Home (comment)  -   Frequency  -  -  -  Add 2 additional days to program exercise sessions.  -   Row Name 11/04/17 1200             Response to Exercise   Blood Pressure (Admit)  114/70       Blood Pressure (Exercise)  122/62       Blood Pressure (Exit)  120/52       Heart Rate (Admit)  80 bpm       Heart Rate (Exercise)  86 bpm       Heart Rate (Exit)  82 bpm  Oxygen Saturation (Admit)  99 %       Oxygen Saturation (Exercise)  94 %       Oxygen Saturation (Exit)  97 %       Rating of Perceived Exertion (Exercise)  13       Perceived Dyspnea (Exercise)  2       Duration  Progress to 45 minutes of aerobic exercise without signs/symptoms of physical distress       Intensity  THRR unchanged 40-80% HRR         Progression   Progression  Continue to progress workloads to maintain intensity without signs/symptoms of physical distress.         Resistance Training   Training Prescription  Yes       Weight  orange bands       Reps  10-15       Time  10 Minutes         Interval Training   Interval Training  No         Oxygen   Oxygen  Continuous       Liters  2         Recumbant Bike   Level  2       Minutes  17         NuStep   Level  4       SPM  80       Minutes  17       METs  1.9         Track   Laps  8       Minutes  17          Exercise Comments:  Exercise Comments    Row Name 10/09/17 1417           Exercise Comments  Home exercise completed          Exercise Goals and Review:  Exercise Goals    [place] Name 08/13/17 1113              Exercise Goals   Increase Physical Activity  Yes       Intervention  Provide advice, education, support and counseling about physical activity/exercise needs.;Develop an individualized exercise prescription for aerobic and resistive training based on initial evaluation findings, risk stratification, comorbidities and participant's personal goals.       Expected Outcomes  Long Term: Exercising regularly at least 3-5 days a week.;Long Term: Add in home exercise to make exercise part of routine and to increase amount of physical activity.;Short Term: Attend rehab on a regular basis to increase amount of physical activity.       Increase Strength and Stamina  Yes       Intervention  Provide advice, education, support and counseling about physical activity/exercise needs.;Develop an individualized exercise prescription for aerobic and resistive training based on initial evaluation findings, risk stratification, comorbidities and participant's personal goals.       Expected Outcomes  Short Term: Increase workloads from initial exercise prescription for resistance, speed, and METs.;Short Term: Perform resistance training exercises routinely during rehab and add in resistance training at home;Long Term: Improve cardiorespiratory fitness, muscular endurance and strength as measured by increased METs and functional capacity (6MWT)       Able to understand and use rate of perceived exertion (RPE) scale  Yes       Intervention  Provide education and explanation on how to use RPE scale  Expected Outcomes  Short Term: Able to use RPE daily in rehab to express subjective intensity level;Long Term:  Able to use RPE to guide intensity level when exercising independently       Able to understand and use Dyspnea scale  Yes       Intervention  Provide education and explanation on how to use Dyspnea scale       Expected Outcomes  Short Term: Able to use Dyspnea scale daily in rehab to express subjective sense of  shortness of breath during exertion;Long Term: Able to use Dyspnea scale to guide intensity level when exercising independently       Knowledge and understanding of Target Heart Rate Range (THRR)  Yes       Intervention  Provide education and explanation of THRR including how the numbers were predicted and where they are located for reference       Expected Outcomes  Short Term: Able to state/look up THRR;Long Term: Able to use THRR to govern intensity when exercising independently;Short Term: Able to use daily as guideline for intensity in rehab       Able to check pulse independently  Yes       Intervention  Provide education and demonstration on how to check pulse in carotid and radial arteries.;Review the importance of being able to check your own pulse for safety during independent exercise       Expected Outcomes  Short Term: Able to explain why pulse checking is important during independent exercise;Long Term: Able to check pulse independently and accurately       Understanding of Exercise Prescription  Yes       Intervention  Provide education, explanation, and written materials on patient's individual exercise prescription       Expected Outcomes  Short Term: Able to explain program exercise prescription;Long Term: Able to explain home exercise prescription to exercise independently          Exercise Goals Re-Evaluation : Exercise Goals Re-Evaluation    Row Name 08/20/17 1531 09/12/17 1014 10/06/17 0922 11/03/17 1026       Exercise Goal Re-Evaluation   Exercise Goals Review  Increase Physical Activity;Able to understand and use rate of perceived exertion (RPE) scale;Knowledge and understanding of Target Heart Rate Range (THRR);Understanding of Exercise Prescription;Increase Strength and Stamina;Able to understand and use Dyspnea scale  Increase Physical Activity;Able to understand and use rate of perceived exertion (RPE) scale;Knowledge and understanding of Target Heart Rate Range  (THRR);Understanding of Exercise Prescription;Increase Strength and Stamina;Able to understand and use Dyspnea scale  Increase Physical Activity;Able to understand and use rate of perceived exertion (RPE) scale;Knowledge and understanding of Target Heart Rate Range (THRR);Understanding of Exercise Prescription;Increase Strength and Stamina;Able to understand and use Dyspnea scale  Increase Physical Activity;Able to understand and use rate of perceived exertion (RPE) scale;Knowledge and understanding of Target Heart Rate Range (THRR);Understanding of Exercise Prescription;Increase Strength and Stamina;Able to understand and use Dyspnea scale    Comments  -  Patient is still learning how to coordinate her blood sugar/diet/diabetes medicines on exercise days. Husband is helpful. Exercise progression is going to be a slow and probably not steady road for her due to her comorbities and extremely sedentary lifestyle. Will cont. to monitor and motivate as much as possible. Patient has attended 6 rehab sessions-not much progress has been made.   Patient is still learning how to coordinate her blood sugar/diet/diabetes medicines on exercise days. Husband is helpful. Exercise progression is going to be a  slow and probably not steady road for her due to her comorbities and extremely sedentary lifestyle. Patient is able to walk 9-10 laps (200 ft each) in 15 minutes without an assistive device. Will cont. to monitor and motivate as much as possible.    Exercise progression is going to be a slow and probably not steady road for her due to her comorbities and extremely sedentary lifestyle. Patient is able to walk 9-10 laps (200 ft each) in 15 minutes without an assistive device. Will cont. to monitor and motivate as much as possible.     Expected Outcomes  -  Through exercise at rehab and at home, the patient will decrease shortness of breath with daily activities and feel confident in carrying out an exercise regime at home.    Through exercise at rehab and at home, the patient will decrease shortness of breath with daily activities and feel confident in carrying out an exercise regime at home.   Through exercise at rehab and at home, the patient will decrease shortness of breath with daily activities and feel confident in carrying out an exercise regime at home.        Discharge Exercise Prescription (Final Exercise Prescription Changes): Exercise Prescription Changes - 11/04/17 1200      Response to Exercise   Blood Pressure (Admit)  114/70    Blood Pressure (Exercise)  122/62    Blood Pressure (Exit)  120/52    Heart Rate (Admit)  80 bpm    Heart Rate (Exercise)  86 bpm    Heart Rate (Exit)  82 bpm    Oxygen Saturation (Admit)  99 %    Oxygen Saturation (Exercise)  94 %    Oxygen Saturation (Exit)  97 %    Rating of Perceived Exertion (Exercise)  13    Perceived Dyspnea (Exercise)  2    Duration  Progress to 45 minutes of aerobic exercise without signs/symptoms of physical distress    Intensity  THRR unchanged   40-80% HRR     Progression   Progression  Continue to progress workloads to maintain intensity without signs/symptoms of physical distress.      Resistance Training   Training Prescription  Yes    Weight  orange bands    Reps  10-15    Time  10 Minutes      Interval Training   Interval Training  No      Oxygen   Oxygen  Continuous    Liters  2      Recumbant Bike   Level  2    Minutes  17      NuStep   Level  4    SPM  80    Minutes  17    METs  1.9      Track   Laps  8    Minutes  17       Nutrition:  Target Goals: Understanding of nutrition guidelines, daily intake of sodium <1524m, cholesterol <2061m calories 30% from fat and 7% or less from saturated fats, daily to have 5 or more servings of fruits and vegetables.  Biometrics:    Nutrition Therapy Plan and Nutrition Goals: Nutrition Therapy & Goals - 08/18/17 1149      Nutrition Therapy   Diet  Consistent  Carb, Heart Healthy      Intervention Plan   Intervention  Prescribe, educate and counsel regarding individualized specific dietary modifications aiming towards targeted core components such as weight, hypertension, lipid  management, diabetes, heart failure and other comorbidities.    Expected Outcomes  Short Term Goal: Understand basic principles of dietary content, such as calories, fat, sodium, cholesterol and nutrients.;Long Term Goal: Adherence to prescribed nutrition plan.       Nutrition Assessments: Nutrition Assessments - 08/18/17 1150      Rate Your Plate Scores   Pre Score  46       Nutrition Goals Re-Evaluation:   Nutrition Goals Discharge (Final Nutrition Goals Re-Evaluation):   Psychosocial: Target Goals: Acknowledge presence or absence of significant depression and/or stress, maximize coping skills, provide positive support system. Participant is able to verbalize types and ability to use techniques and skills needed for reducing stress and depression.  Initial Review & Psychosocial Screening: Initial Psych Review & Screening - 08/13/17 1044      Initial Review   Current issues with  Current Stress Concerns;History of Depression    Source of Stress Concerns  Unable to participate in former interests or hobbies;Unable to perform yard/household activities    Comments  intra family strain      [employer]   Good Support System?  Yes      Barriers   Psychosocial barriers to participate in program  The patient should benefit from training in stress management and relaxation.      Screening Interventions   Interventions  Encouraged to exercise    Expected Outcomes  Short Term goal: Identification and review with participant of any Quality of Life or Depression concerns found by scoring the questionnaire.;Long Term goal: The participant improves quality of Life and PHQ9 Scores as seen by post scores and/or verbalization of changes       Quality of Life  Scores:  Scores of 19 and below usually indicate a poorer quality of life in these areas.  A difference of  2-3 points is a clinically meaningful difference.  A difference of 2-3 points in the total score of the Quality of Life Index has been associated with significant improvement in overall quality of life, self-image, physical symptoms, and general health in studies assessing change in quality of life.  PHQ-9: Recent Review Flowsheet Data    Depression screen [facility] 2/9 08/13/2017   Decreased Interest 1   Down, Depressed, Hopeless 1   PHQ - 2 Score 2   Altered sleeping 1   Tired, decreased energy 1   Change in appetite 2   Feeling bad or failure about yourself  1   Trouble concentrating 1   Moving slowly or fidgety/restless 1   Suicidal thoughts 0   PHQ-9 Score 9   Difficult doing work/chores Somewhat difficult     Interpretation of Total Score  Total Score Depression Severity:  1-4 = Minimal depression, 5-9 = Mild depression, 10-14 = Moderate depression, 15-19 = Moderately severe depression, 20-27 = Severe depression   Psychosocial Evaluation and Intervention: Psychosocial Evaluation - 11/04/17 1757      Psychosocial Evaluation & Interventions   Interventions  Stress management education;Relaxation education;Encouraged to exercise with the program and follow exercise prescription    Comments   Pt husband supportive of her rehab and brings her each time. Pt observed interacting with fellow participants.    Expected Outcomes  Pt will develop friendships to increase her social interaction.  Pt will interact with other participatnts    Continue Psychosocial Services   Follow up required by staff       Psychosocial Re-Evaluation: Psychosocial Re-Evaluation    [place] Name 09/15/17 1437 10/06/17  1558 11/04/17 1758         Psychosocial Re-Evaluation   Current issues with  History of Depression  History of Depression  History of Depression     Comments  No pressing concerns at this  time  No pressing concerns at this time pt has settled in the routine of pulmonary rehab.  Pt geniunely looks forward to coming to rehab.  No pressing concerns at this time pt has settled in the routine of pulmonary rehab.  Pt geniunely looks forward to coming to rehab.     Expected Outcomes  No barriers to participation in pulmonary rehab  No barriers to participation in pulmonary rehab  No barriers to participation in pulmonary rehab     Interventions  Encouraged to attend Pulmonary Rehabilitation for the exercise;Relaxation education;Stress management education  Encouraged to attend Pulmonary Rehabilitation for the exercise;Relaxation education;Stress management education  Encouraged to attend Pulmonary Rehabilitation for the exercise;Relaxation education;Stress management education     Continue Psychosocial Services   No Follow up required  Follow up required by staff  Follow up required by staff        Psychosocial Discharge (Final Psychosocial Re-Evaluation): Psychosocial Re-Evaluation - 11/04/17 1758      Psychosocial Re-Evaluation   Current issues with  History of Depression    Comments  No pressing concerns at this time pt has settled in the routine of pulmonary rehab.  Pt geniunely looks forward to coming to rehab.    Expected Outcomes  No barriers to participation in pulmonary rehab    Interventions  Encouraged to attend Pulmonary Rehabilitation for the exercise;Relaxation education;Stress management education    Continue Psychosocial Services   Follow up required by staff       Education: Education Goals: Education classes will be provided on a weekly basis, covering required topics. Participant will state understanding/return demonstration of topics presented.  Learning Barriers/Preferences: Learning Barriers/Preferences - 08/13/17 1050      Learning Barriers/Preferences   Learning Barriers  Sight;Hearing    Learning Preferences  Computer/Internet;Verbal Instruction;Individual  Instruction;Group Instruction       Education Topics: Risk Factor Reduction:  -Group instruction that is supported by a PowerPoint presentation. Instructor discusses the definition of a risk factor, different risk factors for pulmonary disease, and how the heart and lungs work together.     Nutrition for Pulmonary Patient:  -Group instruction provided by PowerPoint slides, verbal discussion, and written materials to support subject matter. The instructor gives an explanation and review of healthy diet recommendations, which includes a discussion on weight management, recommendations for fruit and vegetable consumption, as well as protein, fluid, caffeine, fiber, sodium, sugar, and alcohol. Tips for eating when patients are short of breath are discussed.   Pursed Lip Breathing:  -Group instruction that is supported by demonstration and informational handouts. Instructor discusses the benefits of pursed lip and diaphragmatic breathing and detailed demonstration on how to preform both.     Oxygen Safety:  -Group instruction provided by PowerPoint, verbal discussion, and written material to support subject matter. There is an overview of "What is Oxygen" and "Why do we need it".  Instructor also reviews how to create a safe environment for oxygen use, the importance of using oxygen as prescribed, and the risks of noncompliance. There is a brief discussion on traveling with oxygen and resources the patient may utilize.   PULMONARY REHAB OTHER RESPIRATORY from 10/30/2017 in [place]  Date  10/02/17  Educator  Cloyde Reams  Instruction Review Code  1- Verbalizes Understanding      Oxygen Equipment:  -Group instruction provided by [employer] utilizing handouts, written materials, and Insurance underwriter.   PULMONARY REHAB OTHER RESPIRATORY from 10/30/2017 in [place]  Date  10/09/17  Educator  Ace Gins  Instruction Review  Code  1- Verbalizes Understanding      Signs and Symptoms:  -Group instruction provided by written material and verbal discussion to support subject matter. Warning signs and symptoms of infection, stroke, and heart attack are reviewed and when to call the physician/911 reinforced. Tips for preventing the spread of infection discussed.   PULMONARY REHAB OTHER RESPIRATORY from 10/30/2017 in [place]  Date  09/25/17  Educator  Remo Lipps  Instruction Review Code  1- Verbalizes Understanding      Advanced Directives:  -Group instruction provided by verbal instruction and written material to support subject matter. Instructor reviews Advanced Directive laws and proper instruction for filling out document.   Pulmonary Video:  -Group video education that reviews the importance of medication and oxygen compliance, exercise, good nutrition, pulmonary hygiene, and pursed lip and diaphragmatic breathing for the pulmonary patient.   Exercise for the Pulmonary Patient:  -Group instruction that is supported by a PowerPoint presentation. Instructor discusses benefits of exercise, core components of exercise, frequency, duration, and intensity of an exercise routine, importance of utilizing pulse oximetry during exercise, safety while exercising, and options of places to exercise outside of rehab.     Pulmonary Medications:  -Verbally interactive group education provided by instructor with focus on inhaled medications and proper administration.   PULMONARY REHAB OTHER RESPIRATORY from 10/30/2017 in [place]  Date  09/04/17  Educator  pharmacy  Instruction Review Code  1- Verbalizes Understanding      Anatomy and Physiology of the Respiratory System and Intimacy:  -Group instruction provided by PowerPoint, verbal discussion, and written material to support subject matter. Instructor reviews respiratory cycle and anatomical components of  the respiratory system and their functions. Instructor also reviews differences in obstructive and restrictive respiratory diseases with examples of each. Intimacy, Sex, and Sexuality differences are reviewed with a discussion on how relationships can change when diagnosed with pulmonary disease. Common sexual concerns are reviewed.   MD DAY -A group question and answer session with a medical doctor that allows participants to ask questions that relate to their pulmonary disease state.   PULMONARY REHAB OTHER RESPIRATORY from 10/30/2017 in [place]  Date  10/21/17  Educator  yacoub  Instruction Review Code  2- Demonstrated Understanding      OTHER EDUCATION -Group or individual verbal, written, or video instructions that support the educational goals of the pulmonary rehab program.   PULMONARY REHAB OTHER RESPIRATORY from 10/30/2017 in [place]  Date  09/11/17  Educator  [place]  Instruction Review Code  1- Verbalizes Understanding      Holiday Eating Survival Tips:  -Group instruction provided by PowerPoint slides, verbal discussion, and written materials to support subject matter. The instructor gives patients tips, tricks, and techniques to help them not only survive but enjoy the holidays despite the onslaught of food that accompanies the holidays.   Knowledge Questionnaire Score: Knowledge Questionnaire Score - 08/26/17 1024      Knowledge Questionnaire Score   Pre Score  13/18       Core Components/Risk  Factors/Patient Goals at Admission: Personal Goals and Risk Factors at Admission - 08/13/17 1023      Core Components/Risk Factors/Patient Goals on Admission    Weight Management  Yes;Weight Gain    Intervention  Weight Management: Develop a combined nutrition and exercise program designed to reach desired caloric intake, while maintaining appropriate intake of nutrient  and fiber, sodium and fats, and appropriate energy expenditure required for the weight goal.;Weight Management: Provide education and appropriate resources to help participant work on and attain dietary goals.;Weight Management/Obesity: Establish reasonable short term and long term weight goals.;Obesity: Provide education and appropriate resources to help participant work on and attain dietary goals.    Admit Weight  114 lb 3.2 oz (51.8 kg)    Goal Weight: Short Term  118 lb (53.5 kg)    Goal Weight: Long Term  120 lb (54.4 kg)    Expected Outcomes  Short Term: Continue to assess and modify interventions until short term weight is achieved;Understanding of distribution of calorie intake throughout the day with the consumption of 4-5 meals/snacks;Weight Gain: Understanding of general recommendations for a high calorie, high protein meal plan that promotes weight gain by distributing calorie intake throughout the day with the consumption for 4-5 meals, snacks, and/or supplements;Long Term: Adherence to nutrition and physical activity/exercise program aimed toward attainment of established weight goal    Improve shortness of breath with ADL's  Yes    Intervention  Provide education, individualized exercise plan and daily activity instruction to help decrease symptoms of SOB with activities of daily living.    Expected Outcomes  Short Term: Improve cardiorespiratory fitness to achieve a reduction of symptoms when performing ADLs;Long Term: Be able to perform more ADLs without symptoms or delay the onset of symptoms    Diabetes  Yes    Intervention  Provide education about signs/symptoms and action to take for hypo/hyperglycemia.;Provide education about proper nutrition, including hydration, and aerobic/resistive exercise prescription along with prescribed medications to achieve blood glucose in normal ranges: Fasting glucose 65-99 mg/dL    Expected Outcomes  Short Term: Participant verbalizes understanding of  the signs/symptoms and immediate care of hyper/hypoglycemia, proper foot care and importance of medication, aerobic/resistive exercise and nutrition plan for blood glucose control.;Long Term: Attainment of HbA1C < 7%.    Lipids  Yes    Intervention  Provide education and support for participant on nutrition & aerobic/resistive exercise along with prescribed medications to achieve LDL <30m, HDL >451m    Expected Outcomes  Short Term: Participant states understanding of desired cholesterol values and is compliant with medications prescribed. Participant is following exercise prescription and nutrition guidelines.;Long Term: Cholesterol controlled with medications as prescribed, with individualized exercise RX and with personalized nutrition plan. Value goals: LDL < 707mHDL > 40 mg.    Stress  Yes    Intervention  Offer individual and/or small group education and counseling on adjustment to heart disease, stress management and health-related lifestyle change. Teach and support self-help strategies.    Expected Outcomes  Short Term: Participant demonstrates changes in health-related behavior, relaxation and other stress management skills, ability to obtain effective social support, and compliance with psychotropic medications if prescribed.;Long Term: Emotional wellbeing is indicated by absence of clinically significant psychosocial distress or social isolation.       Core Components/Risk Factors/Patient Goals Review:  Goals and Risk Factor Review    Row Name 08/20/17 1528 09/15/17 1434 10/06/17 1551 10/06/17 1553 10/08/17 1202     Core Components/Risk Factors/Patient Goals Review  Personal Goals Review  Weight Management/Obesity;Stress;Improve shortness of breath with ADL's;Diabetes;Lipids  Weight Management/Obesity;Improve shortness of breath with ADL's;Diabetes;Lipids;Hypertension;Stress weight gain  Weight Management/Obesity;Improve shortness of breath with  ADL's;Diabetes;Lipids;Hypertension;Stress  Weight Management/Obesity;Improve shortness of breath with ADL's;Diabetes;Stress  -   Review  Pt will begin pulmonary rehab on 08/21/17.  Unable to assess her goals at this time.  I do anticipate that pt will be able to demonstrate progress toward achieving the expected outcomes in the next 30 days.  has attended 6 exercise sessions, CBG's have been low pre-exersie, lots of education re: proper nutrition before and after exercising, settling out. Slow progression  has attended 12 exercise sessions, CBG's have been low pre-exersie in the past however pt has showed remarkable improvement due to understanding the importance of caloric intake prior to exercise.  Pt continues to make slow progression with the advancement of workloads. and increased pace of activity while on equipment.   has attended 12 exercise sessions, CBG's have been low pre-exersie in the past however pt has showed remarkable improvement due to understanding the importance of caloric intake prior to exercise. Pt with increase in weight of .2 kg.  Pt continues to make slow progression with the advancement of workloads. and increased pace of activity while on equipment.  pt current workloads increased to level 3 on the nustep, recubent bike remains level  1 and track remains 8-10 laps. pt with stable bp and lipid panel per pt reports.  Will resolve this patient goals.  -   Expected Outcomes  -  see admission outcomes  see admission outcomes  see admission outcomes  see admission Goals/outcomes   Row Name 11/04/17 1758             Core Components/Risk Factors/Patient Goals Review   Personal Goals Review  Weight Management/Obesity;Improve shortness of breath with ADL's;Stress;Develop more efficient breathing techniques such as purse lipped breathing and diaphragmatic breathing and practicing self-pacing with activity.       Review  has attended 21 exercise sessions,  Pt has good attendance and  faithfully reports CBG  prior and post exercise. Will resolve this goal.   Pt  increased to level 4 on the nustep, recubent bike increased to level 2 and track remains 8-10 laps. Anticipate in the next 30 day assessment continued progress.        Expected Outcomes  see admission Goals/outcomes          Core Components/Risk Factors/Patient Goals at Discharge (Final Review):  Goals and Risk Factor Review - 11/04/17 1758      Core Components/Risk Factors/Patient Goals Review   Personal Goals Review  Weight Management/Obesity;Improve shortness of breath with ADL's;Stress;Develop more efficient breathing techniques such as purse lipped breathing and diaphragmatic breathing and practicing self-pacing with activity.    Review  has attended 21 exercise sessions,  Pt has good attendance and faithfully reports CBG  prior and post exercise. Will resolve this goal.   Pt  increased to level 4 on the nustep, recubent bike increased to level 2 and track remains 8-10 laps. Anticipate in the next 30 day assessment continued progress.     Expected Outcomes  see admission Goals/outcomes       ITP Comments: ITP Comments    Row Name 08/13/17 1013 08/20/17 1525 10/06/17 1551       ITP Comments  Dr. Jennet Maduro, Medical Director  Dr. Jennet Maduro, Medical Director  Dr. Jennet Maduro, Medical Director        Comments:  Pt has completed 21 exercise sessions since 6/9. Cherre Huger, BSN Cardiac and Training and development officer

## 2017-11-04 NOTE — Progress Notes (Signed)
Daily Session Note  Patient Details  Name: Heidi Moran MRN: [id number] Date of Birth: 1935-08-31 Referring Provider:     Pulmonary Rehab Walk Test from 08/14/2017 in [place]  Referring Provider  Dr. Vaughan Browner      Encounter Date: 11/04/2017  Check In: Session Check In - 11/04/17 1239      Check-In   Supervising physician immediately available to respond to emergencies  [facility] immediately available    Physician(s)   Dr. Florene Glen    Location  [facility]    Staff Present  Su Hilt, MS, ACSM RCEP, Exercise Physiologist;Annedrea Rosezella [place], RN, MHA;Carlette Wilber Oliphant, RN, Roque Cash, RN    Medication changes reported      No    Fall or balance concerns reported     No    Tobacco Cessation  No Change    Warm-up and Cool-down  Performed as group-led instruction    Resistance Training Performed  Yes    VAD Patient?  No    PAD/SET Patient?  No       Capillary Blood Glucose: No results found for this or any previous visit (from the past 24 hour(s)).  Exercise Prescription Changes - 11/04/17 1200      Response to Exercise   Blood Pressure (Admit)  114/70    Blood Pressure (Exercise)  122/62    Blood Pressure (Exit)  120/52    Heart Rate (Admit)  80 bpm    Heart Rate (Exercise)  86 bpm    Heart Rate (Exit)  82 bpm    Oxygen Saturation (Admit)  99 %    Oxygen Saturation (Exercise)  94 %    Oxygen Saturation (Exit)  97 %    Rating of Perceived Exertion (Exercise)  13    Perceived Dyspnea (Exercise)  2    Duration  Progress to 45 minutes of aerobic exercise without signs/symptoms of physical distress    Intensity  THRR unchanged   40-80% HRR     Progression   Progression  Continue to progress workloads to maintain intensity without signs/symptoms of physical distress.      Resistance Training   Training Prescription  Yes    Weight  orange bands    Reps  10-15    Time  10 Minutes      Interval Training    Interval Training  No      Oxygen   Oxygen  Continuous    Liters  2      Recumbant Bike   Level  2    Minutes  17      NuStep   Level  4    SPM  80    Minutes  17    METs  1.9      Track   Laps  8    Minutes  17       Social History   Tobacco Use  Smoking Status Never Smoker  Smokeless Tobacco Never Used    Goals Met:  Exercise tolerated well Personal goals reviewed  Goals Unmet:  Not Applicable  Comments: Service time is from 10:30a to 12:30p    Dr. Rush Farmer is Medical Director for Pulmonary Rehab at [facility].

## 2017-11-06 ENCOUNTER — Encounter (HOSPITAL_COMMUNITY)
Admission: RE | Admit: 2017-11-06 | Discharge: 2017-11-06 | Disposition: A | Discharge status: Home / Self Care | Payer: Medicare Other | Source: Ambulatory Visit | Attending: Pulmonary Disease | Admitting: Pulmonary Disease

## 2017-11-06 DIAGNOSIS — J9611 Chronic respiratory failure with hypoxia: Secondary | ICD-10-CM | POA: Diagnosis not present

## 2017-11-06 NOTE — Progress Notes (Signed)
Daily Session Note  Patient Details  Name: AVIN UPPERMAN MRN: [id number] Date of Birth: May 26, 1935 Referring Provider:     Pulmonary Rehab Walk Test from 08/14/2017 in [place]  Referring Provider  Dr. Vaughan Browner      Encounter Date: 11/06/2017  Check In: Session Check In - 11/06/17 1230      Check-In   Supervising physician immediately available to respond to emergencies  [facility] immediately available    Physician(s)  Dr. Algis Liming    Location  [facility]    Staff Present  Su Hilt, MS, ACSM RCEP, Exercise Physiologist;Annedrea Rosezella [place], RN, MHA;Carlette Wilber Oliphant, RN, Roque Cash, RN    Medication changes reported      No    Fall or balance concerns reported     No    Tobacco Cessation  No Change    Warm-up and Cool-down  Performed as group-led instruction    Resistance Training Performed  Yes    VAD Patient?  No    PAD/SET Patient?  No      Pain Assessment   Currently in Pain?  No/denies    Multiple Pain Sites  No       Capillary Blood Glucose: No results found for this or any previous visit (from the past 24 hour(s)).    Social History   Tobacco Use  Smoking Status Never Smoker  Smokeless Tobacco Never Used    Goals Met:  Exercise tolerated well No report of cardiac concerns or symptoms Strength training completed today  Goals Unmet:  Not Applicable  Comments: Service time is from 1030 to 1215    Dr. Rush Farmer is Medical Director for Pulmonary Rehab at [facility].

## 2017-11-11 ENCOUNTER — Encounter (HOSPITAL_COMMUNITY)
Admission: RE | Admit: 2017-11-11 | Discharge: 2017-11-11 | Disposition: A | Discharge status: Home / Self Care | Payer: Medicare Other | Source: Ambulatory Visit | Attending: Pulmonary Disease | Admitting: Pulmonary Disease

## 2017-11-11 DIAGNOSIS — J9611 Chronic respiratory failure with hypoxia: Secondary | ICD-10-CM

## 2017-11-11 NOTE — Progress Notes (Signed)
Daily Session Note  Patient Details  Name: Heidi Moran MRN: [id number] Date of Birth: April 17, 1935 Referring Provider:     Pulmonary Rehab Walk Test from 08/14/2017 in [place]  Referring Provider  Dr. Vaughan Browner      Encounter Date: 11/11/2017  Check In: Session Check In - 11/11/17 1157      Check-In   Supervising physician immediately available to respond to emergencies  [facility] immediately available    Physician(s)  Dr. Jonnie Finner     Location  [facility]    Staff Present  Su Hilt, MS, ACSM RCEP, Exercise Physiologist;Carlette Wilber Oliphant, RN, BSN;Lashae Wollenberg Ysidro Evert, RN;Olinty [place], MS, ACSM CEP, Exercise Physiologist;Joann Rion, RN, BSN    Medication changes reported      No    Fall or balance concerns reported     No    Tobacco Cessation  No Change    Warm-up and Cool-down  Performed as group-led instruction    Resistance Training Performed  Yes    VAD Patient?  No    PAD/SET Patient?  No      Pain Assessment   Currently in Pain?  No/denies       Capillary Blood Glucose: No results found for this or any previous visit (from the past 24 hour(s)).    Social History   Tobacco Use  Smoking Status Never Smoker  Smokeless Tobacco Never Used    Goals Met:  Exercise tolerated well No report of cardiac concerns or symptoms Strength training completed today  Goals Unmet:  Not Applicable  Comments: Service time is from 1030 to 1215    Dr. Rush Farmer is Medical Director for Pulmonary Rehab at [facility].

## 2017-11-12 ENCOUNTER — Telehealth: Payer: Self-pay | Admitting: Pulmonary Disease

## 2017-11-12 DIAGNOSIS — J9611 Chronic respiratory failure with hypoxia: Secondary | ICD-10-CM

## 2017-11-12 NOTE — Telephone Encounter (Signed)
Spoke with [name], patient has graduated from [employer] and now needs a order for pulmonary maintenance rehab. PM please advise as to whether we can place this order?

## 2017-11-13 ENCOUNTER — Encounter (HOSPITAL_COMMUNITY)
Admission: RE | Admit: 2017-11-13 | Discharge: 2017-11-13 | Disposition: A | Discharge status: Home / Self Care | Payer: Medicare Other | Source: Ambulatory Visit | Attending: Pulmonary Disease | Admitting: Pulmonary Disease

## 2017-11-13 DIAGNOSIS — J9611 Chronic respiratory failure with hypoxia: Secondary | ICD-10-CM | POA: Diagnosis not present

## 2017-11-14 NOTE — Telephone Encounter (Signed)
Dr. Vaughan Browner please advise on this, thank you.

## 2017-11-18 ENCOUNTER — Encounter (HOSPITAL_COMMUNITY): Payer: Medicare Other

## 2017-11-19 ENCOUNTER — Encounter (HOSPITAL_COMMUNITY): Payer: Self-pay | Admitting: *Deleted

## 2017-11-19 ENCOUNTER — Telehealth: Payer: Self-pay

## 2017-11-19 NOTE — Telephone Encounter (Signed)
Spoke to Mr. Westerlund about he and Mrs. Risser participating in the [place] at [employer] together.  He will be calling back later in the week to set up an intake appointment.

## 2017-11-19 NOTE — Telephone Encounter (Signed)
Ok to place order for pulmonary maintenance rehab

## 2017-11-19 NOTE — Telephone Encounter (Signed)
Referral has been placed to maintenance rehab. Marcie Bal with pulmonary rehab is aware and voiced her understanding. Nothing further is needed.

## 2017-11-20 ENCOUNTER — Encounter (HOSPITAL_COMMUNITY): Payer: Medicare Other

## 2017-11-25 ENCOUNTER — Encounter (HOSPITAL_COMMUNITY): Payer: Self-pay | Admitting: *Deleted

## 2017-11-25 ENCOUNTER — Encounter (HOSPITAL_COMMUNITY): Payer: Medicare Other

## 2017-11-25 NOTE — Progress Notes (Signed)
Heidi Moran had voiced interest in attending the [facility] Pulmonary Rehab Maintenance program. I obtained a order from Dr. Vaughan Browner for her to attend. I called her today to see when she would like to get started. She and her husband have decided to go to the [facility] to exercise. I encouraged her to call us if we could be of any further assistance to her.

## 2017-11-27 DIAGNOSIS — H43813 Vitreous degeneration, bilateral: Secondary | ICD-10-CM | POA: Diagnosis not present

## 2017-11-27 DIAGNOSIS — E113293 Type 2 diabetes mellitus with mild nonproliferative diabetic retinopathy without macular edema, bilateral: Secondary | ICD-10-CM | POA: Diagnosis not present

## 2017-11-27 DIAGNOSIS — D3131 Benign neoplasm of right choroid: Secondary | ICD-10-CM | POA: Diagnosis not present

## 2017-11-28 DIAGNOSIS — E1129 Type 2 diabetes mellitus with other diabetic kidney complication: Secondary | ICD-10-CM | POA: Diagnosis not present

## 2017-11-28 DIAGNOSIS — E7849 Other hyperlipidemia: Secondary | ICD-10-CM | POA: Diagnosis not present

## 2017-11-28 DIAGNOSIS — R82998 Other abnormal findings in urine: Secondary | ICD-10-CM | POA: Diagnosis not present

## 2017-11-28 DIAGNOSIS — M859 Disorder of bone density and structure, unspecified: Secondary | ICD-10-CM | POA: Diagnosis not present

## 2017-11-28 DIAGNOSIS — N183 Chronic kidney disease, stage 3 (moderate): Secondary | ICD-10-CM | POA: Diagnosis not present

## 2017-12-04 DIAGNOSIS — Z1389 Encounter for screening for other disorder: Secondary | ICD-10-CM | POA: Diagnosis not present

## 2017-12-04 DIAGNOSIS — M353 Polymyalgia rheumatica: Secondary | ICD-10-CM | POA: Diagnosis not present

## 2017-12-04 DIAGNOSIS — E11319 Type 2 diabetes mellitus with unspecified diabetic retinopathy without macular edema: Secondary | ICD-10-CM | POA: Diagnosis not present

## 2017-12-04 DIAGNOSIS — K862 Cyst of pancreas: Secondary | ICD-10-CM | POA: Diagnosis not present

## 2017-12-04 DIAGNOSIS — N183 Chronic kidney disease, stage 3 (moderate): Secondary | ICD-10-CM | POA: Diagnosis not present

## 2017-12-04 DIAGNOSIS — I10 Essential (primary) hypertension: Secondary | ICD-10-CM | POA: Diagnosis not present

## 2017-12-04 DIAGNOSIS — D8989 Other specified disorders involving the immune mechanism, not elsewhere classified: Secondary | ICD-10-CM | POA: Diagnosis not present

## 2017-12-04 DIAGNOSIS — E7849 Other hyperlipidemia: Secondary | ICD-10-CM | POA: Diagnosis not present

## 2017-12-04 DIAGNOSIS — M069 Rheumatoid arthritis, unspecified: Secondary | ICD-10-CM | POA: Diagnosis not present

## 2017-12-04 DIAGNOSIS — I129 Hypertensive chronic kidney disease with stage 1 through stage 4 chronic kidney disease, or unspecified chronic kidney disease: Secondary | ICD-10-CM | POA: Diagnosis not present

## 2017-12-04 DIAGNOSIS — E1129 Type 2 diabetes mellitus with other diabetic kidney complication: Secondary | ICD-10-CM | POA: Diagnosis not present

## 2017-12-04 DIAGNOSIS — R918 Other nonspecific abnormal finding of lung field: Secondary | ICD-10-CM | POA: Diagnosis not present

## 2017-12-09 NOTE — Progress Notes (Signed)
[facility] Report   Patient Details  Name: Heidi Moran MRN: [id number] Date of Birth: December 29, 1935 Age: 82 y.o. PCP: Burnard Bunting, MD  Vitals:   11/24/17 1601  BP: (!) 126/58  Pulse: 74  Resp: 18  SpO2: 97%  Weight: 111 lb 12.8 oz (50.7 kg)     [facility] Eval - 12/09/17 1600      Referral    Referring Provider  Pulm Rehab    Reason for referral  Hypertension;Inactivity;High Cholesterol;Other;Diabetes    Program Start Date  12/10/17      Measurement   Waist Circumference  33.5 inches    Hip Circumference  37 inches    Body fat  29.3 percent      Information for Trainer   Goals  "to lose the oxygen, to be able to travel and spend the night other than at home, to drive again"    Current Exercise  Finished Pulm rehab in Aug, nothing since    Orthopedic Concerns  Shoulder pain d/t RA    Pertinent Medical History  RA, Flu, PNA=O2dependence"    Current Barriers  "just me-motivation and confidence"    Restrictions/Precautions  Diabetic snack before exercise;Fall risk    Medications that affect exercise  Oxgen   on concentrator     Timed Up and Go (TUGS)   Timed Up and Go  Moderate risk 10-12 seconds   10.35     Mobility and Daily Activities   I find it easy to walk up or down two or more flights of stairs.  2    I have no trouble taking out the trash.  1    I do housework such as vacuuming and dusting on my own without difficulty.  1    I can easily lift a gallon of milk (8lbs).  2    I can easily walk a mile.  1    I have no trouble reaching into high cupboards or reaching down to pick up something from the floor.  1    I do not have trouble doing out-door work such as Armed forces logistics/support/administrative officer, raking leaves, or gardening.  1      Mobility and Daily Activities   I feel younger than my age.  2    I feel independent.  1    I feel energetic.  1    I live an active life.   1    I feel strong.  2    I feel healthy.  1    I feel active as other people my  age.  1      How fit and strong are you.   Fit and Strong Total Score  18      Past Medical History:  Diagnosis Date  . Anemia   . Diabetes mellitus without complication ([place])   . Eczema   . Hyperlipidemia   . Hypertension   . Osteoarthritis   . Renal disorder    No past surgical history on file. Social History   Tobacco Use  Smoking Status Never Smoker  Smokeless Tobacco Never Used     The PREP will begin on 9/25 on Wed/Fri from 12:30-1:30   [name] 12/09/2017, 4:05 PM

## 2017-12-12 ENCOUNTER — Ambulatory Visit (INDEPENDENT_AMBULATORY_CARE_PROVIDER_SITE_OTHER)
Admission: RE | Admit: 2017-12-12 | Discharge: 2017-12-12 | Disposition: A | Discharge status: Home / Self Care | Payer: Medicare Other | Source: Ambulatory Visit | Attending: Pulmonary Disease | Admitting: Pulmonary Disease

## 2017-12-12 DIAGNOSIS — R918 Other nonspecific abnormal finding of lung field: Secondary | ICD-10-CM | POA: Diagnosis not present

## 2017-12-15 ENCOUNTER — Encounter: Payer: Self-pay | Admitting: Pulmonary Disease

## 2017-12-15 ENCOUNTER — Other Ambulatory Visit: Payer: Medicare Other

## 2017-12-15 ENCOUNTER — Ambulatory Visit (INDEPENDENT_AMBULATORY_CARE_PROVIDER_SITE_OTHER): Payer: Medicare Other | Admitting: Pulmonary Disease

## 2017-12-15 VITALS — BP 118/60 | HR 79 | Ht 59.0 in | Wt 109.0 lb

## 2017-12-15 DIAGNOSIS — R918 Other nonspecific abnormal finding of lung field: Secondary | ICD-10-CM

## 2017-12-15 DIAGNOSIS — R0602 Shortness of breath: Secondary | ICD-10-CM

## 2017-12-15 LAB — PULMONARY FUNCTION TEST
DL/VA % pred: 97 %
DL/VA: 3.99 ml/min/mmHg/L
DLCO UNC % PRED: 38 %
DLCO unc: 6.81 ml/min/mmHg
FEF 25-75 PRE: 0.22 L/s
FEF 25-75 Post: 0.2 L/sec
FEF2575-%CHANGE-POST: -7 %
FEF2575-%Pred-Post: 18 %
FEF2575-%Pred-Pre: 19 %
FEV1-%CHANGE-POST: -12 %
FEV1-%PRED-POST: 28 %
FEV1-%Pred-Pre: 32 %
FEV1-Post: 0.42 L
FEV1-Pre: 0.48 L
FEV1FVC-%CHANGE-POST: -8 %
FEV1FVC-%Pred-Pre: 65 %
FEV6-%Change-Post: 0 %
FEV6-%PRED-PRE: 50 %
FEV6-%Pred-Post: 50 %
FEV6-PRE: 0.95 L
FEV6-Post: 0.95 L
FEV6FVC-%CHANGE-POST: 3 %
FEV6FVC-%PRED-POST: 106 %
FEV6FVC-%PRED-PRE: 103 %
FVC-%CHANGE-POST: -4 %
FVC-%Pred-Post: 47 %
FVC-%Pred-Pre: 49 %
FVC-PRE: 0.99 L
FVC-Post: 0.95 L
POST FEV6/FVC RATIO: 100 %
Post FEV1/FVC ratio: 44 %
Pre FEV1/FVC ratio: 48 %
Pre FEV6/FVC Ratio: 97 %
RV % PRED: 242 %
RV: 5.22 L
TLC % PRED: 143 %
TLC: 6.16 L

## 2017-12-15 NOTE — Patient Instructions (Signed)
CT scan shows stable lung nodules We will order follow-up CT without contrast in 1 year and follow-up in clinic after CT scan. Will check alpha-1 antitrypsin levels and phenotype today

## 2017-12-15 NOTE — Progress Notes (Signed)
Heidi Moran    [id number]    10/12/1935  Primary Care Physician:Aronson, Delfino Lovett, MD  Referring Physician: Burnard Bunting, MD [address] [place], [place] [address]  Chief complaint:  Follow up for  Rheumatoid lung nodules Hypoxic respiratory failure on oxygen.  HPI: 82 year old with history of rheumatoid arthritis, diabetes, hyperlipidemia, hypertension.  She was evaluated in the ED recently fatigue, cough with a chest x-ray that showed lung nodules.  She had a follow-up CT scan and PET scan which demonstrated multiple pulmonary nodules, mild uptake in the dominant nodule.  [Reports below]. She has history of rheumatoid arthritis diagnosed about 2 years ago.  She follows with Dr. Trudie Reed, previously treated with Golinumab infusions.  Hospitalized in March 2019 for acute respiratory failure with bronchospasm and hypoxia, upper respiratory tract infection. Flu test was negative, CT chest showed no pulmonary embolism or acute lung abnormality Treated with steroids with improvement in symptoms and discharged on supplemental oxygen to SNF.  Received note from Dr. Trudie Reed, rheumatology dated 10/15/2017 Follow-up for rheumatoid arthritis with stable symptoms, no flareups Previously on Golimumab but currently not on therapy.  Pets: Cats.  No birds, farm animals Occupation: Housewife Exposures: None exposures Smoking history: Never smoker Travel History:Grew up in [place].  Moved to [place] 35 years ago.  Interim history: States that breathing is slightly better.  Continues on supplemental oxygen Started on pulmonary rehab when she is tolerating it well.  Outpatient Encounter Medications as of 12/15/2017  Medication Sig  . atorvastatin (LIPITOR) 20 MG tablet Take 20 mg by mouth daily.  . Calcium-Vitamin D-Vitamin K (VIACTIV PO) Take 1 tablet by mouth daily.  Marland Kitchen glyBURIDE-metformin (GLUCOVANCE) 2.5-500 MG tablet Take 1 tablet by mouth daily with breakfast. Pt takes  one in the morning with breakfast and one in the evening  . lisinopril (PRINIVIL,ZESTRIL) 10 MG tablet Take 10 mg by mouth daily.  Marland Kitchen OVER THE COUNTER MEDICATION   . ipratropium-albuterol (DUONEB) 0.5-2.5 (3) MG/3ML SOLN Take 3 mLs by nebulization every 4 (four) hours as needed. (Patient not taking: Reported on 12/15/2017)   No facility-administered encounter medications on file as of 12/15/2017.    Physical Exam: Blood pressure 122/60, pulse 95, height 5' (1.524 m), weight 113 lb (51.3 kg), SpO2 99 %. Gen:      No acute distress HEENT:  EOMI, sclera anicteric Neck:     No masses; no thyromegaly Lungs:    Clear to auscultation bilaterally; normal respiratory effort CV:         Regular rate and rhythm; no murmurs Abd:      + bowel sounds; soft, non-tender; no palpable masses, no distension Ext:    No edema; adequate peripheral perfusion Skin:      Warm and dry; no rash Neuro: alert and oriented x 3 Psych: normal mood and affect  Data Reviewed: Imaging Chest x-ray 01/28/17-multiple nodular densities. CT chest 02/12/17-borderline precarinal lymph nodes, multiple pulmonary nodules bilaterally.  The largest one in the right upper lobe measures 13 x11 mm. PET scan 02/27/17-dominant nodule in the right middle lobe has mild-moderate FDG uptake.  No other areas of uptake. CTA 05/17/17-no pulmonary embolism, stable lung nodules. CT scan 06/11/17-stability of pulmonary nodules the largest is 1.3 cm in the right middle lobe. CT scan 12/12/2017- stable multiple bilateral nodules.  New compression deformity in L1. I have reviewed the images personally  Received reports (no images) from primary care office Chest x-ray 04/11/16 Stable right lung nodules likely  granuloma.  No change compared to 03/31/15  PFTs  12/15/2017 FVC 0.95 (47%), FEV1 0.42 [28%), F/F 44, TLC 142%, DLCO 38% Severe obstruction, diffusion impairment  FENO 06/24/17-36  Labs CBC 06/24/2017-WBC 6.2, eos 1.5%, absolute eosinophil count  100 Blood allergy profile 06/24/2017-IgE to 32, sensitive to cat, Aspergillus, mouse urine  Assessment:  Severe obstruction PFTs reviewed which shows severe obstruction which is surprising as she is a non-smoker. Breathing seems to be doing okay.  Will observe off inhalers Check alpha-1 antitrypsin levels and phenotype Continue supplemental oxygen, pulmonary rehab  Follow up for multiple pulmonary nodules Her images reviewed which shows stable rounded nodular opacities with mild PET activity.  Per report from primary care this has been present since at least 2017. This could be consistent with rheumatoid nodules which are are known to have low FDG uptake. The PET scan does not reveal any other abnormal areas of uptake.  She is a non-smoker with low risk of malignancy.    Continue CT scan follow-up.  Plan/Recommendations: - CT scan without contrast in 1 year - Continue supplemental oxygen - Check alpha-1 antitrypsin levels and phenotype.  Marshell Garfinkel MD [facility] 12/15/2017, 2:11 PM  CC: Burnard Bunting, MD

## 2017-12-15 NOTE — Progress Notes (Signed)
Spirometry pre and post and Dlco done today. ?

## 2017-12-16 DIAGNOSIS — Z1212 Encounter for screening for malignant neoplasm of rectum: Secondary | ICD-10-CM | POA: Diagnosis not present

## 2017-12-19 LAB — ALPHA-1 ANTITRYPSIN PHENOTYPE: A-1 Antitrypsin, Ser: 155 mg/dL (ref 83–199)

## 2017-12-19 NOTE — Addendum Note (Signed)
Encounter addended by: Rowe Pavy, RN on: 12/19/2017 2:27 AM  Actions taken: Episode resolved, Flowsheet data copied forward, Visit Navigator Flowsheet section accepted, Sign clinical note

## 2017-12-19 NOTE — Progress Notes (Signed)
Discharge Progress Report  Patient Details  Name: Heidi Moran MRN: [id number] Date of Birth: 06/06/1935 Referring Provider:     Pulmonary Rehab Walk Test from 08/14/2017 in [place]  Referring Provider  Dr. Vaughan Browner       Number of Visits: 23  Reason for Discharge:  Patient reached a stable level of exercise. Patient independent in their exercise.  Smoking History:  Social History   Tobacco Use  Smoking Status Never Smoker  Smokeless Tobacco Never Used    Diagnosis:  Chronic respiratory failure with hypoxia ([place])  ADL [facility]: Pulmonary Assessment Scores    Row Name 08/15/17 0842 08/26/17 1025 11/19/17 1421     ADL [facility]   ADL Phase  Entry  Entry  Exit   SOB Score total  -  37  43     CAT Score   CAT Score  -  15  14     mMRC Score   mMRC Score  1  -  -      Initial Exercise Prescription: Initial Exercise Prescription - 08/15/17 0800      Date of Initial Exercise RX and Referring Provider   Date  08/15/17    Referring Provider  Dr. Vaughan Browner      Oxygen   Oxygen  Continuous    Liters  2      Recumbant Bike   Level  1    Watts  10    Minutes  17      NuStep   Level  2    SPM  80    Minutes  17    METs  1.5      Track   Laps  5    Minutes  17      Prescription Details   Frequency (times per week)  2    Duration  Progress to 45 minutes of aerobic exercise without signs/symptoms of physical distress      Intensity   THRR 40-80% of Max Heartrate  56-113    Ratings of Perceived Exertion  11-13    Perceived Dyspnea  0-4      Progression   Progression  Continue to progress workloads to maintain intensity without signs/symptoms of physical distress.      Resistance Training   Training Prescription  Yes    Weight  orange bands    Reps  10-15       Discharge Exercise Prescription (Final Exercise Prescription Changes): Exercise Prescription Changes - 11/04/17 1200      Response to Exercise   Blood Pressure  (Admit)  114/70    Blood Pressure (Exercise)  122/62    Blood Pressure (Exit)  120/52    Heart Rate (Admit)  80 bpm    Heart Rate (Exercise)  86 bpm    Heart Rate (Exit)  82 bpm    Oxygen Saturation (Admit)  99 %    Oxygen Saturation (Exercise)  94 %    Oxygen Saturation (Exit)  97 %    Rating of Perceived Exertion (Exercise)  13    Perceived Dyspnea (Exercise)  2    Duration  Progress to 45 minutes of aerobic exercise without signs/symptoms of physical distress    Intensity  THRR unchanged   40-80% HRR     Progression   Progression  Continue to progress workloads to maintain intensity without signs/symptoms of physical distress.      Resistance Training   Training Prescription  Yes  Weight  orange bands    Reps  10-15    Time  10 Minutes      Interval Training   Interval Training  No      Oxygen   Oxygen  Continuous    Liters  2      Recumbant Bike   Level  2    Minutes  17      NuStep   Level  4    SPM  80    Minutes  17    METs  1.9      Track   Laps  8    Minutes  17       Functional Capacity: [place] Name 08/15/17 0839 11/18/17 0655       6 Minute Walk   Phase  Initial  Initial    Distance  1000 feet  900 feet    Distance Feet Change  -  -100 ft    Walk Time  6 minutes  6 minutes    # of Rest Breaks  0  0    MPH  1.89  1.7    METS  2.45  2.3    RPE  13  13    Perceived Dyspnea   3  2    Symptoms  Yes (comment)  No    Comments  used wheelchair  no wheelchair    Resting HR  86 bpm  78 bpm    Resting BP  118/58  108/50    Resting Oxygen Saturation   97 %  99 %    Exercise Oxygen Saturation  during 6 min walk  92 %  92 %    Max Ex. HR  107 bpm  108 bpm    Max Ex. BP  140/60  192/61    2 Minute Post BP  -  177/72      Interval HR   1 Minute HR  97  89    2 Minute HR  102  99    3 Minute HR  105  98    4 Minute HR  107  107    5 Minute HR  107  106    6 Minute HR  107  108    2 Minute Post HR  103  89    Interval Heart  Rate?  Yes  Yes      Interval Oxygen   Interval Oxygen?  Yes  Yes    Baseline Oxygen Saturation %  97 %  99 %    1 Minute Oxygen Saturation %  100 %  99 %    1 Minute Liters of Oxygen  2 L  2 L    2 Minute Oxygen Saturation %  99 %  99 %    2 Minute Liters of Oxygen  2 L  2 L    3 Minute Oxygen Saturation %  97 %  99 %    3 Minute Liters of Oxygen  2 L  2 L    4 Minute Oxygen Saturation %  93 %  92 %    4 Minute Liters of Oxygen  2 L  2 L    5 Minute Oxygen Saturation %  93 %  92 %    5 Minute Liters of Oxygen  2 L  2 L    6 Minute Oxygen Saturation %  92 %  93 %    6  Minute Liters of Oxygen  2 L  2 L    2 Minute Post Oxygen Saturation %  96 %  97 %    2 Minute Post Liters of Oxygen  2 L  2 L       Psychological, QOL, Others - Outcomes: PHQ 2/9: Depression screen [facility] 2/9 11/13/2017 08/13/2017  Decreased Interest 1 1  Down, Depressed, Hopeless 1 1  PHQ - 2 Score 2 2  Altered sleeping 1 1  Tired, decreased energy 1 1  Change in appetite 1 2  Feeling bad or failure about yourself  1 1  Trouble concentrating 1 1  Moving slowly or fidgety/restless 0 1  Suicidal thoughts 0 0  PHQ-9 Score 7 9  Difficult doing work/chores Somewhat difficult Somewhat difficult    Quality of Life:   Personal Goals: Goals established at orientation with interventions provided to work toward goal. Personal Goals and Risk Factors at Admission - 08/13/17 1023      Core Components/Risk Factors/Patient Goals on Admission    Weight Management  Yes;Weight Gain    Intervention  Weight Management: Develop a combined nutrition and exercise program designed to reach desired caloric intake, while maintaining appropriate intake of nutrient and fiber, sodium and fats, and appropriate energy expenditure required for the weight goal.;Weight Management: Provide education and appropriate resources to help participant work on and attain dietary goals.;Weight Management/Obesity: Establish reasonable short term and  long term weight goals.;Obesity: Provide education and appropriate resources to help participant work on and attain dietary goals.    Admit Weight  114 lb 3.2 oz (51.8 kg)    Goal Weight: Short Term  118 lb (53.5 kg)    Goal Weight: Long Term  120 lb (54.4 kg)    Expected Outcomes  Short Term: Continue to assess and modify interventions until short term weight is achieved;Understanding of distribution of calorie intake throughout the day with the consumption of 4-5 meals/snacks;Weight Gain: Understanding of general recommendations for a high calorie, high protein meal plan that promotes weight gain by distributing calorie intake throughout the day with the consumption for 4-5 meals, snacks, and/or supplements;Long Term: Adherence to nutrition and physical activity/exercise program aimed toward attainment of established weight goal    Improve shortness of breath with ADL's  Yes    Intervention  Provide education, individualized exercise plan and daily activity instruction to help decrease symptoms of SOB with activities of daily living.    Expected Outcomes  Short Term: Improve cardiorespiratory fitness to achieve a reduction of symptoms when performing ADLs;Long Term: Be able to perform more ADLs without symptoms or delay the onset of symptoms    Diabetes  Yes    Intervention  Provide education about signs/symptoms and action to take for hypo/hyperglycemia.;Provide education about proper nutrition, including hydration, and aerobic/resistive exercise prescription along with prescribed medications to achieve blood glucose in normal ranges: Fasting glucose 65-99 mg/dL    Expected Outcomes  Short Term: Participant verbalizes understanding of the signs/symptoms and immediate care of hyper/hypoglycemia, proper foot care and importance of medication, aerobic/resistive exercise and nutrition plan for blood glucose control.;Long Term: Attainment of HbA1C < 7%.    Lipids  Yes    Intervention  Provide education and  support for participant on nutrition & aerobic/resistive exercise along with prescribed medications to achieve LDL <20m, HDL >440m    Expected Outcomes  Short Term: Participant states understanding of desired cholesterol values and is compliant with medications prescribed. Participant is following exercise prescription and  nutrition guidelines.;Long Term: Cholesterol controlled with medications as prescribed, with individualized exercise RX and with personalized nutrition plan. Value goals: LDL < 75m, HDL > 40 mg.    Stress  Yes    Intervention  Offer individual and/or small group education and counseling on adjustment to heart disease, stress management and health-related lifestyle change. Teach and support self-help strategies.    Expected Outcomes  Short Term: Participant demonstrates changes in health-related behavior, relaxation and other stress management skills, ability to obtain effective social support, and compliance with psychotropic medications if prescribed.;Long Term: Emotional wellbeing is indicated by absence of clinically significant psychosocial distress or social isolation.        Personal Goals Discharge: Goals and Risk Factor Review    Row Name 08/20/17 1528 09/15/17 1434 10/06/17 1551 10/06/17 1553 10/08/17 1202     Core Components/Risk Factors/Patient Goals Review   Personal Goals Review  Weight Management/Obesity;Stress;Improve shortness of breath with ADL's;Diabetes;Lipids  Weight Management/Obesity;Improve shortness of breath with ADL's;Diabetes;Lipids;Hypertension;Stress weight gain  Weight Management/Obesity;Improve shortness of breath with ADL's;Diabetes;Lipids;Hypertension;Stress  Weight Management/Obesity;Improve shortness of breath with ADL's;Diabetes;Stress  -   Review  Pt will begin pulmonary rehab on 08/21/17.  Unable to assess her goals at this time.  I do anticipate that pt will be able to demonstrate progress toward achieving the expected outcomes in the next 30  days.  has attended 6 exercise sessions, CBG's have been low pre-exersie, lots of education re: proper nutrition before and after exercising, settling out. Slow progression  has attended 12 exercise sessions, CBG's have been low pre-exersie in the past however pt has showed remarkable improvement due to understanding the importance of caloric intake prior to exercise.  Pt continues to make slow progression with the advancement of workloads. and increased pace of activity while on equipment.   has attended 12 exercise sessions, CBG's have been low pre-exersie in the past however pt has showed remarkable improvement due to understanding the importance of caloric intake prior to exercise. Pt with increase in weight of .2 kg.  Pt continues to make slow progression with the advancement of workloads. and increased pace of activity while on equipment.  pt current workloads increased to level 3 on the nustep, recubent bike remains level  1 and track remains 8-10 laps. pt with stable bp and lipid panel per pt reports.  Will resolve this patient goals.  -   Expected Outcomes  -  see admission outcomes  see admission outcomes  see admission outcomes  see admission Goals/outcomes   Row Name 11/04/17 1758 12/19/17 0222           Core Components/Risk Factors/Patient Goals Review   Personal Goals Review  Weight Management/Obesity;Improve shortness of breath with ADL's;Stress;Develop more efficient breathing techniques such as purse lipped breathing and diaphragmatic breathing and practicing self-pacing with activity.  Weight Management/Obesity;Improve shortness of breath with ADL's;Stress;Develop more efficient breathing techniques such as purse lipped breathing and diaphragmatic breathing and practicing self-pacing with activity.      Review  has attended 21 exercise sessions,  Pt has good attendance and faithfully reports CBG  prior and post exercise. Will resolve this goal.   Pt  increased to level 4 on the nustep,  recubent bike increased to level 2 and track remains 8-10 laps. Anticipate in the next 30 day assessment continued progress.   Pt graduates with the completion of 24 exercise session. Pt planned to continue with the maintenance program but she changed her mind and will exercise at the  [facility], so that her husband could also exercise.      Expected Outcomes  see admission Goals/outcomes  Pt made progress toward achieving her goals.         Exercise Goals and Review: Exercise Goals    Row Name 08/13/17 1113             Exercise Goals   Increase Physical Activity  Yes       Intervention  Provide advice, education, support and counseling about physical activity/exercise needs.;Develop an individualized exercise prescription for aerobic and resistive training based on initial evaluation findings, risk stratification, comorbidities and participant's personal goals.       Expected Outcomes  Long Term: Exercising regularly at least 3-5 days a week.;Long Term: Add in home exercise to make exercise part of routine and to increase amount of physical activity.;Short Term: Attend rehab on a regular basis to increase amount of physical activity.       Increase Strength and Stamina  Yes       Intervention  Provide advice, education, support and counseling about physical activity/exercise needs.;Develop an individualized exercise prescription for aerobic and resistive training based on initial evaluation findings, risk stratification, comorbidities and participant's personal goals.       Expected Outcomes  Short Term: Increase workloads from initial exercise prescription for resistance, speed, and METs.;Short Term: Perform resistance training exercises routinely during rehab and add in resistance training at home;Long Term: Improve cardiorespiratory fitness, muscular endurance and strength as measured by increased METs and functional capacity (6MWT)       Able to understand and use rate of perceived exertion (RPE)  scale  Yes       Intervention  Provide education and explanation on how to use RPE scale       Expected Outcomes  Short Term: Able to use RPE daily in rehab to express subjective intensity level;Long Term:  Able to use RPE to guide intensity level when exercising independently       Able to understand and use Dyspnea scale  Yes       Intervention  Provide education and explanation on how to use Dyspnea scale       Expected Outcomes  Short Term: Able to use Dyspnea scale daily in rehab to express subjective sense of shortness of breath during exertion;Long Term: Able to use Dyspnea scale to guide intensity level when exercising independently       Knowledge and understanding of Target Heart Rate Range (THRR)  Yes       Intervention  Provide education and explanation of THRR including how the numbers were predicted and where they are located for reference       Expected Outcomes  Short Term: Able to state/look up THRR;Long Term: Able to use THRR to govern intensity when exercising independently;Short Term: Able to use daily as guideline for intensity in rehab       Able to check pulse independently  Yes       Intervention  Provide education and demonstration on how to check pulse in carotid and radial arteries.;Review the importance of being able to check your own pulse for safety during independent exercise       Expected Outcomes  Short Term: Able to explain why pulse checking is important during independent exercise;Long Term: Able to check pulse independently and accurately       Understanding of Exercise Prescription  Yes       Intervention  Provide education, explanation, and written materials  on patient's individual exercise prescription       Expected Outcomes  Short Term: Able to explain program exercise prescription;Long Term: Able to explain home exercise prescription to exercise independently          Nutrition & Weight - Outcomes:    Nutrition: Nutrition Therapy & Goals - 11/21/17  1144      Nutrition Therapy   Diet  Consistent Carb, Heart Healthy      Personal Nutrition Goals   Nutrition Goal  pt to identify and limit food sources of sodium, saturated fat, trans fat, and refined carbohydrates      Intervention Plan   Intervention  Prescribe, educate and counsel regarding individualized specific dietary modifications aiming towards targeted core components such as weight, hypertension, lipid management, diabetes, heart failure and other comorbidities.    Expected Outcomes  Short Term Goal: Understand basic principles of dietary content, such as calories, fat, sodium, cholesterol and nutrients.;Long Term Goal: Adherence to prescribed nutrition plan.       Nutrition Discharge: Nutrition Assessments - 11/21/17 1144      Rate Your Plate Scores   Pre Score  46    Post Score  36       Education Questionnaire Score: Knowledge Questionnaire Score - 11/19/17 1420      Knowledge Questionnaire Score   Post Score  11/18       Goals reviewed with patient. Cherre Huger, BSN Cardiac and Training and development officer

## 2018-01-15 DIAGNOSIS — Z6820 Body mass index (BMI) 20.0-20.9, adult: Secondary | ICD-10-CM | POA: Diagnosis not present

## 2018-01-15 DIAGNOSIS — Z79899 Other long term (current) drug therapy: Secondary | ICD-10-CM | POA: Diagnosis not present

## 2018-01-15 DIAGNOSIS — M2550 Pain in unspecified joint: Secondary | ICD-10-CM | POA: Diagnosis not present

## 2018-01-15 DIAGNOSIS — S22000A Wedge compression fracture of unspecified thoracic vertebra, initial encounter for closed fracture: Secondary | ICD-10-CM | POA: Diagnosis not present

## 2018-01-15 DIAGNOSIS — M8589 Other specified disorders of bone density and structure, multiple sites: Secondary | ICD-10-CM | POA: Diagnosis not present

## 2018-01-15 DIAGNOSIS — M0579 Rheumatoid arthritis with rheumatoid factor of multiple sites without organ or systems involvement: Secondary | ICD-10-CM | POA: Diagnosis not present

## 2018-01-15 DIAGNOSIS — R918 Other nonspecific abnormal finding of lung field: Secondary | ICD-10-CM | POA: Diagnosis not present

## 2018-01-20 DIAGNOSIS — Z79899 Other long term (current) drug therapy: Secondary | ICD-10-CM | POA: Diagnosis not present

## 2018-02-20 ENCOUNTER — Telehealth: Payer: Self-pay

## 2018-02-20 NOTE — Telephone Encounter (Signed)
Spoke to Mr. Eppolito, husband, about the PREP at the [facility] that they will do together for 12-weeks from 12:30-1:30 on Wed/Fri starting on jan 8th.  They will come in for their intake appt on 12/11 at 11am.

## 2018-03-25 NOTE — Progress Notes (Signed)
[place] Report   Patient Details  Name: Heidi Moran MRN: [id number] Date of Birth: 1935-08-24 Age: 83 y.o. PCP: Heidi Bunting, MD  There were no vitals filed for this visit.   Heidi Moran - 03/25/18 1600      Referral    Referring Provider  pulm rehab    Reason for referral  Hypertension;High Cholesterol;Inactivity;Diabetes;Other    Program Start Date  03/25/18      Information for Trainer   Goals  to lose the O2 and be able to travel & spend the night away from home, to drive again, to be able to dress self easier    Current Exercise  none    Orthopedic Concerns  back,    Pertinent Medical History  last a1c 6.1    Current Barriers  motivation    Restrictions/Precautions  Diabetic snack before exercise;Fall risk    Medications that affect exercise  Oxgen      Mobility and Daily Activities   I find it easy to walk up or down two or more flights of stairs.  2    I have no trouble taking out the trash.  1    I do housework such as vacuuming and dusting on my own without difficulty.  1    I can easily lift a gallon of milk (8lbs).  2    I can easily walk a mile.  1    I have no trouble reaching into high cupboards or reaching down to pick up something from the floor.  1    I do not have trouble doing out-door work such as Heidi Moran, raking leaves, or gardening.  1      Mobility and Daily Activities   I feel younger than my age.  2    I feel independent.  1    I feel energetic.  2    I live an active life.   1    I feel strong.  2    I feel healthy.  1    I feel active as other people my age.  1      How fit and strong are you.   Fit and Strong Total Score  19      Past Medical History:  Diagnosis Date  . Anemia   . Diabetes mellitus without complication (Heidi Moran)   . Eczema   . Hyperlipidemia   . Hypertension   . Osteoarthritis   . Renal disorder    No past surgical history on file. Social History   Tobacco Use  Smoking Status  Never Smoker  Smokeless Tobacco Never Used     Heidi Moran is registered for the 12-week, twice weekly PREP at [facility] w/her husband that began today. She and her husband forgot about the start date but will be here on Friday for class.  Heidi Moran 03/25/2018, 4:13 PM

## 2018-04-15 NOTE — Progress Notes (Signed)
[facility] PREP Weekly Session   Patient Details  Name: Heidi Moran MRN: [id number] Date of Birth: 10/06/1935 Age: 83 y.o. PCP: Burnard Bunting, MD  Vitals:   04/01/18 2050  Weight: 109 lb 12.8 oz (49.8 kg)    [facility] Weekly seesion - 04/15/18 2000      Weekly Session   Topic Discussed  Healthy eating tips      Fun things you did since last meeting:"dinner w/son"   [name] 04/15/2018, 8:51 PM

## 2018-04-15 NOTE — Progress Notes (Signed)
[facility] PREP Weekly Session   Patient Details  Name: AARON BOEH MRN: [id number] Date of Birth: [birth year] Age: 83 y.o. PCP: Burnard Bunting, MD  Vitals:   04/08/18 2116  Weight: 108 lb (49 kg)    [facility] Weekly seesion - 04/15/18 2116      Weekly Session   Topic Discussed  Water        [name] 04/15/2018, 9:16 PM

## 2018-04-15 NOTE — Progress Notes (Signed)
[facility] PREP Weekly Session   Patient Details  Name: Heidi Moran MRN: [id number] Date of Birth: 01/08/1936 Age: 83 y.o. PCP: Burnard Bunting, MD  Vitals:   04/15/18 2138  Weight: 108 lb 3.2 oz (49.1 kg)    [facility] Weekly seesion - 04/15/18 2139      Weekly Session   Topic Discussed  Other ways to be active      Fun things you did since last meeting:"hotdog Tuesday" Things you are grateful for:"my kids"  [name] 04/15/2018, 9:39 PM

## 2018-04-22 NOTE — Progress Notes (Signed)
[facility] PREP Weekly Session   Patient Details  Name: Heidi Moran MRN: [id number] Date of Birth: [birth year] Age: 83 y.o. PCP: Burnard Bunting, MD  Vitals:   04/22/18 2035  Weight: 109 lb 12.8 oz (49.8 kg)    [facility] Weekly seesion - 04/22/18 2000      Weekly Session   Topic Discussed  Restaurant Eating      Things you are grateful for:'my grandkids"  [name] 04/22/2018, 8:36 PM

## 2018-04-29 DIAGNOSIS — E113292 Type 2 diabetes mellitus with mild nonproliferative diabetic retinopathy without macular edema, left eye: Secondary | ICD-10-CM | POA: Diagnosis not present

## 2018-04-29 DIAGNOSIS — E119 Type 2 diabetes mellitus without complications: Secondary | ICD-10-CM | POA: Diagnosis not present

## 2018-04-29 DIAGNOSIS — H52203 Unspecified astigmatism, bilateral: Secondary | ICD-10-CM | POA: Diagnosis not present

## 2018-04-29 DIAGNOSIS — H02055 Trichiasis without entropian left lower eyelid: Secondary | ICD-10-CM | POA: Diagnosis not present

## 2018-05-06 NOTE — Progress Notes (Signed)
[facility] PREP Weekly Session   Patient Details  Name: Heidi Moran MRN: [id number] Date of Birth: 1936/01/30 Age: 83 y.o. PCP: Burnard Bunting, MD  Vitals:   05/06/18 2057  Weight: 107 lb 3.2 oz (48.6 kg)    [facility] Weekly seesion - 05/06/18 2000      Weekly Session   Topic Discussed  Stress management and problem solving    Minutes exercised this week  --   not reported, but she exercised     Fun things you did since last meeting:"lunch w/granddaughter and husband"  [name] 05/06/2018, 8:57 PM

## 2018-05-11 DIAGNOSIS — K862 Cyst of pancreas: Secondary | ICD-10-CM | POA: Diagnosis not present

## 2018-05-11 DIAGNOSIS — R918 Other nonspecific abnormal finding of lung field: Secondary | ICD-10-CM | POA: Diagnosis not present

## 2018-05-11 DIAGNOSIS — E11319 Type 2 diabetes mellitus with unspecified diabetic retinopathy without macular edema: Secondary | ICD-10-CM | POA: Diagnosis not present

## 2018-05-11 DIAGNOSIS — N183 Chronic kidney disease, stage 3 (moderate): Secondary | ICD-10-CM | POA: Diagnosis not present

## 2018-05-11 DIAGNOSIS — M353 Polymyalgia rheumatica: Secondary | ICD-10-CM | POA: Diagnosis not present

## 2018-05-11 DIAGNOSIS — E1129 Type 2 diabetes mellitus with other diabetic kidney complication: Secondary | ICD-10-CM | POA: Diagnosis not present

## 2018-05-11 DIAGNOSIS — D8989 Other specified disorders involving the immune mechanism, not elsewhere classified: Secondary | ICD-10-CM | POA: Diagnosis not present

## 2018-05-11 DIAGNOSIS — R2681 Unsteadiness on feet: Secondary | ICD-10-CM | POA: Diagnosis not present

## 2018-05-11 DIAGNOSIS — I129 Hypertensive chronic kidney disease with stage 1 through stage 4 chronic kidney disease, or unspecified chronic kidney disease: Secondary | ICD-10-CM | POA: Diagnosis not present

## 2018-05-11 DIAGNOSIS — M069 Rheumatoid arthritis, unspecified: Secondary | ICD-10-CM | POA: Diagnosis not present

## 2018-05-11 DIAGNOSIS — E7849 Other hyperlipidemia: Secondary | ICD-10-CM | POA: Diagnosis not present

## 2018-09-16 DIAGNOSIS — K862 Cyst of pancreas: Secondary | ICD-10-CM | POA: Diagnosis not present

## 2018-09-16 DIAGNOSIS — R918 Other nonspecific abnormal finding of lung field: Secondary | ICD-10-CM | POA: Diagnosis not present

## 2018-09-16 DIAGNOSIS — E785 Hyperlipidemia, unspecified: Secondary | ICD-10-CM | POA: Diagnosis not present

## 2018-09-16 DIAGNOSIS — R2681 Unsteadiness on feet: Secondary | ICD-10-CM | POA: Diagnosis not present

## 2018-09-16 DIAGNOSIS — E1129 Type 2 diabetes mellitus with other diabetic kidney complication: Secondary | ICD-10-CM | POA: Diagnosis not present

## 2018-09-16 DIAGNOSIS — E11319 Type 2 diabetes mellitus with unspecified diabetic retinopathy without macular edema: Secondary | ICD-10-CM | POA: Diagnosis not present

## 2018-09-16 DIAGNOSIS — N183 Chronic kidney disease, stage 3 (moderate): Secondary | ICD-10-CM | POA: Diagnosis not present

## 2018-09-16 DIAGNOSIS — D8989 Other specified disorders involving the immune mechanism, not elsewhere classified: Secondary | ICD-10-CM | POA: Diagnosis not present

## 2018-09-16 DIAGNOSIS — M353 Polymyalgia rheumatica: Secondary | ICD-10-CM | POA: Diagnosis not present

## 2018-09-16 DIAGNOSIS — I129 Hypertensive chronic kidney disease with stage 1 through stage 4 chronic kidney disease, or unspecified chronic kidney disease: Secondary | ICD-10-CM | POA: Diagnosis not present

## 2018-09-16 DIAGNOSIS — M069 Rheumatoid arthritis, unspecified: Secondary | ICD-10-CM | POA: Diagnosis not present

## 2018-10-20 DIAGNOSIS — H903 Sensorineural hearing loss, bilateral: Secondary | ICD-10-CM | POA: Diagnosis not present

## 2018-10-20 DIAGNOSIS — H9113 Presbycusis, bilateral: Secondary | ICD-10-CM | POA: Diagnosis not present

## 2018-10-20 DIAGNOSIS — J310 Chronic rhinitis: Secondary | ICD-10-CM | POA: Diagnosis not present

## 2018-11-30 DIAGNOSIS — Z961 Presence of intraocular lens: Secondary | ICD-10-CM | POA: Diagnosis not present

## 2018-11-30 DIAGNOSIS — E113293 Type 2 diabetes mellitus with mild nonproliferative diabetic retinopathy without macular edema, bilateral: Secondary | ICD-10-CM | POA: Diagnosis not present

## 2018-11-30 DIAGNOSIS — D3131 Benign neoplasm of right choroid: Secondary | ICD-10-CM | POA: Diagnosis not present

## 2018-12-16 ENCOUNTER — Ambulatory Visit (INDEPENDENT_AMBULATORY_CARE_PROVIDER_SITE_OTHER)
Admission: RE | Admit: 2018-12-16 | Discharge: 2018-12-16 | Disposition: A | Discharge status: Home / Self Care | Payer: Medicare Other | Source: Ambulatory Visit | Attending: Pulmonary Disease | Admitting: Pulmonary Disease

## 2018-12-16 ENCOUNTER — Other Ambulatory Visit: Payer: Self-pay

## 2018-12-16 ENCOUNTER — Telehealth: Payer: Self-pay | Admitting: Pulmonary Disease

## 2018-12-16 DIAGNOSIS — E7849 Other hyperlipidemia: Secondary | ICD-10-CM | POA: Diagnosis not present

## 2018-12-16 DIAGNOSIS — R918 Other nonspecific abnormal finding of lung field: Secondary | ICD-10-CM

## 2018-12-16 DIAGNOSIS — M859 Disorder of bone density and structure, unspecified: Secondary | ICD-10-CM | POA: Diagnosis not present

## 2018-12-16 DIAGNOSIS — E1129 Type 2 diabetes mellitus with other diabetic kidney complication: Secondary | ICD-10-CM | POA: Diagnosis not present

## 2018-12-16 DIAGNOSIS — M8580 Other specified disorders of bone density and structure, unspecified site: Secondary | ICD-10-CM | POA: Diagnosis not present

## 2018-12-16 NOTE — Telephone Encounter (Signed)
Received call report from [facility] Radiology for CT Chest from today 12/16/18   Please review IMPRESSION copied below:  IMPRESSION: 1. Lobular morphology and some areas of cavitation with new ground-glass and tiny centrilobular nodules in the upper lobes. Findings are indeterminate but raise the question of an infectious or inflammatory process particularly given some tree-in-bud in the right lower lobe. Nodules related to rheumatoid are also considered. 2. Patient is currently being followed by pulmonary medicine. Continued follow-up in perhaps a short interval follow-up from today's study in light of subtle changes would be helpful. 3. Neoplasm is felt less likely given stability of dominant nodules in the chest.   Dr Vaughan Browner please advise, thank you.

## 2018-12-17 ENCOUNTER — Encounter: Payer: Self-pay | Admitting: Pulmonary Disease

## 2018-12-17 ENCOUNTER — Telehealth (HOSPITAL_COMMUNITY): Payer: Self-pay

## 2018-12-17 ENCOUNTER — Other Ambulatory Visit: Payer: Self-pay

## 2018-12-17 ENCOUNTER — Ambulatory Visit (INDEPENDENT_AMBULATORY_CARE_PROVIDER_SITE_OTHER): Payer: Medicare Other | Admitting: Pulmonary Disease

## 2018-12-17 ENCOUNTER — Encounter (HOSPITAL_COMMUNITY): Payer: Self-pay | Admitting: *Deleted

## 2018-12-17 VITALS — BP 140/72 | HR 80 | Temp 97.8°F | Ht 59.0 in | Wt 111.0 lb

## 2018-12-17 DIAGNOSIS — R918 Other nonspecific abnormal finding of lung field: Secondary | ICD-10-CM | POA: Diagnosis not present

## 2018-12-17 DIAGNOSIS — J9611 Chronic respiratory failure with hypoxia: Secondary | ICD-10-CM | POA: Diagnosis not present

## 2018-12-17 MED ORDER — FAMOTIDINE 20 MG PO TABS: 20.0000 mg | ORAL_TABLET | Freq: Every day | ORAL | 3 refills | Status: DC

## 2018-12-17 NOTE — Patient Instructions (Signed)
We will start you on Pepcid 20 mg at night for acid reflux We will order CT chest without contrast in 6 months for follow-up of lung nodules Follow-up in clinic after CT chest.

## 2018-12-17 NOTE — Progress Notes (Signed)
  Received referral from Dr. Vaughan Browner for this pt to participate in Pulmonary rehab again with the diagnosis of Chronic Respiratory Failure with Hypoxia. Clinical review of pt follow up appt on 9/30 with Dr. Vaughan Browner - Pulmonary office note.   Pt who is known to pulmonary rehab staff from a previous participation in undergraduate in 2019 where she completed 23 visits.  Pt started the Physician referral exercise program at [facility] up until the closure due to Covid-19. She appropriate for scheduling for Pulmonary rehab.  Will forward to support staff for scheduling and verification of insurance eligibility/benefits with pt  consent. Cherre Huger, BSN Cardiac and Training and development officer

## 2018-12-17 NOTE — Telephone Encounter (Signed)
Pt insurance is active and benefits verified through Medicare A/B. Co-pay $0.00, DED $198.00/$198.00 met, out of pocket $0.00/$0.00 met, co-insurance 20%. No pre-authorization required. 12/17/2018 @ 308PM  2ndary insurance is active and benefits verified through [employer]. Co-pay $0.00, DED $0.00/$0.00 met, out of pocket $0.00/$0.00 met, co-insurance 0%. No pre-authorization required.

## 2018-12-17 NOTE — Progress Notes (Signed)
Heidi Moran    [id number]    1935-09-21  Primary Care Physician:Aronson, Delfino Lovett, MD  Referring Physician: Burnard Bunting, MD [address] [place],  [place] [address]  Chief complaint:  Follow up for  Rheumatoid lung nodules Hypoxic respiratory failure on oxygen.  HPI: 83 year old with history of rheumatoid arthritis, diabetes, hyperlipidemia, hypertension.  She was evaluated in the ED recently fatigue, cough with a chest x-ray that showed lung nodules.  She had a follow-up CT scan and PET scan which demonstrated multiple pulmonary nodules, mild uptake in the dominant nodule.  [Reports below]. She has history of rheumatoid arthritis diagnosed about 2 years ago.  She follows with Dr. Trudie Reed, previously treated with Golinumab infusions.  Hospitalized in March 2019 for acute respiratory failure with bronchospasm and hypoxia, upper respiratory tract infection. Flu test was negative, CT chest showed no pulmonary embolism or acute lung abnormality Treated with steroids with improvement in symptoms and discharged on supplemental oxygen to SNF.  Received note from Dr. Trudie Reed, rheumatology dated 10/15/2017 Follow-up for rheumatoid arthritis with stable symptoms, no flareups Previously on Golimumab but currently not on therapy.  Pets: Cats.  No birds, farm animals Occupation: Housewife Exposures: None exposures Smoking history: Never smoker Travel History:Grew up in [place].  Moved to [place] 35 years ago.  Interim history: Continues on supplemental oxygen.  Dyspnea on exertion is stable Complains of increasing fatigue, weakness Is here for review of recent CT scan.  Complains of heartburn, acid reflux.  Symptoms are mostly at night, while lying down.  Outpatient Encounter Medications as of 12/17/2018  Medication Sig  . atorvastatin (LIPITOR) 20 MG tablet Take 20 mg by mouth daily.  . Calcium-Vitamin D-Vitamin K (VIACTIV PO) Take 1 tablet by mouth daily.  Marland Kitchen  glyBURIDE-metformin (GLUCOVANCE) 2.5-500 MG tablet Take 1 tablet by mouth daily with breakfast. Pt takes one in the morning with breakfast and one in the evening  . lisinopril (PRINIVIL,ZESTRIL) 10 MG tablet Take 10 mg by mouth daily.  Marland Kitchen OVER THE COUNTER MEDICATION   . [DISCONTINUED] ipratropium-albuterol (DUONEB) 0.5-2.5 (3) MG/3ML SOLN Take 3 mLs by nebulization every 4 (four) hours as needed. (Patient not taking: Reported on 12/15/2017)   No facility-administered encounter medications on file as of 12/17/2018.    Physical Exam: Blood pressure 140/72, pulse 80, temperature 97.8 F (36.6 C), temperature source Temporal, height 4\' 11"  (1.499 m), weight 111 lb (50.3 kg), SpO2 97 %. Gen:      No acute distress, frail, elderly HEENT:  EOMI, sclera anicteric Neck:     No masses; no thyromegaly Lungs:    Clear to auscultation bilaterally; normal respiratory effort CV:         Regular rate and rhythm; no murmurs Abd:      + bowel sounds; soft, non-tender; no palpable masses, no distension Ext:    No edema; adequate peripheral perfusion Skin:      Warm and dry; no rash Neuro: alert and oriented x 3 Psych: normal mood and affect  Data Reviewed: Imaging Chest x-ray 01/28/17-multiple nodular densities. CT chest 02/12/17-borderline precarinal lymph nodes, multiple pulmonary nodules bilaterally.  The largest one in the right upper lobe measures 13 x11 mm. PET scan 02/27/17-dominant nodule in the right middle lobe has mild-moderate FDG uptake.  No other areas of uptake. CTA 05/17/17-no pulmonary embolism, stable lung nodules. CT scan 06/11/17-stability of pulmonary nodules the largest is 1.3 cm in the right middle lobe. CT scan 12/12/2017- stable multiple bilateral  nodules.  New compression deformity in L1. CT scan 12/16/2018- some of the pulmonary nodules for change in morphology to lobular with possible cavitation.  I have reviewed the images personally  Received reports (no images) from primary care  office Chest x-ray 04/11/16 Stable right lung nodules likely granuloma.  No change compared to 03/31/15  PFTs  12/15/2017 FVC 0.95 (47%), FEV1 0.42 [28%), F/F 44, TLC 142%, DLCO 38% Severe obstruction, diffusion impairment  FENO 06/24/17-36  Labs CBC 06/24/2017-WBC 6.2, eos 1.5%, absolute eosinophil count 100 Blood allergy profile 06/24/2017-IgE to 32, sensitive to cat, Aspergillus, mouse urine Alpha-1 antitrypsin 12/15/2017-155, PI MM  Assessment:  Severe obstruction PFTs reviewed which shows severe obstruction which is surprising as she is a non-smoker. Breathing seems to be doing okay.  Will observe off inhalers Continue supplemental oxygen Referred to pulmonary rehab again since it has helped in the past  Follow up for multiple pulmonary nodules Her images reviewed which shows stable rounded nodular opacities with mild PET activity.  Per report from primary care this has been present since at least 2017. This could be consistent with rheumatoid nodules which are are known to have low FDG uptake. The PET scan does not reveal any other abnormal areas of uptake.  She is a non-smoker with low risk of malignancy.    Last CT scan shows slight change in morphology in some of them.  Suspicion for malignancy is still low. We will get a short-term follow-up CT in 6 months. She is not a good candidate for bronchoscopic or CT-guided biopsy given her age and frailty.  GERD Has increasing symptoms of acid reflux.  Start Pepcid 20 mg at night.  Plan/Recommendations: - CT scan without contrast in 6 months - Continue supplemental oxygen -Pulmonary rehab  Marshell Garfinkel MD [facility] 12/17/2018, 1:46 PM  CC: Burnard Bunting, MD

## 2018-12-18 ENCOUNTER — Telehealth (HOSPITAL_COMMUNITY): Payer: Self-pay

## 2018-12-23 DIAGNOSIS — I129 Hypertensive chronic kidney disease with stage 1 through stage 4 chronic kidney disease, or unspecified chronic kidney disease: Secondary | ICD-10-CM | POA: Diagnosis not present

## 2018-12-23 DIAGNOSIS — D638 Anemia in other chronic diseases classified elsewhere: Secondary | ICD-10-CM | POA: Diagnosis not present

## 2018-12-23 DIAGNOSIS — Z0000 Encounter for general adult medical examination without abnormal findings: Secondary | ICD-10-CM | POA: Diagnosis not present

## 2018-12-23 DIAGNOSIS — D8989 Other specified disorders involving the immune mechanism, not elsewhere classified: Secondary | ICD-10-CM | POA: Diagnosis not present

## 2018-12-23 DIAGNOSIS — E785 Hyperlipidemia, unspecified: Secondary | ICD-10-CM | POA: Diagnosis not present

## 2018-12-23 DIAGNOSIS — E11319 Type 2 diabetes mellitus with unspecified diabetic retinopathy without macular edema: Secondary | ICD-10-CM | POA: Diagnosis not present

## 2018-12-23 DIAGNOSIS — N183 Chronic kidney disease, stage 3 unspecified: Secondary | ICD-10-CM | POA: Diagnosis not present

## 2018-12-23 DIAGNOSIS — E1129 Type 2 diabetes mellitus with other diabetic kidney complication: Secondary | ICD-10-CM | POA: Diagnosis not present

## 2018-12-23 DIAGNOSIS — I10 Essential (primary) hypertension: Secondary | ICD-10-CM | POA: Diagnosis not present

## 2018-12-23 DIAGNOSIS — R945 Abnormal results of liver function studies: Secondary | ICD-10-CM | POA: Diagnosis not present

## 2018-12-23 DIAGNOSIS — E875 Hyperkalemia: Secondary | ICD-10-CM | POA: Diagnosis not present

## 2018-12-23 DIAGNOSIS — R82998 Other abnormal findings in urine: Secondary | ICD-10-CM | POA: Diagnosis not present

## 2018-12-23 DIAGNOSIS — M069 Rheumatoid arthritis, unspecified: Secondary | ICD-10-CM | POA: Diagnosis not present

## 2018-12-28 ENCOUNTER — Telehealth (HOSPITAL_COMMUNITY): Payer: Self-pay | Admitting: Internal Medicine

## 2018-12-29 ENCOUNTER — Ambulatory Visit (HOSPITAL_COMMUNITY): Payer: Medicare Other

## 2019-01-01 NOTE — Telephone Encounter (Signed)
Addressed at 45.1.2020 visit with Dr Vaughan Browner

## 2019-01-05 DIAGNOSIS — Z1212 Encounter for screening for malignant neoplasm of rectum: Secondary | ICD-10-CM | POA: Diagnosis not present

## 2019-04-05 ENCOUNTER — Other Ambulatory Visit: Payer: Medicare Other

## 2019-04-08 IMAGING — CT CT ANGIO CHEST
2 of 6 series · 18 of 36 positions shown · IV contrast (APPLIED)
Comparison: PET-CT 02/27/2017.

CLINICAL DATA: 81-year-old female with history of shortness of
breath over the past couple weeks.

EXAM:
CT ANGIOGRAPHY CHEST WITH CONTRAST
TECHNIQUE: Multidetector CT imaging of the chest was performed using the
standard protocol during bolus administration of intravenous
contrast. Multiplanar CT image reconstructions and MIPs were
obtained to evaluate the vascular anatomy.
CONTRAST:  50mL M6XZV6-L2T IOPAMIDOL (M6XZV6-L2T) INJECTION 76%

[Series 7: thins · axial · 0.54mm/px · z∈[+1080,+1356]mm · 17 of 439 slices shown]
[im 22/439  lung]
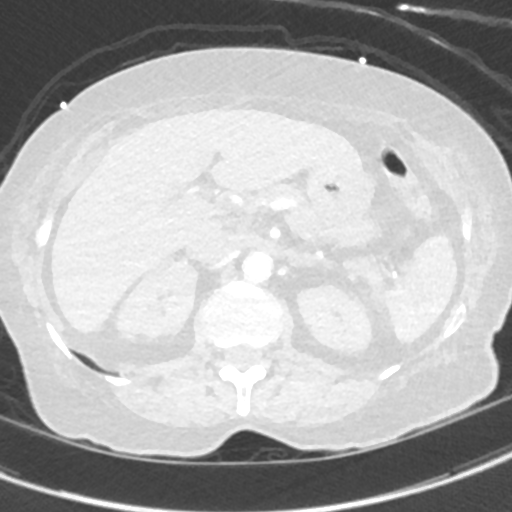
[im 44/439  mediastinal]
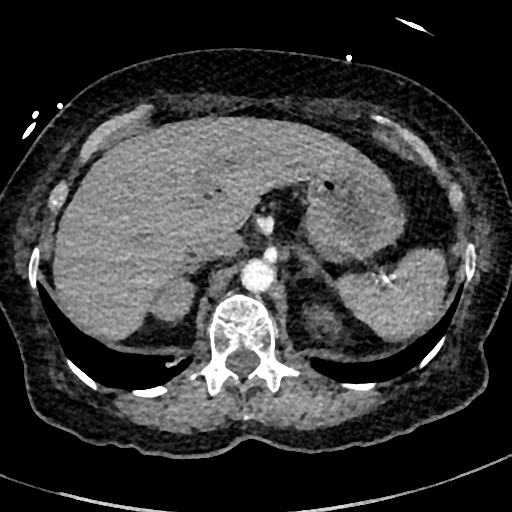
[im 66/439  lung]
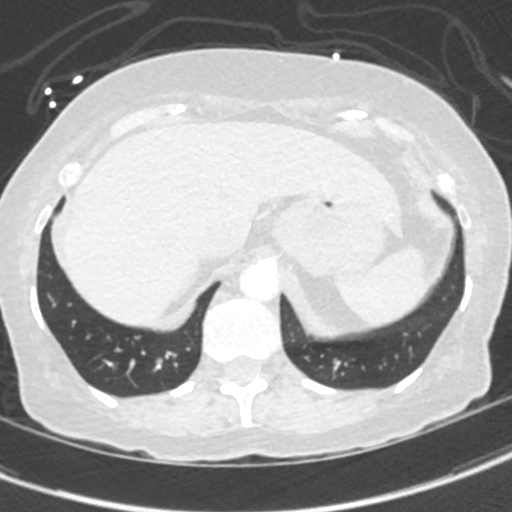
[im 88/439  mediastinal]
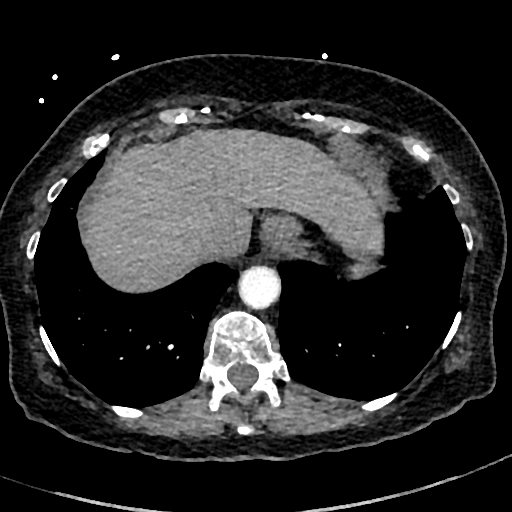
[im 132/439  lung]
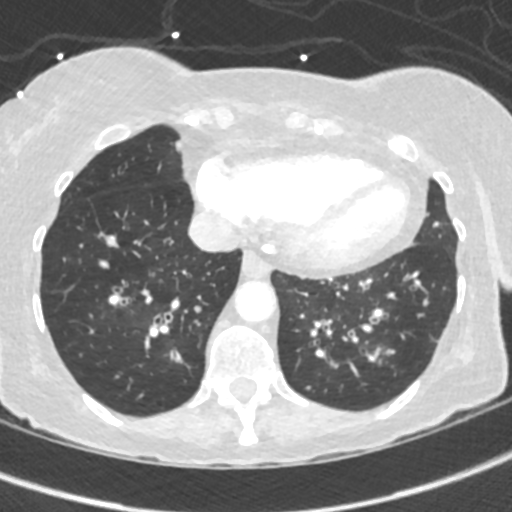
[im 154/439  mediastinal]
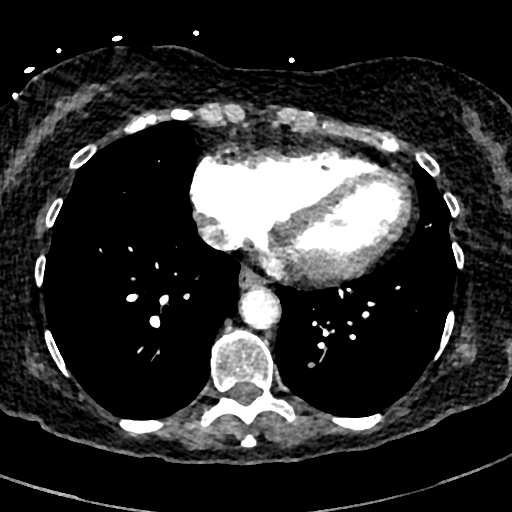
[im 176/439  lung]
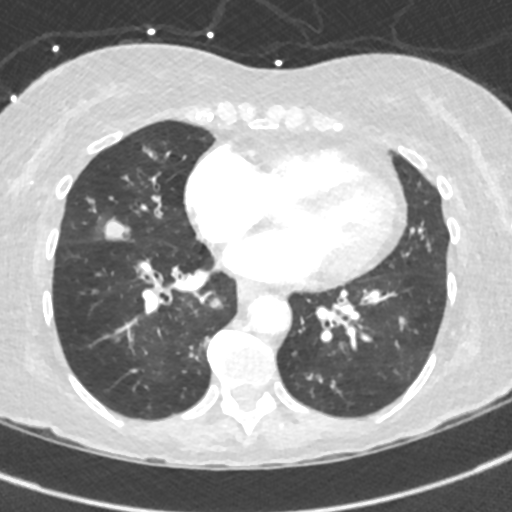
[im 198/439  mediastinal]
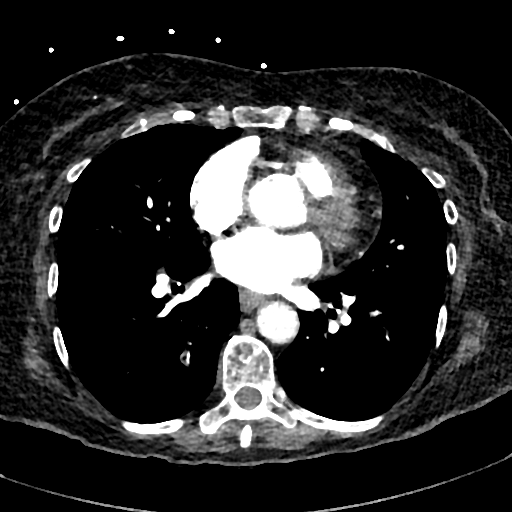
[im 220/439  lung]
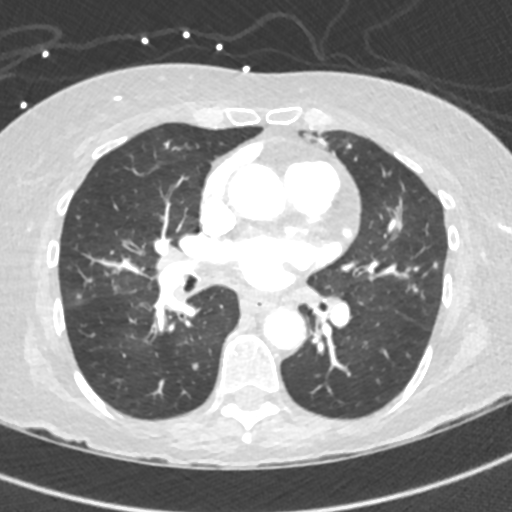
[im 241/439  mediastinal]
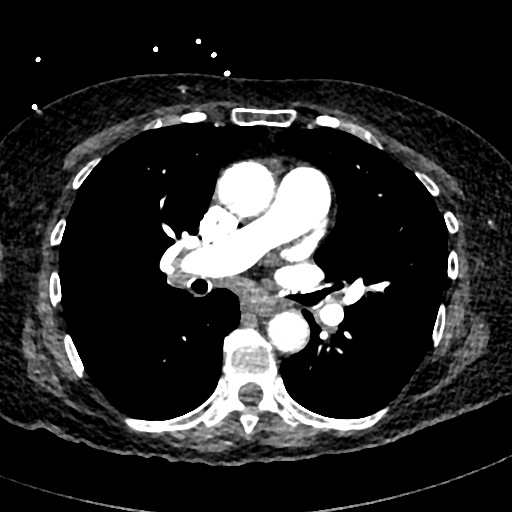
[im 263/439  lung]
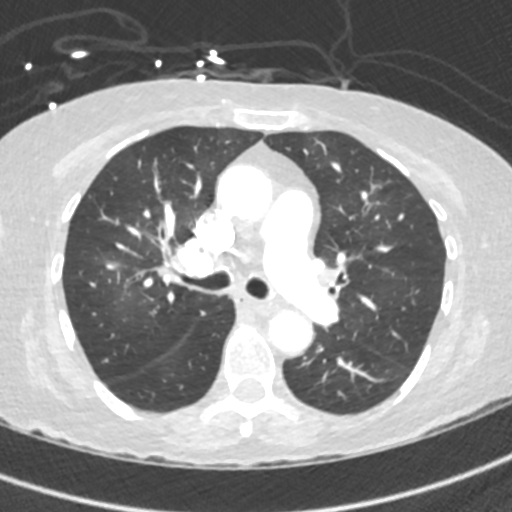
[im 285/439  mediastinal]
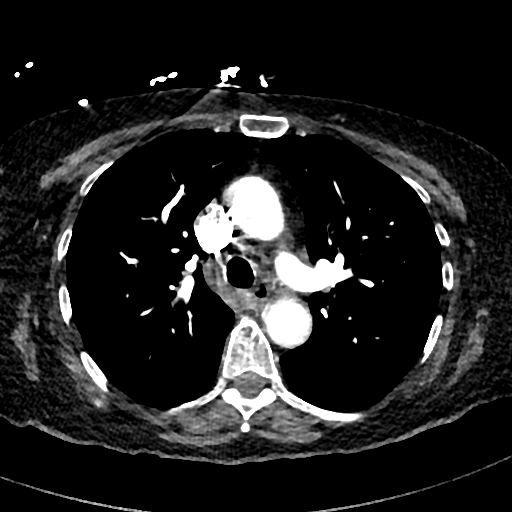
[im 307/439  lung]
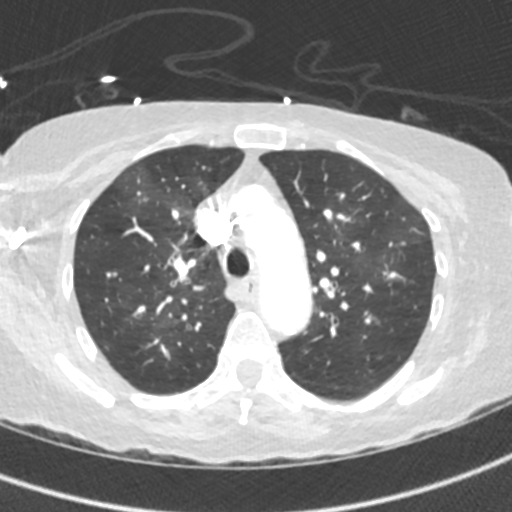
[im 351/439  mediastinal]
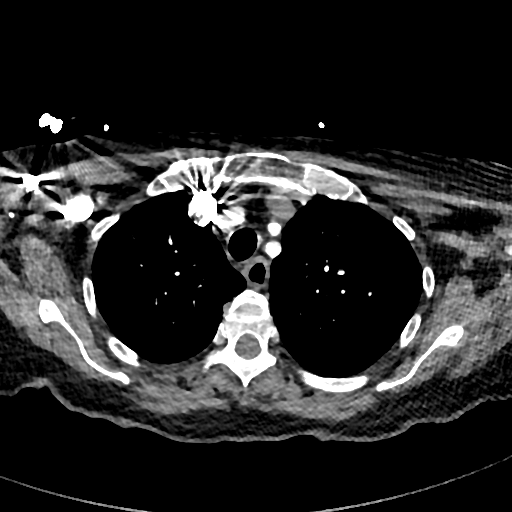
[im 373/439  lung]
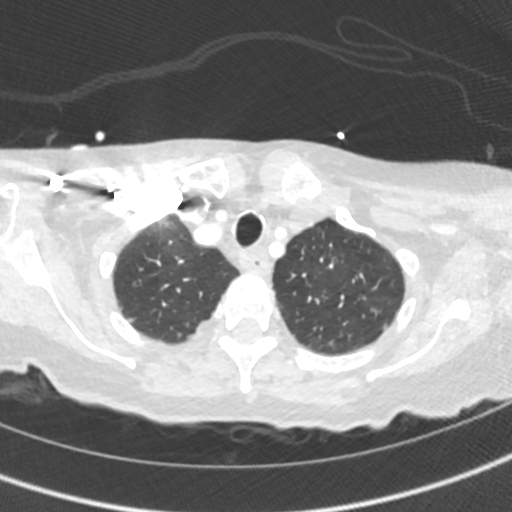
[im 395/439  mediastinal]
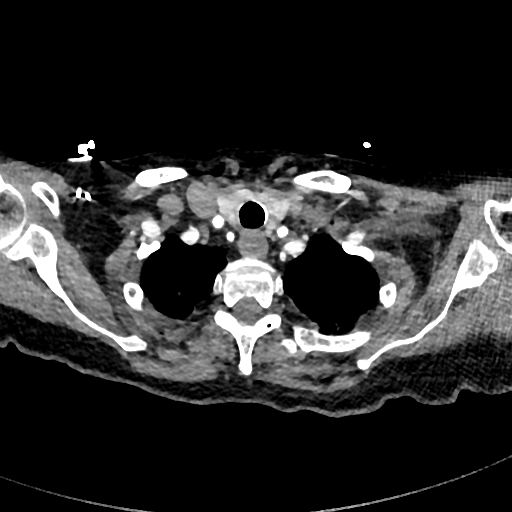
[im 417/439  lung]
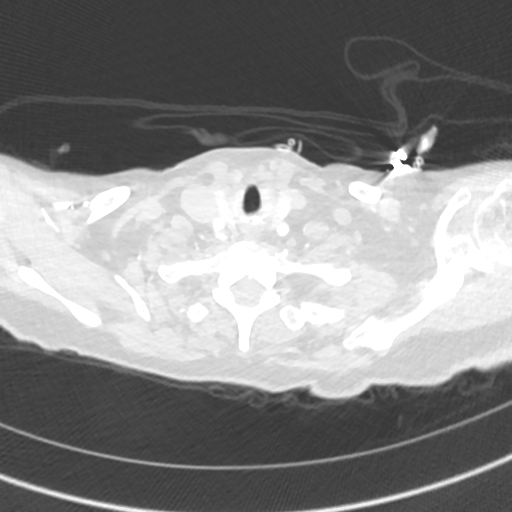

[Series 8: cor · coronal · 0.59mm/px · 1 of 97 slices shown]
[im 49/97  mediastinal]
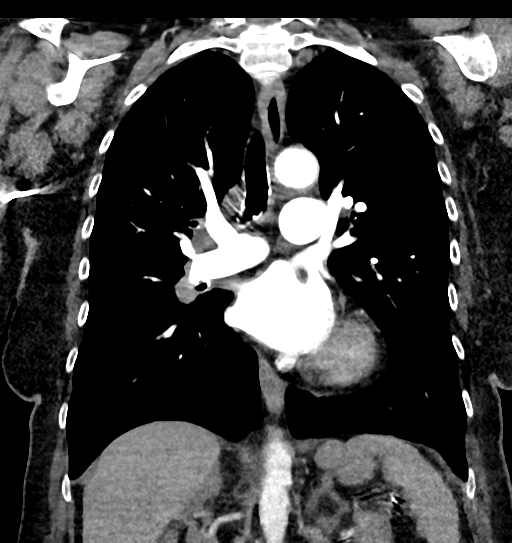

[18 of 36 positions shown; findings below may reference images not displayed]

FINDINGS: Cardiovascular: No filling defect in the pulmonary artery tree to
suggest underlying pulmonary embolism. Heart size is borderline
enlarged. There is no significant pericardial fluid, thickening or
pericardial calcification.

Mediastinum/Nodes: Multiple prominent borderline enlarged
mediastinal and bilateral hilar lymph nodes measuring up to 12 mm in
short axis. Esophagus is unremarkable in appearance. No axillary
lymphadenopathy.

Lungs/Pleura: Again noted are numerous pulmonary nodules scattered
throughout the lungs bilaterally, similar in size, number and
distribution to prior PET-CT 02/27/2017, largest of which is a 13 mm
nodule in the lateral segment of the right middle lobe (axial image
91 of series 6). Several of these nodules have some surrounding
ground-glass attenuation. No acute consolidative airspace disease.
No pleural effusions.

Upper Abdomen: Aortic atherosclerosis.

Musculoskeletal: There are no aggressive appearing lytic or blastic
lesions noted in the visualized portions of the skeleton.

Review of the MIP images confirms the above findings.
IMPRESSION: 1. No evidence of pulmonary embolism.
2. No acute findings noted in the thorax.
3. Numerous bilateral pulmonary nodules stable compared to prior
examination from 02/27/2017. These are of uncertain etiology and
significance, but their stability over time could suggest a benign
etiology unless the patient is undergoing active treatment for
malignancy. One consideration is that of diffuse idiopathic
neuroendocrine cell hyperplasia (DIPNECH). Continued attention on
followup studies is recommended. Outpatient referral to pulmonology
for further evaluation should also be considered if not already
obtained.

## 2019-04-21 DIAGNOSIS — N183 Chronic kidney disease, stage 3 unspecified: Secondary | ICD-10-CM | POA: Diagnosis not present

## 2019-04-21 DIAGNOSIS — E1129 Type 2 diabetes mellitus with other diabetic kidney complication: Secondary | ICD-10-CM | POA: Diagnosis not present

## 2019-04-21 DIAGNOSIS — E11319 Type 2 diabetes mellitus with unspecified diabetic retinopathy without macular edema: Secondary | ICD-10-CM | POA: Diagnosis not present

## 2019-04-21 DIAGNOSIS — E785 Hyperlipidemia, unspecified: Secondary | ICD-10-CM | POA: Diagnosis not present

## 2019-04-21 DIAGNOSIS — D8989 Other specified disorders involving the immune mechanism, not elsewhere classified: Secondary | ICD-10-CM | POA: Diagnosis not present

## 2019-04-21 DIAGNOSIS — I129 Hypertensive chronic kidney disease with stage 1 through stage 4 chronic kidney disease, or unspecified chronic kidney disease: Secondary | ICD-10-CM | POA: Diagnosis not present

## 2019-04-21 DIAGNOSIS — M069 Rheumatoid arthritis, unspecified: Secondary | ICD-10-CM | POA: Diagnosis not present

## 2019-04-21 DIAGNOSIS — Z1331 Encounter for screening for depression: Secondary | ICD-10-CM | POA: Diagnosis not present

## 2019-04-21 DIAGNOSIS — D638 Anemia in other chronic diseases classified elsewhere: Secondary | ICD-10-CM | POA: Diagnosis not present

## 2019-04-21 DIAGNOSIS — I10 Essential (primary) hypertension: Secondary | ICD-10-CM | POA: Diagnosis not present

## 2019-05-02 ENCOUNTER — Ambulatory Visit: Payer: Medicare Other | Attending: Internal Medicine

## 2019-05-02 DIAGNOSIS — Z23 Encounter for immunization: Secondary | ICD-10-CM | POA: Insufficient documentation

## 2019-05-02 NOTE — Progress Notes (Signed)
   Covid-19 Vaccination Clinic  Name:  Heidi Moran    MRN: [id number] DOB: 1935-11-11  05/02/2019  [name] was observed post Covid-19 immunization for 15 minutes without incidence. She was provided with Vaccine Information Sheet and instruction to access the V-Safe system.   [name] was instructed to call 911 with any severe reactions post vaccine: Marland Kitchen Difficulty breathing  . Swelling of your face and throat  . A fast heartbeat  . A bad rash all over your body  . Dizziness and weakness    Immunizations Administered    Name Date Dose VIS Date Route   Pfizer COVID-19 Vaccine 05/02/2019 12:51 PM 0.3 mL 02/26/2019 Intramuscular   Manufacturer: [employer]   Lot: [id number]   [place]: [id number]

## 2019-05-03 DIAGNOSIS — H02055 Trichiasis without entropian left lower eyelid: Secondary | ICD-10-CM | POA: Diagnosis not present

## 2019-05-03 DIAGNOSIS — Z961 Presence of intraocular lens: Secondary | ICD-10-CM | POA: Diagnosis not present

## 2019-05-03 DIAGNOSIS — H52203 Unspecified astigmatism, bilateral: Secondary | ICD-10-CM | POA: Diagnosis not present

## 2019-05-03 DIAGNOSIS — E119 Type 2 diabetes mellitus without complications: Secondary | ICD-10-CM | POA: Diagnosis not present

## 2019-05-25 ENCOUNTER — Ambulatory Visit: Payer: Medicare Other | Attending: Internal Medicine

## 2019-05-25 DIAGNOSIS — Z23 Encounter for immunization: Secondary | ICD-10-CM | POA: Insufficient documentation

## 2019-05-25 NOTE — Progress Notes (Signed)
   Covid-19 Vaccination Clinic  Name:  Heidi Moran    MRN: [id number] DOB: [birth year]  05/25/2019  [name] was observed post Covid-19 immunization for 15 minutes without incident. She was provided with Vaccine Information Sheet and instruction to access the V-Safe system.   [name] was instructed to call 911 with any severe reactions post vaccine: Marland Kitchen Difficulty breathing  . Swelling of face and throat  . A fast heartbeat  . A bad rash all over body  . Dizziness and weakness   Immunizations Administered    Name Date Dose VIS Date Route   Pfizer COVID-19 Vaccine 05/25/2019  4:44 PM 0.3 mL 02/26/2019 Intramuscular   Manufacturer: [place]   Lot: [id number]   [place]: [id number]

## 2019-06-11 ENCOUNTER — Other Ambulatory Visit: Payer: Self-pay | Admitting: Pulmonary Disease

## 2019-06-11 MED ORDER — FAMOTIDINE 20 MG PO TABS: 20.0000 mg | ORAL_TABLET | Freq: Every day | ORAL | 3 refills | Status: DC

## 2019-06-21 ENCOUNTER — Ambulatory Visit
Admission: RE | Admit: 2019-06-21 | Discharge: 2019-06-21 | Disposition: A | Discharge status: Home / Self Care | Payer: Medicare Other | Source: Ambulatory Visit | Attending: Pulmonary Disease | Admitting: Pulmonary Disease

## 2019-06-21 ENCOUNTER — Other Ambulatory Visit: Payer: Self-pay

## 2019-06-21 ENCOUNTER — Other Ambulatory Visit: Payer: Medicare Other

## 2019-06-21 DIAGNOSIS — R918 Other nonspecific abnormal finding of lung field: Secondary | ICD-10-CM

## 2019-06-24 ENCOUNTER — Other Ambulatory Visit: Payer: Self-pay

## 2019-06-24 ENCOUNTER — Ambulatory Visit (INDEPENDENT_AMBULATORY_CARE_PROVIDER_SITE_OTHER): Payer: Medicare Other | Admitting: Pulmonary Disease

## 2019-06-24 ENCOUNTER — Encounter: Payer: Self-pay | Admitting: Pulmonary Disease

## 2019-06-24 DIAGNOSIS — R918 Other nonspecific abnormal finding of lung field: Secondary | ICD-10-CM

## 2019-06-24 MED ORDER — FAMOTIDINE 20 MG PO TABS: 20.0000 mg | ORAL_TABLET | Freq: Every day | ORAL | 3 refills | Status: DC

## 2019-06-24 NOTE — Progress Notes (Signed)
Heidi Moran    [id number]    1936/03/05  Primary Care Physician:Aronson, Delfino Lovett, MD  Referring Physician: Burnard Bunting, MD [address] [place] [address]  Chief complaint:  Follow up for  Rheumatoid lung nodules Hypoxic respiratory failure on oxygen.  HPI: 84 year old with history of rheumatoid arthritis, diabetes, hyperlipidemia, hypertension.  She was evaluated in the ED recently fatigue, cough with a chest x-ray that showed lung nodules.  She had a follow-up CT scan and PET scan which demonstrated multiple pulmonary nodules, mild uptake in the dominant nodule.  [Reports below]. She has history of rheumatoid arthritis diagnosed about 2 years ago.  She follows with Dr. Trudie Reed, previously treated with Golinumab infusions.  Hospitalized in March 2019 for acute respiratory failure with bronchospasm and hypoxia, upper respiratory tract infection. Flu test was negative, CT chest showed no pulmonary embolism or acute lung abnormality Treated with steroids with improvement in symptoms and discharged on supplemental oxygen to SNF.  Received note from Dr. Trudie Reed, rheumatology dated 10/15/2017 Follow-up for rheumatoid arthritis with stable symptoms, no flareups Previously on Golimumab but currently not on therapy.  Pets: Cats.  No birds, farm animals Occupation: Housewife Exposures: None exposures Smoking history: Never smoker Travel History:Grew up in [place].  Moved to [place] 35 years ago.  Interim history: Continues on supplemental oxygen.  Dyspnea on exertion is stable Complains of increasing fatigue, weakness Is here for review of recent CT scan.  Complains of heartburn, acid reflux.  Symptoms are mostly at night, while lying down.  Outpatient Encounter Medications as of 06/24/2019  Medication Sig  . atorvastatin (LIPITOR) 20 MG tablet Take 20 mg by mouth daily.  . Calcium-Vitamin D-Vitamin K (VIACTIV PO) Take 1 tablet by mouth daily.  .  famotidine (PEPCID) 20 MG tablet Take 1 tablet (20 mg total) by mouth at bedtime.  Marland Kitchen glyBURIDE-metformin (GLUCOVANCE) 2.5-500 MG tablet Take 1 tablet by mouth daily with breakfast. Pt takes one in the morning with breakfast and one in the evening  . lisinopril (PRINIVIL,ZESTRIL) 10 MG tablet Take 10 mg by mouth daily.  Marland Kitchen OVER THE COUNTER MEDICATION   . [DISCONTINUED] famotidine (PEPCID) 20 MG tablet Take 1 tablet (20 mg total) by mouth at bedtime.   No facility-administered encounter medications on file as of 06/24/2019.   Physical Exam: Blood pressure 140/72, pulse 80, temperature 97.8 F (36.6 C), temperature source Temporal, height 4\' 11"  (1.499 m), weight 111 lb (50.3 kg), SpO2 97 %. Gen:      No acute distress, frail, elderly HEENT:  EOMI, sclera anicteric Neck:     No masses; no thyromegaly Lungs:    Clear to auscultation bilaterally; normal respiratory effort CV:         Regular rate and rhythm; no murmurs Abd:      + bowel sounds; soft, non-tender; no palpable masses, no distension Ext:    No edema; adequate peripheral perfusion Skin:      Warm and dry; no rash Neuro: alert and oriented x 3 Psych: normal mood and affect  Data Reviewed: Imaging Chest x-ray 01/28/17-multiple nodular densities. CT chest 02/12/17-borderline precarinal lymph nodes, multiple pulmonary nodules bilaterally.  The largest one in the right upper lobe measures 13 x11 mm. PET scan 02/27/17-dominant nodule in the right middle lobe has mild-moderate FDG uptake.  No other areas of uptake. CTA 05/17/17-no pulmonary embolism, stable lung nodules. CT scan 06/11/17-stability of pulmonary nodules the largest is 1.3 cm in the right  middle lobe. CT scan 12/12/2017- stable multiple bilateral nodules.  New compression deformity in L1. CT scan 12/16/2018- some of the pulmonary nodules for change in morphology to lobular with possible cavitation. CT scan 06/21/2019-stable pulmonary nodules.  Reviewed images personally  Received  reports (no images) from primary care office Chest x-ray 04/11/16 Stable right lung nodules likely granuloma.  No change compared to 03/31/15  PFTs  12/15/2017 FVC 0.95 (47%), FEV1 0.42 [28%), F/F 44, TLC 142%, DLCO 38% Severe obstruction, diffusion impairment  FENO 06/24/17-36  Labs CBC 06/24/2017-WBC 6.2, eos 1.5%, absolute eosinophil count 100 Blood allergy profile 06/24/2017-IgE to 32, sensitive to cat, Aspergillus, mouse urine Alpha-1 antitrypsin 12/15/2017-155, PI MM  Assessment:  Severe obstruction PFTs reviewed which shows severe obstruction which is surprising as she is a non-smoker. Breathing seems to be doing okay.  Will observe off inhalers Continue supplemental oxygen                                  Follow up for multiple pulmonary nodules Her images reviewed which shows stable rounded nodular opacities with mild PET activity.  Per report from primary care this has been present since at least 2017. This could be consistent with rheumatoid nodules which are are known to have low FDG uptake. The PET scan does not reveal any other abnormal areas of uptake.  She is a non-smoker with low risk of malignancy.    CT scan reviewed with stable appearance of pulmonary nodules which is good news. Follow-up CT in 1 year She is not a good candidate for bronchoscopic or CT-guided biopsy given her age and frailty.  GERD Continue Pepcid at night  Plan/Recommendations: - CT scan without contrast in 12 months - Continue supplemental oxygen  Marshell Garfinkel MD [facility] 06/24/2019, 11:40 AM  CC: Burnard Bunting, MD

## 2019-06-24 NOTE — Patient Instructions (Signed)
Glad you are doing well with regard to your breathing Your CT scan shows stable lung nodules which are likely benign We will order CT chest without contrast in 1 year Follow-up in clinic after CT

## 2019-08-25 DIAGNOSIS — N183 Chronic kidney disease, stage 3 unspecified: Secondary | ICD-10-CM | POA: Diagnosis not present

## 2019-08-25 DIAGNOSIS — M1990 Unspecified osteoarthritis, unspecified site: Secondary | ICD-10-CM | POA: Diagnosis not present

## 2019-08-25 DIAGNOSIS — E1129 Type 2 diabetes mellitus with other diabetic kidney complication: Secondary | ICD-10-CM | POA: Diagnosis not present

## 2019-08-25 DIAGNOSIS — R918 Other nonspecific abnormal finding of lung field: Secondary | ICD-10-CM | POA: Diagnosis not present

## 2019-08-25 DIAGNOSIS — E11319 Type 2 diabetes mellitus with unspecified diabetic retinopathy without macular edema: Secondary | ICD-10-CM | POA: Diagnosis not present

## 2019-08-25 DIAGNOSIS — D8989 Other specified disorders involving the immune mechanism, not elsewhere classified: Secondary | ICD-10-CM | POA: Diagnosis not present

## 2019-08-25 DIAGNOSIS — M353 Polymyalgia rheumatica: Secondary | ICD-10-CM | POA: Diagnosis not present

## 2019-08-25 DIAGNOSIS — I129 Hypertensive chronic kidney disease with stage 1 through stage 4 chronic kidney disease, or unspecified chronic kidney disease: Secondary | ICD-10-CM | POA: Diagnosis not present

## 2019-10-07 DIAGNOSIS — I70203 Unspecified atherosclerosis of native arteries of extremities, bilateral legs: Secondary | ICD-10-CM | POA: Diagnosis not present

## 2019-10-07 DIAGNOSIS — I739 Peripheral vascular disease, unspecified: Secondary | ICD-10-CM | POA: Diagnosis not present

## 2019-10-07 DIAGNOSIS — I70223 Atherosclerosis of native arteries of extremities with rest pain, bilateral legs: Secondary | ICD-10-CM | POA: Diagnosis not present

## 2019-11-29 DIAGNOSIS — B351 Tinea unguium: Secondary | ICD-10-CM | POA: Diagnosis not present

## 2019-11-29 DIAGNOSIS — I739 Peripheral vascular disease, unspecified: Secondary | ICD-10-CM | POA: Diagnosis not present

## 2019-12-07 DIAGNOSIS — H04123 Dry eye syndrome of bilateral lacrimal glands: Secondary | ICD-10-CM | POA: Diagnosis not present

## 2019-12-07 DIAGNOSIS — Z961 Presence of intraocular lens: Secondary | ICD-10-CM | POA: Diagnosis not present

## 2019-12-07 DIAGNOSIS — D3131 Benign neoplasm of right choroid: Secondary | ICD-10-CM | POA: Diagnosis not present

## 2019-12-07 DIAGNOSIS — E113293 Type 2 diabetes mellitus with mild nonproliferative diabetic retinopathy without macular edema, bilateral: Secondary | ICD-10-CM | POA: Diagnosis not present

## 2019-12-17 DIAGNOSIS — B354 Tinea corporis: Secondary | ICD-10-CM | POA: Diagnosis not present

## 2019-12-20 DIAGNOSIS — E1129 Type 2 diabetes mellitus with other diabetic kidney complication: Secondary | ICD-10-CM | POA: Diagnosis not present

## 2019-12-20 DIAGNOSIS — E785 Hyperlipidemia, unspecified: Secondary | ICD-10-CM | POA: Diagnosis not present

## 2019-12-20 DIAGNOSIS — M859 Disorder of bone density and structure, unspecified: Secondary | ICD-10-CM | POA: Diagnosis not present

## 2019-12-27 DIAGNOSIS — M1990 Unspecified osteoarthritis, unspecified site: Secondary | ICD-10-CM | POA: Diagnosis not present

## 2019-12-27 DIAGNOSIS — E785 Hyperlipidemia, unspecified: Secondary | ICD-10-CM | POA: Diagnosis not present

## 2019-12-27 DIAGNOSIS — Z0000 Encounter for general adult medical examination without abnormal findings: Secondary | ICD-10-CM | POA: Diagnosis not present

## 2019-12-27 DIAGNOSIS — E1129 Type 2 diabetes mellitus with other diabetic kidney complication: Secondary | ICD-10-CM | POA: Diagnosis not present

## 2019-12-27 DIAGNOSIS — Z23 Encounter for immunization: Secondary | ICD-10-CM | POA: Diagnosis not present

## 2019-12-27 DIAGNOSIS — R918 Other nonspecific abnormal finding of lung field: Secondary | ICD-10-CM | POA: Diagnosis not present

## 2019-12-27 DIAGNOSIS — I129 Hypertensive chronic kidney disease with stage 1 through stage 4 chronic kidney disease, or unspecified chronic kidney disease: Secondary | ICD-10-CM | POA: Diagnosis not present

## 2019-12-27 DIAGNOSIS — R82998 Other abnormal findings in urine: Secondary | ICD-10-CM | POA: Diagnosis not present

## 2019-12-27 DIAGNOSIS — D638 Anemia in other chronic diseases classified elsewhere: Secondary | ICD-10-CM | POA: Diagnosis not present

## 2020-01-13 DIAGNOSIS — L309 Dermatitis, unspecified: Secondary | ICD-10-CM | POA: Diagnosis not present

## 2020-01-14 ENCOUNTER — Other Ambulatory Visit: Payer: Self-pay

## 2020-01-14 ENCOUNTER — Encounter (HOSPITAL_COMMUNITY): Payer: Self-pay

## 2020-01-14 ENCOUNTER — Inpatient Hospital Stay (HOSPITAL_COMMUNITY)
Admission: EM | Admit: 2020-01-14 | Discharge: 2020-01-16 | DRG: 190 | Disposition: A | Discharge status: Home / Self Care | Payer: Medicare Other | Attending: Student | Admitting: Student

## 2020-01-14 ENCOUNTER — Emergency Department (HOSPITAL_COMMUNITY): Payer: Medicare Other

## 2020-01-14 DIAGNOSIS — R0602 Shortness of breath: Secondary | ICD-10-CM | POA: Diagnosis not present

## 2020-01-14 DIAGNOSIS — Z20822 Contact with and (suspected) exposure to covid-19: Secondary | ICD-10-CM | POA: Diagnosis present

## 2020-01-14 DIAGNOSIS — E1165 Type 2 diabetes mellitus with hyperglycemia: Secondary | ICD-10-CM | POA: Diagnosis present

## 2020-01-14 DIAGNOSIS — R531 Weakness: Secondary | ICD-10-CM | POA: Diagnosis not present

## 2020-01-14 DIAGNOSIS — M069 Rheumatoid arthritis, unspecified: Secondary | ICD-10-CM | POA: Diagnosis not present

## 2020-01-14 DIAGNOSIS — J441 Chronic obstructive pulmonary disease with (acute) exacerbation: Secondary | ICD-10-CM | POA: Diagnosis not present

## 2020-01-14 DIAGNOSIS — J9611 Chronic respiratory failure with hypoxia: Secondary | ICD-10-CM | POA: Diagnosis present

## 2020-01-14 DIAGNOSIS — M1990 Unspecified osteoarthritis, unspecified site: Secondary | ICD-10-CM | POA: Diagnosis present

## 2020-01-14 DIAGNOSIS — Z9981 Dependence on supplemental oxygen: Secondary | ICD-10-CM

## 2020-01-14 DIAGNOSIS — E1169 Type 2 diabetes mellitus with other specified complication: Secondary | ICD-10-CM | POA: Diagnosis present

## 2020-01-14 DIAGNOSIS — I10 Essential (primary) hypertension: Secondary | ICD-10-CM | POA: Diagnosis present

## 2020-01-14 DIAGNOSIS — I5031 Acute diastolic (congestive) heart failure: Secondary | ICD-10-CM | POA: Diagnosis not present

## 2020-01-14 DIAGNOSIS — J439 Emphysema, unspecified: Secondary | ICD-10-CM | POA: Diagnosis not present

## 2020-01-14 DIAGNOSIS — Z79899 Other long term (current) drug therapy: Secondary | ICD-10-CM

## 2020-01-14 DIAGNOSIS — K219 Gastro-esophageal reflux disease without esophagitis: Secondary | ICD-10-CM | POA: Diagnosis present

## 2020-01-14 DIAGNOSIS — I119 Hypertensive heart disease without heart failure: Secondary | ICD-10-CM | POA: Diagnosis present

## 2020-01-14 DIAGNOSIS — D649 Anemia, unspecified: Secondary | ICD-10-CM | POA: Diagnosis present

## 2020-01-14 DIAGNOSIS — L309 Dermatitis, unspecified: Secondary | ICD-10-CM | POA: Diagnosis present

## 2020-01-14 DIAGNOSIS — Z7984 Long term (current) use of oral hypoglycemic drugs: Secondary | ICD-10-CM

## 2020-01-14 DIAGNOSIS — E876 Hypokalemia: Secondary | ICD-10-CM | POA: Diagnosis not present

## 2020-01-14 DIAGNOSIS — E782 Mixed hyperlipidemia: Secondary | ICD-10-CM | POA: Diagnosis present

## 2020-01-14 DIAGNOSIS — T380X5A Adverse effect of glucocorticoids and synthetic analogues, initial encounter: Secondary | ICD-10-CM | POA: Diagnosis not present

## 2020-01-14 LAB — CBC WITH DIFFERENTIAL/PLATELET
Abs Immature Granulocytes: 0.01 10*3/uL (ref 0.00–0.07)
Basophils Absolute: 0 10*3/uL (ref 0.0–0.1)
Basophils Relative: 0 %
Eosinophils Absolute: 0.1 10*3/uL (ref 0.0–0.5)
Eosinophils Relative: 2 %
HCT: 33 % — ABNORMAL LOW (ref 36.0–46.0)
Hemoglobin: 9.9 g/dL — ABNORMAL LOW (ref 12.0–15.0)
Immature Granulocytes: 0 %
Lymphocytes Relative: 10 %
Lymphs Abs: 0.6 10*3/uL — ABNORMAL LOW (ref 0.7–4.0)
MCH: 29.4 pg (ref 26.0–34.0)
MCHC: 30 g/dL (ref 30.0–36.0)
MCV: 97.9 fL (ref 80.0–100.0)
Monocytes Absolute: 0.4 10*3/uL (ref 0.1–1.0)
Monocytes Relative: 6 %
Neutro Abs: 5.5 10*3/uL (ref 1.7–7.7)
Neutrophils Relative %: 82 %
Platelets: 258 10*3/uL (ref 150–400)
RBC: 3.37 MIL/uL — ABNORMAL LOW (ref 3.87–5.11)
RDW: 12.1 % (ref 11.5–15.5)
WBC: 6.7 10*3/uL (ref 4.0–10.5)
nRBC: 0 % (ref 0.0–0.2)

## 2020-01-14 LAB — BRAIN NATRIURETIC PEPTIDE: B Natriuretic Peptide: 41.1 pg/mL (ref 0.0–100.0)

## 2020-01-14 LAB — COMPREHENSIVE METABOLIC PANEL
ALT: 34 U/L (ref 0–44)
AST: 42 U/L — ABNORMAL HIGH (ref 15–41)
Albumin: 3.6 g/dL (ref 3.5–5.0)
Alkaline Phosphatase: 51 U/L (ref 38–126)
Anion gap: 8 (ref 5–15)
BUN: 28 mg/dL — ABNORMAL HIGH (ref 8–23)
CO2: 32 mmol/L (ref 22–32)
Calcium: 8.9 mg/dL (ref 8.9–10.3)
Chloride: 100 mmol/L (ref 98–111)
Creatinine, Ser: 0.87 mg/dL (ref 0.44–1.00)
GFR, Estimated: >60 mL/min (ref >60)
Glucose, Bld: 261 mg/dL — ABNORMAL HIGH (ref 70–99)
Potassium: 4.8 mmol/L (ref 3.5–5.1)
Sodium: 140 mmol/L (ref 135–145)
Total Bilirubin: 0.5 mg/dL (ref 0.3–1.2)
Total Protein: 6.1 g/dL — ABNORMAL LOW (ref 6.5–8.1)

## 2020-01-14 LAB — TROPONIN I (HIGH SENSITIVITY): Troponin I (High Sensitivity): 8 ng/L (ref <18)

## 2020-01-14 LAB — RESPIRATORY PANEL BY RT PCR (FLU A&B, COVID)
Influenza A by PCR: NEGATIVE
Influenza B by PCR: NEGATIVE
SARS Coronavirus 2 by RT PCR: NEGATIVE

## 2020-01-14 MED ORDER — ALBUTEROL SULFATE HFA 108 (90 BASE) MCG/ACT IN AERS
4.0000 | INHALATION_SPRAY | Freq: Once | RESPIRATORY_TRACT | Status: AC
  Administered 2020-01-14: 4 via RESPIRATORY_TRACT
  Filled 2020-01-14: qty 6.7

## 2020-01-14 MED ORDER — IPRATROPIUM-ALBUTEROL 0.5-2.5 (3) MG/3ML IN SOLN
3.0000 mL | Freq: Once | RESPIRATORY_TRACT | Status: AC
  Administered 2020-01-15: 3 mL via RESPIRATORY_TRACT
  Filled 2020-01-14: qty 3

## 2020-01-14 NOTE — ED Provider Notes (Signed)
[facility] EMERGENCY DEPARTMENT Provider Note   CSN: [id number] Arrival date & time: 01/14/20  1920     History Chief Complaint  Patient presents with  . Shortness of Breath    Heidi Moran is a 84 y.o. female.  Patient is an 84 year old female with past medical history of rheumatoid pulmonary nodules, diabetes, hypertension, hyperlipidemia.  She presents today for evaluation of shortness of breath.  This has worsened over the past several days.  Patient is on home oxygen at 2 L by nasal cannula.  She denies to me she is experiencing any chest pain, fevers, or chills.  She denies any leg swelling.  The history is provided by the patient.  Shortness of Breath Severity:  Moderate Onset quality:  Gradual Timing:  Constant Progression:  Worsening Chronicity:  Recurrent Relieved by:  Nothing Worsened by:  Nothing      Past Medical History:  Diagnosis Date  . Anemia   . Diabetes mellitus without complication ([place])   . Eczema   . Hyperlipidemia   . Hypertension   . Osteoarthritis   . Renal disorder     Patient Active Problem List   Diagnosis Date Noted  . DM (diabetes mellitus) type 2, uncontrolled, with ketoacidosis ([place]) 05/18/2017  . HTN (hypertension) 05/18/2017  . Hyperlipidemia 05/18/2017  . Acute respiratory failure with hypoxia ([place]) 05/17/2017    History reviewed. No pertinent surgical history.   OB History   No obstetric history on file.     History reviewed. No pertinent family history.  Social History   Tobacco Use  . Smoking status: Never Smoker  . Smokeless tobacco: Never Used  Substance Use Topics  . Alcohol use: No  . Drug use: No    Home Medications Prior to Admission medications   Medication Sig Start Date End Date Taking? Authorizing Provider  atorvastatin (LIPITOR) 20 MG tablet Take 20 mg by mouth daily.    [provider]  Calcium-Vitamin D-Vitamin K (VIACTIV PO) Take 1 tablet by mouth daily.     [provider]  famotidine (PEPCID) 20 MG tablet Take 1 tablet (20 mg total) by mouth at bedtime. 06/24/19   Mannam, Hart Robinsons, MD  glyBURIDE-metformin (GLUCOVANCE) 2.5-500 MG tablet Take 1 tablet by mouth daily with breakfast. Pt takes one in the morning with breakfast and one in the evening    [provider]  lisinopril (PRINIVIL,ZESTRIL) 10 MG tablet Take 10 mg by mouth daily.    [provider]  OVER THE COUNTER MEDICATION     [provider]    Allergies    Patient has no known allergies.  Review of Systems   Review of Systems  Respiratory: Positive for shortness of breath.   All other systems reviewed and are negative.   Physical Exam Updated Vital Signs BP (!) 144/70 (BP Location: Right Arm)   Pulse 93   Temp 97.9 F (36.6 C) (Oral)   Resp 18   Ht 4\' 11"  (1.499 m)   Wt 45.5 kg   SpO2 100%   BMI 20.28 kg/m   Physical Exam Vitals and nursing note reviewed.  Constitutional:      General: She is not in acute distress.    Appearance: She is well-developed. She is not diaphoretic.  HENT:     Head: Normocephalic and atraumatic.  Cardiovascular:     Rate and Rhythm: Normal rate and regular rhythm.     Heart sounds: No murmur heard.  No friction rub.  No gallop.   Pulmonary:     Effort: Pulmonary effort is normal. No respiratory distress.     Breath sounds: Normal breath sounds. No wheezing.  Abdominal:     General: Bowel sounds are normal. There is no distension.     Palpations: Abdomen is soft.     Tenderness: There is no abdominal tenderness.  Musculoskeletal:        General: Normal range of motion.     Cervical back: Normal range of motion and neck supple.  Skin:    General: Skin is warm and dry.  Neurological:     Mental Status: She is alert and oriented to person, place, and time.     ED Results / Procedures / Treatments   Labs (all labs ordered are listed, but only abnormal results are displayed) Labs Reviewed    RESPIRATORY PANEL BY RT PCR (FLU A&B, COVID)  CBC WITH DIFFERENTIAL/PLATELET  BRAIN NATRIURETIC PEPTIDE  COMPREHENSIVE METABOLIC PANEL  TROPONIN I (HIGH SENSITIVITY)    EKG EKG Interpretation  Date/Time:  Friday January 14 2020 19:21:21 EDT Ventricular Rate:  85 PR Interval:    QRS Duration: 95 QT Interval:  355 QTC Calculation: 423 R Axis:   -30 Text Interpretation: Sinus rhythm Left axis deviation No significant change since 04/20/2017 Confirmed by Veryl Speak [phone]) on 01/14/2020 7:50:55 PM   Radiology No results found.  Procedures Procedures (including critical care time)  Medications Ordered in ED Medications  albuterol (VENTOLIN HFA) 108 (90 Base) MCG/ACT inhaler 4 puff (has no administration in time range)    ED Course  I have reviewed the triage vital signs and the nursing notes.  Pertinent labs & imaging results that were available during my care of the patient were reviewed by me and considered in my medical decision making (see chart for details).    MDM Rules/Calculators/A&P  Patient with history of suspected COPD on home oxygen presenting with complaints of weakness and difficulty breathing.  Her symptoms appear to be related to an exacerbation of COPD.  She received steroids by EMS and albuterol here in the ER.  Patient continues to complain of severe weakness and does not feel as though she can safely go home.  The remainder of her work-up shows no evidence for pneumonia, pneumothorax, congestive heart failure, or other acute abnormality that would contribute to her dyspnea.  I have discussed the care with Dr. Cyd Silence from the hospitalist service who agrees to admit.  Final Clinical Impression(s) / ED Diagnoses Final diagnoses:  None    Rx / DC Orders ED Discharge Orders    None       Veryl Speak, MD 01/14/20 2350

## 2020-01-15 ENCOUNTER — Encounter (HOSPITAL_COMMUNITY): Payer: Self-pay | Admitting: Internal Medicine

## 2020-01-15 ENCOUNTER — Observation Stay (HOSPITAL_COMMUNITY): Payer: Medicare Other

## 2020-01-15 DIAGNOSIS — J441 Chronic obstructive pulmonary disease with (acute) exacerbation: Secondary | ICD-10-CM | POA: Diagnosis present

## 2020-01-15 DIAGNOSIS — D649 Anemia, unspecified: Secondary | ICD-10-CM | POA: Diagnosis present

## 2020-01-15 DIAGNOSIS — E782 Mixed hyperlipidemia: Secondary | ICD-10-CM | POA: Diagnosis present

## 2020-01-15 DIAGNOSIS — Z20822 Contact with and (suspected) exposure to covid-19: Secondary | ICD-10-CM | POA: Diagnosis present

## 2020-01-15 DIAGNOSIS — T380X5A Adverse effect of glucocorticoids and synthetic analogues, initial encounter: Secondary | ICD-10-CM | POA: Diagnosis not present

## 2020-01-15 DIAGNOSIS — R531 Weakness: Secondary | ICD-10-CM | POA: Diagnosis not present

## 2020-01-15 DIAGNOSIS — L309 Dermatitis, unspecified: Secondary | ICD-10-CM | POA: Diagnosis present

## 2020-01-15 DIAGNOSIS — E876 Hypokalemia: Secondary | ICD-10-CM | POA: Diagnosis not present

## 2020-01-15 DIAGNOSIS — R0602 Shortness of breath: Secondary | ICD-10-CM | POA: Diagnosis not present

## 2020-01-15 DIAGNOSIS — J9611 Chronic respiratory failure with hypoxia: Secondary | ICD-10-CM | POA: Diagnosis present

## 2020-01-15 DIAGNOSIS — Z79899 Other long term (current) drug therapy: Secondary | ICD-10-CM | POA: Diagnosis not present

## 2020-01-15 DIAGNOSIS — Z7984 Long term (current) use of oral hypoglycemic drugs: Secondary | ICD-10-CM | POA: Diagnosis not present

## 2020-01-15 DIAGNOSIS — M1990 Unspecified osteoarthritis, unspecified site: Secondary | ICD-10-CM | POA: Diagnosis present

## 2020-01-15 DIAGNOSIS — I10 Essential (primary) hypertension: Secondary | ICD-10-CM | POA: Diagnosis not present

## 2020-01-15 DIAGNOSIS — Z9981 Dependence on supplemental oxygen: Secondary | ICD-10-CM | POA: Diagnosis not present

## 2020-01-15 DIAGNOSIS — E1169 Type 2 diabetes mellitus with other specified complication: Secondary | ICD-10-CM | POA: Diagnosis not present

## 2020-01-15 DIAGNOSIS — I119 Hypertensive heart disease without heart failure: Secondary | ICD-10-CM | POA: Diagnosis present

## 2020-01-15 DIAGNOSIS — I5031 Acute diastolic (congestive) heart failure: Secondary | ICD-10-CM | POA: Diagnosis present

## 2020-01-15 DIAGNOSIS — M069 Rheumatoid arthritis, unspecified: Secondary | ICD-10-CM | POA: Diagnosis present

## 2020-01-15 DIAGNOSIS — E875 Hyperkalemia: Secondary | ICD-10-CM | POA: Diagnosis not present

## 2020-01-15 DIAGNOSIS — E1165 Type 2 diabetes mellitus with hyperglycemia: Secondary | ICD-10-CM | POA: Diagnosis present

## 2020-01-15 DIAGNOSIS — K219 Gastro-esophageal reflux disease without esophagitis: Secondary | ICD-10-CM | POA: Diagnosis present

## 2020-01-15 LAB — CBC WITH DIFFERENTIAL/PLATELET
Abs Immature Granulocytes: 0.04 10*3/uL (ref 0.00–0.07)
Basophils Absolute: 0 10*3/uL (ref 0.0–0.1)
Basophils Relative: 0 %
Eosinophils Absolute: 0 10*3/uL (ref 0.0–0.5)
Eosinophils Relative: 0 %
HCT: 33.9 % — ABNORMAL LOW (ref 36.0–46.0)
Hemoglobin: 10.3 g/dL — ABNORMAL LOW (ref 12.0–15.0)
Immature Granulocytes: 0 %
Lymphocytes Relative: 2 %
Lymphs Abs: 0.2 10*3/uL — ABNORMAL LOW (ref 0.7–4.0)
MCH: 29.7 pg (ref 26.0–34.0)
MCHC: 30.4 g/dL (ref 30.0–36.0)
MCV: 97.7 fL (ref 80.0–100.0)
Monocytes Absolute: 0.1 10*3/uL (ref 0.1–1.0)
Monocytes Relative: 1 %
Neutro Abs: 8.8 10*3/uL — ABNORMAL HIGH (ref 1.7–7.7)
Neutrophils Relative %: 97 %
Platelets: 291 10*3/uL (ref 150–400)
RBC: 3.47 MIL/uL — ABNORMAL LOW (ref 3.87–5.11)
RDW: 12.2 % (ref 11.5–15.5)
WBC: 9.1 10*3/uL (ref 4.0–10.5)
nRBC: 0 % (ref 0.0–0.2)

## 2020-01-15 LAB — COMPREHENSIVE METABOLIC PANEL
ALT: 36 U/L (ref 0–44)
AST: 44 U/L — ABNORMAL HIGH (ref 15–41)
Albumin: 3.8 g/dL (ref 3.5–5.0)
Alkaline Phosphatase: 51 U/L (ref 38–126)
Anion gap: 12 (ref 5–15)
BUN: 27 mg/dL — ABNORMAL HIGH (ref 8–23)
CO2: 28 mmol/L (ref 22–32)
Calcium: 9.1 mg/dL (ref 8.9–10.3)
Chloride: 96 mmol/L — ABNORMAL LOW (ref 98–111)
Creatinine, Ser: 1.02 mg/dL — ABNORMAL HIGH (ref 0.44–1.00)
GFR, Estimated: 55 mL/min — ABNORMAL LOW (ref >60)
Glucose, Bld: 438 mg/dL — ABNORMAL HIGH (ref 70–99)
Potassium: 5 mmol/L (ref 3.5–5.1)
Sodium: 136 mmol/L (ref 135–145)
Total Bilirubin: 0.5 mg/dL (ref 0.3–1.2)
Total Protein: 7 g/dL (ref 6.5–8.1)

## 2020-01-15 LAB — MAGNESIUM: Magnesium: 1.5 mg/dL — ABNORMAL LOW (ref 1.7–2.4)

## 2020-01-15 LAB — URINALYSIS, COMPLETE (UACMP) WITH MICROSCOPIC
Bilirubin Urine: NEGATIVE
Glucose, UA: 50 mg/dL — AB
Ketones, ur: NEGATIVE mg/dL
Nitrite: NEGATIVE
Protein, ur: NEGATIVE mg/dL
Specific Gravity, Urine: 1.005 (ref 1.005–1.030)
pH: 5 (ref 5.0–8.0)

## 2020-01-15 LAB — HEMOGLOBIN A1C
Hgb A1c MFr Bld: 7.1 % — ABNORMAL HIGH (ref 4.8–5.6)
Mean Plasma Glucose: 157.07 mg/dL

## 2020-01-15 LAB — ECHOCARDIOGRAM COMPLETE
AR max vel: 1.61 cm2
AV Area VTI: 1.84 cm2
AV Area mean vel: 1.57 cm2
AV Mean grad: 4 mmHg
AV Peak grad: 7 mmHg
Ao pk vel: 1.32 m/s
Area-P 1/2: 2.29 cm2
Height: 59 in
S' Lateral: 2.1 cm
Weight: 1606.4 oz

## 2020-01-15 LAB — GLUCOSE, CAPILLARY
Glucose-Capillary: 339 mg/dL — ABNORMAL HIGH (ref 70–99)
Glucose-Capillary: 279 mg/dL — ABNORMAL HIGH (ref 70–99)
Glucose-Capillary: 149 mg/dL — ABNORMAL HIGH (ref 70–99)
Glucose-Capillary: 282 mg/dL — ABNORMAL HIGH (ref 70–99)

## 2020-01-15 LAB — LACTIC ACID, PLASMA: Lactic Acid, Venous: 0.9 mmol/L (ref 0.5–1.9)

## 2020-01-15 LAB — D-DIMER, QUANTITATIVE: D-Dimer, Quant: 0.62 ug/mL-FEU — ABNORMAL HIGH (ref 0.00–0.50)

## 2020-01-15 LAB — HIV ANTIBODY (ROUTINE TESTING W REFLEX): HIV Screen 4th Generation wRfx: NONREACTIVE

## 2020-01-15 LAB — TROPONIN I (HIGH SENSITIVITY): Troponin I (High Sensitivity): 8 ng/L (ref <18)

## 2020-01-15 MED ORDER — ATORVASTATIN CALCIUM 10 MG PO TABS
20.0000 mg | ORAL_TABLET | Freq: Every day | ORAL | Status: DC
  Administered 2020-01-15: 20 mg via ORAL
  Filled 2020-01-15: qty 2

## 2020-01-15 MED ORDER — FUROSEMIDE 10 MG/ML IJ SOLN
20.0000 mg | Freq: Once | INTRAMUSCULAR | Status: AC
  Administered 2020-01-15: 20 mg via INTRAVENOUS
  Filled 2020-01-15: qty 2

## 2020-01-15 MED ORDER — UMECLIDINIUM-VILANTEROL 62.5-25 MCG/INH IN AEPB
1.0000 | INHALATION_SPRAY | Freq: Every day | RESPIRATORY_TRACT | 1 refills | Status: DC
Start: 2020-01-15 — End: 2020-03-08

## 2020-01-15 MED ORDER — METHYLPREDNISOLONE SODIUM SUCC 40 MG IJ SOLR
40.0000 mg | Freq: Two times a day (BID) | INTRAMUSCULAR | Status: DC
  Administered 2020-01-15 – 2020-01-16 (×3): 40 mg via INTRAVENOUS
  Filled 2020-01-15 (×3): qty 1

## 2020-01-15 MED ORDER — INSULIN ASPART 100 UNIT/ML ~~LOC~~ SOLN
0.0000 [IU] | Freq: Three times a day (TID) | SUBCUTANEOUS | Status: DC
  Administered 2020-01-15: 2 [IU] via SUBCUTANEOUS
  Administered 2020-01-15 – 2020-01-16 (×2): 8 [IU] via SUBCUTANEOUS
  Administered 2020-01-16: 5 [IU] via SUBCUTANEOUS

## 2020-01-15 MED ORDER — SODIUM CHLORIDE 0.9 % IV SOLN
INTRAVENOUS | Status: DC | PRN
  Administered 2020-01-15: 250 mL via INTRAVENOUS

## 2020-01-15 MED ORDER — ALBUTEROL SULFATE HFA 108 (90 BASE) MCG/ACT IN AERS
2.0000 | INHALATION_SPRAY | Freq: Four times a day (QID) | RESPIRATORY_TRACT | Status: DC
  Administered 2020-01-15 (×3): 2 via RESPIRATORY_TRACT
  Filled 2020-01-15: qty 6.7

## 2020-01-15 MED ORDER — LISINOPRIL 10 MG PO TABS
10.0000 mg | ORAL_TABLET | Freq: Every day | ORAL | Status: DC
  Administered 2020-01-15: 10 mg via ORAL
  Filled 2020-01-15: qty 1

## 2020-01-15 MED ORDER — ENOXAPARIN SODIUM 40 MG/0.4ML ~~LOC~~ SOLN
40.0000 mg | SUBCUTANEOUS | Status: DC
  Administered 2020-01-15 – 2020-01-16 (×2): 40 mg via SUBCUTANEOUS
  Filled 2020-01-15 (×2): qty 0.4

## 2020-01-15 MED ORDER — INSULIN ASPART 100 UNIT/ML ~~LOC~~ SOLN
0.0000 [IU] | Freq: Three times a day (TID) | SUBCUTANEOUS | Status: DC
  Administered 2020-01-15: 11 [IU] via SUBCUTANEOUS

## 2020-01-15 MED ORDER — MAGNESIUM SULFATE 2 GM/50ML IV SOLN
2.0000 g | Freq: Once | INTRAVENOUS | Status: AC
  Administered 2020-01-15: 2 g via INTRAVENOUS
  Filled 2020-01-15: qty 50

## 2020-01-15 MED ORDER — INSULIN ASPART 100 UNIT/ML ~~LOC~~ SOLN
4.0000 [IU] | Freq: Three times a day (TID) | SUBCUTANEOUS | Status: DC
  Administered 2020-01-15 – 2020-01-16 (×3): 4 [IU] via SUBCUTANEOUS

## 2020-01-15 MED ORDER — UMECLIDINIUM-VILANTEROL 62.5-25 MCG/INH IN AEPB
1.0000 | INHALATION_SPRAY | Freq: Every day | RESPIRATORY_TRACT | Status: DC
  Administered 2020-01-16: 08:00:00 1 via RESPIRATORY_TRACT
  Filled 2020-01-15: qty 14

## 2020-01-15 MED ORDER — ADULT MULTIVITAMIN W/MINERALS CH
1.0000 | ORAL_TABLET | Freq: Every day | ORAL | Status: DC
  Administered 2020-01-15 – 2020-01-16 (×2): 1 via ORAL
  Filled 2020-01-15 (×2): qty 1

## 2020-01-15 MED ORDER — GLUCERNA SHAKE PO LIQD
237.0000 mL | Freq: Three times a day (TID) | ORAL | Status: DC
  Administered 2020-01-15 – 2020-01-16 (×5): 237 mL via ORAL

## 2020-01-15 MED ORDER — ALBUTEROL SULFATE HFA 108 (90 BASE) MCG/ACT IN AERS: 2.0000 | INHALATION_SPRAY | Freq: Four times a day (QID) | RESPIRATORY_TRACT | 1 refills | Status: DC | PRN

## 2020-01-15 MED ORDER — INSULIN ASPART 100 UNIT/ML ~~LOC~~ SOLN
0.0000 [IU] | Freq: Every day | SUBCUTANEOUS | Status: DC
  Administered 2020-01-15: 3 [IU] via SUBCUTANEOUS

## 2020-01-15 MED ORDER — FAMOTIDINE 20 MG PO TABS
20.0000 mg | ORAL_TABLET | Freq: Every day | ORAL | Status: DC
  Administered 2020-01-15: 20 mg via ORAL
  Filled 2020-01-15: qty 1

## 2020-01-15 MED ORDER — ALBUTEROL SULFATE HFA 108 (90 BASE) MCG/ACT IN AERS
2.0000 | INHALATION_SPRAY | RESPIRATORY_TRACT | Status: DC | PRN
  Administered 2020-01-16: 2 via RESPIRATORY_TRACT

## 2020-01-15 MED ORDER — PANTOPRAZOLE SODIUM 40 MG PO TBEC
40.0000 mg | DELAYED_RELEASE_TABLET | Freq: Every day | ORAL | Status: DC
  Administered 2020-01-15 – 2020-01-16 (×2): 40 mg via ORAL
  Filled 2020-01-15 (×2): qty 1

## 2020-01-15 NOTE — Progress Notes (Signed)
Initial Nutrition Assessment  RD working remotely.  DOCUMENTATION CODES:   Not applicable  INTERVENTION:   -Glucerna Shake po TID, each supplement provides 220 kcal and 10 grams of protein -MVI with minerals daily  NUTRITION DIAGNOSIS:   Increased nutrient needs related to chronic illness (COPD) as evidenced by estimated needs.  GOAL:   Patient will meet greater than or equal to 90% of their needs  MONITOR:   PO intake, Supplement acceptance, Labs, Weight trends, Skin, I & O's  REASON FOR ASSESSMENT:   Consult Assessment of nutrition requirement/status  ASSESSMENT:   84 year old female with past medical history of COPD (PFT's in 2019 - FEV 1 28%, severe obstruction), chronic respiratory failure (on 2lpm [place] at home at all times), hyperlipidemia, gastroesophageal reflux disease, rheumatoid arthritis, rheumatoid pulmonary nodules who presents to [facility] emergency room with complaints of shortness of breath.  Pt admitted with COPD exacerbation.   Reviewed I/O's: +119 ml x 24 hours  Attempted to speak with pt via call to hospital room phone, however, unable to reach.   No meal completion records currently available at this time.   Reviewed wt hx; pt has experienced a 9.5% wt loss over the past year. While this is not significant for time frame, it is concerning given advanced age and multiple cor-morbidities.   Medications reviewed and include IV solu-medrol.  Lab Results  Component Value Date   HGBA1C 7.1 (H) 01/15/2020   PTA DM medications are 2.5-500 mg glyburide-metformin BID. Per ADA's Standards of Medical Care for Diabetes, glycemic targets for elderly patients who are otherwise healthy (few medical impairments) and cognitively intact should be less stringent (Hgb A1c <7.5).    Labs reviewed: CBGS: 339 (inpatient orders for glycemic control are 0-15 units insulin aspart TID with meals, 0-5 units insulin aspart daily at bedtime, and 4 units insulin  aspart TID with meals).   Diet Order:   Diet Order            Diet heart healthy/carb modified Room service appropriate? Yes; Fluid consistency: Thin  Diet effective now                 EDUCATION NEEDS:   No education needs have been identified at this time  Skin:  Skin Assessment: Reviewed RN Assessment  Last BM:  01/15/20  Height:   Ht Readings from Last 1 Encounters:  01/14/20 4\' 11"  (1.499 m)    Weight:   Wt Readings from Last 1 Encounters:  01/14/20 45.5 kg    Ideal Body Weight:  44.5 kg  BMI:  Body mass index is 20.28 kg/m.  Estimated Nutritional Needs:   Kcal:  1350-1550  Protein:  55-70 grams  Fluid:  > 1.3 L    Loistine Chance, RD, LDN, [place] Registered Dietitian II Certified Diabetes Care and Education Specialist Please refer to [facility] for RD and/or RD on-call/weekend/after hours pager

## 2020-01-15 NOTE — ED Notes (Signed)
Report given to floor Jordan Hawks, RN

## 2020-01-15 NOTE — Progress Notes (Signed)
SATURATION QUALIFICATIONS: (This note is used to comply with regulatory documentation for home oxygen)  Patient Saturations on Room Air at Rest = 98%  Patient Saturations on Room Air while Ambulating = 94%  Patient Saturations on 2 Liters of oxygen while Ambulating = 100%  Please briefly explain why patient needs home oxygen:  Pt ambulated 75 ft without assistive device. Pt states she feels short of breath on room air and develops a dry cough. Pt sitting at edge of bed at this time on 2L [place]. Will continue to monitor

## 2020-01-15 NOTE — Progress Notes (Signed)
  Echocardiogram 2D Echocardiogram has been performed.  Heidi Moran 01/15/2020, 9:40 AM

## 2020-01-15 NOTE — H&P (Signed)
History and Physical    Heidi Moran WIO:[id number] DOB: [birth year] DOA: 01/14/2020  PCP: Burnard Bunting, MD  Patient coming from: home  Chief Complaint:  Chief Complaint  Patient presents with   Shortness of Breath     HPI:   84 year old female with past medical history of COPD (PFT's in 2019 - FEV 1 28%, severe obstruction), chronic respiratory failure (on 2lpm [place] at home at all times), hyperlipidemia, gastroesophageal reflux disease, rheumatoid arthritis, rheumatoid pulmonary nodules who presents to [facility] emergency room with complaints of shortness of breath.  Patient is a somewhat poor historian and some of the history has been supplemented by the husband who is sitting at the bedside.  Patient explains that this evening she has become substantially more short of breath than her baseline.  Patient is currently short of breath at rest, moderate to severe in intensity, worse with any exertion whatsoever.  Patient complains of associated generalized weakness and poor appetite.  Patient denies any associated cough, fever, chest pain sick contacts, recent travel or confirmed contact with COVID-19 infection.  Upon further questioning patient denies paroxysmal nocturnal dyspnea, pillow orthopnea or new leg swelling.  Patient shortness of breath continued to persist over the course of several hours until EMS was contacted and the patient was brought into [facility] emergency department for evaluation.  Of note, the husband states that the patient's baseline functional status and shortness of breath have gradually declined over the past several months the patient now becoming short of breath after walking approximately 10 feet even when she is not sick.  Upon evaluation in the emergency department, patient continued to experience symptoms of shortness of breath and weakness with reported difficulty with ambulation despite several rounds of bronchodilator therapy being  provided.  Because of patient's ongoing symptoms and concerns for COPD exacerbation the hospitalist group has been called to assess the patient for admission to the hospital.  Review of Systems:   Review of Systems  Constitutional: Positive for malaise/fatigue.  Respiratory: Positive for shortness of breath.   Neurological: Positive for weakness.  All other systems reviewed and are negative.   Past Medical History:  Diagnosis Date   Anemia    Diabetes mellitus without complication ([place])    Eczema    Hyperlipidemia    Hypertension    Osteoarthritis    Renal disorder     History reviewed. No pertinent surgical history.   reports that she has never smoked. She has never used smokeless tobacco. She reports that she does not drink alcohol and does not use drugs.  No Known Allergies  Family History  Problem Relation Age of Onset   Heart disease Neg Hx      Prior to Admission medications   Medication Sig Start Date End Date Taking? Authorizing Provider  atorvastatin (LIPITOR) 20 MG tablet Take 20 mg by mouth daily.   Yes [provider]  Calcium-Vitamin D-Vitamin K (VIACTIV PO) Take 1 tablet by mouth daily.   Yes [provider]  famotidine (PEPCID) 20 MG tablet Take 1 tablet (20 mg total) by mouth at bedtime. 06/24/19  Yes Mannam, Praveen, MD  glyBURIDE-metformin (GLUCOVANCE) 2.5-500 MG tablet Take 1 tablet by mouth daily with breakfast. Pt takes one in the morning with breakfast and one in the evening   Yes [provider]  lisinopril (PRINIVIL,ZESTRIL) 10 MG tablet Take 10 mg by mouth daily.   Yes [provider]  OVER THE COUNTER MEDICATION  Yes [provider]  OXYGEN Inhale 2 L/day into the lungs daily.   Yes [provider]    Physical Exam: Vitals:   01/14/20 2300 01/14/20 2313 01/15/20 0215 01/15/20 0230  BP: (!) 113/47 (!) 128/56 (!) 132/53 130/61  Pulse: 76 84 93 89  Resp: 20 (!) 22 (!) 24 (!) 24    Temp:  98.5 F (36.9 C)    TempSrc:  Oral    SpO2: 98% 96% 96% 99%  Weight:      Height:        Constitutional: Acute alert and oriented x3, no associated distress.   Skin: no rashes, no lesions, good skin turgor noted. Eyes: Pupils are equally reactive to light.  No evidence of scleral icterus or conjunctival pallor.  ENMT: Moist mucous membranes noted.  Posterior pharynx clear of any exudate or lesions.   Neck: normal, supple, no masses, no thyromegaly.  No evidence of jugular venous distension.   Respiratory: Diminished breath sounds in all fields with scattered rhonchi bilaterally and bibasilar rales.  No evidence of wheezing.  Patient is notably tachypneic without accessory muscle use.  Cardiovascular: Regular rate and rhythm, no murmurs / rubs / gallops.  Trace distal bilateral lower extremity pitting edema. 2+ pedal pulses. No carotid bruits.  Chest:   Nontender without crepitus or deformity.   Back:   Nontender without crepitus or deformity. Abdomen: Abdomen is soft and nontender.  No evidence of intra-abdominal masses.  Positive bowel sounds noted in all quadrants.   Musculoskeletal: No joint deformity upper and lower extremities. Good ROM, no contractures. Normal muscle tone.  Neurologic: CN 2-12 grossly intact. Sensation intact.  Patient moving all 4 extremities spontaneously.  Patient is following all commands.  Patient is responsive to verbal stimuli.   Psychiatric: Patient exhibits normal mood with appropriate affect.  Patient seems to possess insight as to their current situation.     Labs on Admission: I have personally reviewed following labs and imaging studies -   CBC: Recent Labs  Lab 01/14/20 2003  WBC 6.7  NEUTROABS 5.5  HGB 9.9*  HCT 33.0*  MCV 97.9  PLT 161   Basic Metabolic Panel: Recent Labs  Lab 01/14/20 2003  NA 140  K 4.8  CL 100  CO2 32  GLUCOSE 261*  BUN 28*  CREATININE 0.87  CALCIUM 8.9   GFR: Estimated Creatinine Clearance: 34  mL/min (by C-G formula based on SCr of 0.87 mg/dL). Liver Function Tests: Recent Labs  Lab 01/14/20 2003  AST 42*  ALT 34  ALKPHOS 51  BILITOT 0.5  PROT 6.1*  ALBUMIN 3.6   No results for input(s): LIPASE, AMYLASE in the last 168 hours. No results for input(s): AMMONIA in the last 168 hours. Coagulation Profile: No results for input(s): INR, PROTIME in the last 168 hours. Cardiac Enzymes: No results for input(s): CKTOTAL, CKMB, CKMBINDEX, TROPONINI in the last 168 hours. BNP (last 3 results) No results for input(s): PROBNP in the last 8760 hours. HbA1C: Recent Labs    01/15/20 0110  HGBA1C 7.1*   CBG: No results for input(s): GLUCAP in the last 168 hours. Lipid Profile: No results for input(s): CHOL, HDL, LDLCALC, TRIG, CHOLHDL, LDLDIRECT in the last 72 hours. Thyroid Function Tests: No results for input(s): TSH, T4TOTAL, FREET4, T3FREE, THYROIDAB in the last 72 hours. Anemia Panel: No results for input(s): VITAMINB12, FOLATE, FERRITIN, TIBC, IRON, RETICCTPCT in the last 72 hours. Urine analysis:    Component Value Date/Time   COLORURINE  YELLOW 05/18/2017 0641   APPEARANCEUR CLOUDY (A) 05/18/2017 0641   LABSPEC 1.026 05/18/2017 0641   PHURINE 5.0 05/18/2017 0641   GLUCOSEU 50 (A) 05/18/2017 0641   HGBUR MODERATE (A) 05/18/2017 0641   BILIRUBINUR NEGATIVE 05/18/2017 0641   KETONESUR 5 (A) 05/18/2017 0641   PROTEINUR 30 (A) 05/18/2017 0641   UROBILINOGEN 0.2 01/28/2017 1310   NITRITE NEGATIVE 05/18/2017 0641   LEUKOCYTESUR TRACE (A) 05/18/2017 0641    Radiological Exams on Admission - Personally Reviewed: [facility] Chest Port 1 View  Result Date: 01/14/2020 CLINICAL DATA:  Shortness of breath. EXAM: PORTABLE CHEST 1 VIEW COMPARISON:  05/17/2017 FINDINGS: The cardiac silhouette, mediastinal and hilar contours are within normal limits. There is mild tortuosity and calcification of the thoracic aorta. Mild emphysematous changes and hyperinflation. No infiltrates or  effusions. The bony thorax is intact. IMPRESSION: Emphysematous changes but no acute pulmonary findings. Electronically Signed   By: Marijo Sanes M.D.   On: 01/14/2020 20:30    EKG: Personally reviewed.  Rhythm is normal sinus rhythm with heart rate of 84 bpm.  No dynamic ST segment changes appreciated.  Assessment/Plan Principal Problem:   COPD with acute exacerbation [facility])   Patient presenting with rather rapid onset of shortness of breath  Patient is a known history of advanced COPD with severe obstruction -patient is most likely suffering from a COPD exacerbation.  However, will attempt to identify alternative contributing causes decrease in shortness of breath.  Obtaining BNP, echocardiogram, D-dimer.  Chest x-ray negative for any superimposed pneumonia.  Placing patient on intravenous systemic Solu-Medrol twice daily.  Place patient on scheduled bronchodilator therapy  Considering bibasilar rales on lung examination, will try a trial dose of Lasix.  Continue to provide patient with submental oxygen  Active Problems: Normocytic anemia   No clinical evidence of bleeding  Obtaining iron panel, folate, vitamin B12, hepatic enzymes  Monitor hemoglobin and hematocrit with serial CBCs.   Essential hypertension   Continue home regimen of antihypertensive therapy    Chronic respiratory failure with hypoxia ([place])   Patient has a longstanding known history of chronic respiratory failure on 2 L of oxygen via nasal cannula  Will provide patient with additional supplemental oxygen for bouts of hypoxia    Controlled type 2 diabetes mellitus with hyperglycemia, without long-term current use of insulin (HCC)   Accu-Cheks before every meal and nightly with sliding scale insulin  Hemoglobin A1c pending  Holding home oral hypoglycemics    Mixed diabetic hyperlipidemia associated with type 2 diabetes mellitus ([place])   Continue home regimen of statin therapy    GERD  without esophagitis    Continue home regimen of daily H2 blocker.   Code Status:  Full code Family Communication: Husband is at bedside who has been updated on plan of care.  Status is: Observation  The patient remains OBS appropriate and will d/c before 2 midnights.  Dispo: The patient is from: Home              Anticipated d/c is to: Home              Anticipated d/c date is: 2 days              Patient currently is not medically stable to d/c.        Vernelle Emerald MD [facility] Pager [phone]  If 7PM-7AM, please contact night-coverage [email or web address] Use universal [place] password for that web site. If you do not have the password,  please call the hospital operator.  01/15/2020, 3:10 AM

## 2020-01-15 NOTE — Evaluation (Signed)
Physical Therapy Evaluation and Discharge Patient Details Name: Heidi Moran MRN: [id number] DOB: 12/03/1935 Today's Date: 01/15/2020   History of Present Illness  84 year old female with past medical history of COPD, chronic respiratory failure (on 2lpm [place] at home at all times), hyperlipidemia, gastroesophageal reflux disease, rheumatoid arthritis, rheumatoid pulmonary nodules who presents to [facility] emergency room with complaints of shortness of breath.  Clinical Impression   Patient evaluated by Physical Therapy with no further acute PT needs identified. All education has been completed and the patient has no further questions. She knows (and was able to demonstrate) her HEP from prior round of HHPT (although she admits she does not do it!). Educated on need for HEP to maintain her balance and mobility to reduce risk of falling and potential devastating injury. (Prior to pandemic[facility] [employer]). PT is signing off. Thank you for this referral.     Follow Up Recommendations No PT follow up;Supervision - Intermittent    Equipment Recommendations  None recommended by PT    Recommendations for Other Services OT consult     Precautions / Restrictions Precautions Precautions: Fall Precaution Comments: reports has tripped over one of the cats in the past Restrictions Weight Bearing Restrictions: No      Mobility  Bed Mobility Overal bed mobility: Modified Independent             General bed mobility comments: states at home she uses step stool to get into/out of her bed; has difficulty describing/demonstrating her technique "holds onto side table"    Transfers Overall transfer level: Independent Equipment used: None             General transfer comment: no imbalance noted  Ambulation/Gait Ambulation/Gait assistance: Independent Gait Distance (Feet): 40 Feet Assistive device: None Gait Pattern/deviations: Step-through  pattern;Decreased stride length;Wide base of support        Stairs            Wheelchair Mobility    Modified Rankin (Stroke Patients Only)       Balance   Sitting-balance support: No upper extremity supported;Feet supported Sitting balance-Leahy Scale: Good     Standing balance support: No upper extremity supported Standing balance-Leahy Scale: Good   Single Leg Stance - Right Leg:  (30 sec with ipsilateral UE support on counter) Single Leg Stance - Left Leg:  (30 sec with ipsilateral UE support on counter)     [place] - Eyes Opened: 30 [place] - Eyes Closed: 15 (very slight sway (WNL))                 Pertinent Vitals/Pain Pain Assessment: No/denies pain    Home Living Family/patient expects to be discharged to:: Private residence Living Arrangements: Spouse/significant other Available Help at Discharge: Family;Available 24 hours/day Type of Home: House Home Access: Stairs to enter Entrance Stairs-Rails: Right;Left;Can reach both Entrance Stairs-Number of Steps: 4 Home Layout: One level Home Equipment: Shower seat;Grab bars - tub/shower (keeps seat outside the shower to sit and  dry herself)      Prior Function Level of Independence: Needs assistance   Gait / Transfers Assistance Needed: independent in home; community holds onto her husband's arm  ADL's / Homemaking Assistance Needed: stands in shower with grab bars and showers herself        Hand Dominance   Dominant Hand: Right    Extremity/Trunk Assessment   Upper Extremity Assessment Upper Extremity Assessment: Defer to OT evaluation (+arthritic changes; able to  open containers)    Lower Extremity Assessment Lower Extremity Assessment: Overall WFL for tasks assessed    Cervical / Trunk Assessment Cervical / Trunk Assessment: Kyphotic  Communication   Communication: No difficulties  Cognition Arousal/Alertness: Awake/alert Behavior During Therapy: WFL for tasks  assessed/performed Overall Cognitive Status: Within Functional Limits for tasks assessed                                        General Comments General comments (skin integrity, edema, etc.): Patient reports she considered return to pulmonary rehab [facility], however decided she also did not feel safe going to Pulmonary rehab (with regards to pandemic)    Exercises General Exercises - Lower Extremity Heel Raises: AROM;Strengthening;Both;10 reps (light UE support at counter) Other Exercises Other Exercises: Patient reports she still has her counter exercise sheets from previous episode of HHPT however she no longer does them. Educated this would be a good routine for her to resume. In the least, encouraged her to do SLS at counter with light UE support (ipsilateral to foot she is standing on). Also would be good to use her steps to garage for exercise (ex. up/down x 3 reps)   Assessment/Plan    PT Assessment Patent does not need any further PT services  PT Problem List         PT Treatment Interventions      PT Goals (Current goals can be found in the Care Plan section)  Acute Rehab PT Goals Patient Stated Goal: return to activities in the community (really misses this) PT Goal Formulation: All assessment and education complete, DC therapy    Frequency     Barriers to discharge        Co-evaluation               AM-PAC PT "6 Clicks" Mobility  Outcome Measure Help needed turning from your back to your side while in a flat bed without using bedrails?: None Help needed moving from lying on your back to sitting on the side of a flat bed without using bedrails?: None Help needed moving to and from a bed to a chair (including a wheelchair)?: None Help needed standing up from a chair using your arms (e.g., wheelchair or bedside chair)?: None Help needed to walk in hospital room?: None Help needed climbing 3-5 steps with a railing? :  None 6 Click Score: 24    End of Session Equipment Utilized During Treatment: Gait belt (on room air; RN had done walking sats on RA with >90%) Activity Tolerance: Patient tolerated treatment well (on room air, with talking ) Patient left: in bed;with call bell/phone within reach;with bed alarm set Nurse Communication: Mobility status PT Visit Diagnosis: Muscle weakness (generalized) (M62.81)    Time: 9622-2979 PT Time Calculation (min) (ACUTE ONLY): 35 min   Charges:   PT Evaluation $PT Eval Low Complexity: 1 Low PT Treatments $Therapeutic Exercise: 8-22 mins         Arby Barrette, PT Pager [phone]   Rexanne Mano 01/15/2020, 11:26 AM

## 2020-01-15 NOTE — Evaluation (Signed)
Occupational Therapy Evaluation Patient Details Name: Heidi Moran MRN: [id number] DOB: 1935/08/22 Today's Date: 01/15/2020    History of Present Illness 84 year old female with past medical history of COPD, chronic respiratory failure (on 2lpm [place] at home at all times), hyperlipidemia, gastroesophageal reflux disease, rheumatoid arthritis, rheumatoid pulmonary nodules who presents to [facility] emergency room with complaints of shortness of breath.   Clinical Impression   Patient admitted for the above diagnosis.  She presents with mild SOB with exertion, but is essentially at her baseline.  Her spouse is there to assist as needed, and helps her with community mobility.  No acute or post acute needs with respect to OT.  Discharge is planned for tomorrow if cleared by the MD.      Follow Up Recommendations  No OT follow up    Equipment Recommendations  None recommended by OT    Recommendations for Other Services       Precautions / Restrictions Precautions Precautions: Fall Precaution Comments: reports has tripped over one of the cats in the past Restrictions Weight Bearing Restrictions: No      Mobility Bed Mobility Overal bed mobility: Modified Independent             General bed mobility comments: states at home she uses step stool to get into/out of her bed; has difficulty describing/demonstrating her technique "holds onto side table"    Transfers Overall transfer level: Independent Equipment used: None             General transfer comment: no imbalance noted    Balance   Sitting-balance support: No upper extremity supported;Feet supported Sitting balance-Leahy Scale: Good     Standing balance support: No upper extremity supported Standing balance-Leahy Scale: Good                  ADL either performed or assessed with clinical judgement   ADL Overall ADL's : At baseline                                        General ADL Comments: Patient is able to complete self care, toilet skills and functional in room mobility at baseline.     Vision Baseline Vision/History: Wears glasses Wears Glasses: At all times Patient Visual Report: No change from baseline                  Pertinent Vitals/Pain Pain Assessment: No/denies pain     Hand Dominance Right   Extremity/Trunk Assessment Upper Extremity Assessment Upper Extremity Assessment: Overall WFL for tasks assessed   Lower Extremity Assessment Lower Extremity Assessment: Overall WFL for tasks assessed   Cervical / Trunk Assessment Cervical / Trunk Assessment: Kyphotic   Communication Communication Communication: No difficulties   Cognition Arousal/Alertness: Awake/alert Behavior During Therapy: WFL for tasks assessed/performed Overall Cognitive Status: Within Functional Limits for tasks assessed                                     General Comments  Patient reports she considered return to pulmonary rehab [facility] however decided she also did not feel safe going to Pulmonary rehab (with regards to pandemic)    Exercises Exercises: General Lower Extremity;Other exercises General Exercises - Lower Extremity Heel Raises: AROM;Strengthening;Both;10 reps (light UE  support at counter) Other Exercises Other Exercises: Patient reports she still has her counter exercise sheets from previous episode of HHPT however she no longer does them. Educated this would be a good routine for her to resume. In the least, encouraged her to do SLS at counter with light UE support (ipsilateral to foot she is standing on). Also would be good to use her steps to garage for exercise (ex. up/down x 3 reps)   Shoulder Instructions      Home Living Family/patient expects to be discharged to:: Private residence Living Arrangements: Spouse/significant other Available Help at Discharge: Family;Available 24  hours/day Type of Home: House Home Access: Stairs to enter [employer] of Steps: 4 Entrance Stairs-Rails: Right;Left;Can reach both Home Layout: One level     Bathroom Shower/Tub: Occupational psychologist: Standard     Home Equipment: Shower seat;Grab bars - tub/shower (keeps seat outside the shower to sit and  dry herself)          Prior Functioning/Environment Level of Independence: Needs assistance  Gait / Transfers Assistance Needed: independent in home; community holds onto her husband's arm ADL's / Homemaking Assistance Needed: stands in shower with grab bars and showers herself            OT Problem List: Decreased activity tolerance      OT Treatment/Interventions:      OT Goals(Current goals can be found in the care plan section)   OT Frequency:     Barriers to D/C:  none noted          Co-evaluation              AM-PAC OT "6 Clicks" Daily Activity     Outcome Measure Help from another person eating meals?: None Help from another person taking care of personal grooming?: None Help from another person toileting, which includes using toliet, bedpan, or urinal?: None Help from another person bathing (including washing, rinsing, drying)?: None Help from another person to put on and taking off regular upper body clothing?: None Help from another person to put on and taking off regular lower body clothing?: None 6 Click Score: 24   End of Session Equipment Utilized During Treatment: Oxygen Nurse Communication: Mobility status  Activity Tolerance: Patient tolerated treatment well Patient left: in bed;with call bell/phone within reach;with bed alarm set  OT Visit Diagnosis: Unsteadiness on feet (R26.81)                Time: 1610-9604 OT Time Calculation (min): 24 min Charges:  OT General Charges $OT Visit: 1 Visit OT Evaluation $OT Eval Moderate Complexity: 1 Mod  01/15/2020  Rich, OTR/L  Acute Rehabilitation  Services  Office:  [phone]   Metta Clines 01/15/2020, 12:10 PM

## 2020-01-15 NOTE — Progress Notes (Signed)
PROGRESS NOTE  Heidi Moran KYH:[id number] DOB: December 14, 1935   PCP: Burnard Bunting, MD  Patient is from: Home  DOA: 01/14/2020 LOS: 0  Chief complaints: Shortness of breath  Brief Narrative / Interim history: 84 year old female with history of COPD (FEV1/FVC of 48%) not on meds, chronic RF on 2 L, RA with pulmonary nodules, HLD and GERD presenting with shortness of breath, generalized weakness and poor appetite and admitted for COPD exacerbation.   In ED, hemodynamically stable.  Saturating in upper 90s to 100% on 2 L.  BMP glucose 261.  Hgb 9.9.  High-sensitivity troponin 8> 8.  BNP 41.  Influenza and COVID-19 PCR negative.  EKG NSR without acute ischemic finding.  CXR with emphysematous change.  D-dimer 0.62.  UA without significant finding. Admitted with working diagnosis of COPD exacerbation.  Started on steroids and breathing treatments.  Subjective: Seen and examined earlier this morning.  Reports improvement in her breathing but not quite close to her baseline.  She is very anxious about going home.  Denies chest pain.  Reports some cough.  She says she is allergic to cats but lives with 3 cats.  Also some mold exposure at home.  She said they have been waiting on home repair.  She says she has not been on COPD medication other than oxygen.  She says she sees Dr. Vaughan Browner outpatient.  Objective: Vitals:   01/15/20 0334 01/15/20 0832 01/15/20 1020 01/15/20 1231  BP: 140/68 (!) 127/52  (!) 111/42  Pulse: 94 81  86  Resp: 20  19 16   Temp: 98 F (36.7 C)   98.5 F (36.9 C)  TempSrc: Oral   Oral  SpO2: 96% 98% 98% 100%  Weight:      Height:        Intake/Output Summary (Last 24 hours) at 01/15/2020 1430 Last data filed at 01/15/2020 1129 Gross per 24 hour  Intake 360 ml  Output 1 ml  Net 359 ml   Filed Weights   01/14/20 1951  Weight: 45.5 kg    Examination:  GENERAL: No apparent distress.  Nontoxic. HEENT: MMM.  Vision and hearing grossly intact.  NECK: Supple.   No apparent JVD.  RESP: On 2 L.  No IWOB.  Diminished aeration at bases.  Pursed lip breathing. CVS:  RRR. Heart sounds normal.  ABD/GI/GU: BS+. Abd soft, NTND.  MSK/EXT:  Moves extremities. No apparent deformity. No edema.  SKIN: no apparent skin lesion or wound NEURO: Awake, alert and oriented appropriately.  No apparent focal neuro deficit. PSYCH: Somewhat anxious.  Procedures:  None  Microbiology summarized: COVID-19 PCR negative. Influenza PCR negative.  Assessment & Plan: COPD stage C-D with acute exacerbation: PFT in 2019 with FEV1/FVC of 48% without significant change postbronchodilator.  Surprisingly not on medication other than oxygen.  Never a smoker.  She reports allergy to cats but lives with 3 of them.  Also mold exposure.  She has no eosinophilia on CBC.  She has diminished aeration at lung bases.  Breathes with pursed lip. -Continue IV Solu-Medrol -Start Anoro Ellipta-we will send prescription to her pharmacy to see if it is covered -Continue as needed albuterol -Won't start antibiotic as she has no no sputum production or signs of infection.  Chronic respiratory failure with hypoxia: On 2 L at baseline.  However, saturating in upper 90s.  -Wean off oxygen -Ambulatory saturation assessment  Normocytic anemia: Hgb 10, about baseline. -Check anemia panel -Monitor intermittently  Essential hypertension: Normotensive for most part. -Continue  home lisinopril  Fairly controlled DM-2 with hyperglycemia: A1c 7.1%.  Hyperglycemia likely due to steroid. Recent Labs  Lab 01/15/20 0559 01/15/20 1112  GLUCAP 339* 279*  -Continue SSI moderate.  Add nightly coverage -Add NovoLog AC 4 units -Further adjustment as appropriate. -Continue statin  Hypomagnesemia: Mg 1.5. -Replenish and recheck  GERD -Start Protonix while on steroid   Body mass index is 20.28 kg/m. Nutrition Problem: Increased nutrient needs Etiology: chronic illness (COPD) Signs/Symptoms:  estimated needs Interventions: Glucerna shake, MVI   DVT prophylaxis:  enoxaparin (LOVENOX) injection 40 mg Start: 01/15/20 0800  Code Status: Full code Family Communication: Attempted to call patient husband for update but no answer. Status is: Observation  The patient will require care spanning > 2 midnights and should be moved to inpatient because: Persistent severe electrolyte disturbances, IV treatments appropriate due to intensity of illness or inability to take PO and Inpatient level of care appropriate due to severity of illness  Dispo: The patient is from: Home              Anticipated d/c is to: Home              Anticipated d/c date is: 1 day              Patient currently is not medically stable to d/c.       Consultants:  None   Sch Meds:  Scheduled Meds: . albuterol  2 puff Inhalation Q6H  . atorvastatin  20 mg Oral q1800  . enoxaparin (LOVENOX) injection  40 mg Subcutaneous Q24H  . feeding supplement (GLUCERNA SHAKE)  237 mL Oral TID BM  . insulin aspart  0-15 Units Subcutaneous TID WC  . insulin aspart  0-5 Units Subcutaneous QHS  . insulin aspart  4 Units Subcutaneous TID WC  . lisinopril  10 mg Oral Daily  . methylPREDNISolone (SOLU-MEDROL) injection  40 mg Intravenous Q12H  . multivitamin with minerals  1 tablet Oral Daily  . pantoprazole  40 mg Oral Daily  . umeclidinium-vilanterol  1 puff Inhalation Daily   Continuous Infusions: . magnesium sulfate bolus IVPB     PRN Meds:.albuterol  Antimicrobials: Anti-infectives (From admission, onward)   None       I have personally reviewed the following labs and images: CBC: Recent Labs  Lab 01/14/20 2003 01/15/20 0741  WBC 6.7 9.1  NEUTROABS 5.5 8.8*  HGB 9.9* 10.3*  HCT 33.0* 33.9*  MCV 97.9 97.7  PLT 258 291   BMP &GFR Recent Labs  Lab 01/14/20 2003 01/15/20 0741  NA 140 136  K 4.8 5.0  CL 100 96*  CO2 32 28  GLUCOSE 261* 438*  BUN 28* 27*  CREATININE 0.87 1.02*  CALCIUM 8.9  9.1  MG  --  1.5*   Estimated Creatinine Clearance: 29 mL/min (A) (by C-G formula based on SCr of 1.02 mg/dL (H)). Liver & Pancreas: Recent Labs  Lab 01/14/20 2003 01/15/20 0741  AST 42* 44*  ALT 34 36  ALKPHOS 51 51  BILITOT 0.5 0.5  PROT 6.1* 7.0  ALBUMIN 3.6 3.8   No results for input(s): LIPASE, AMYLASE in the last 168 hours. No results for input(s): AMMONIA in the last 168 hours. Diabetic: Recent Labs    01/15/20 0110  HGBA1C 7.1*   Recent Labs  Lab 01/15/20 0559 01/15/20 1112  GLUCAP 339* 279*   Cardiac Enzymes: No results for input(s): CKTOTAL, CKMB, CKMBINDEX, TROPONINI in the last 168 hours. No results for input(s):  PROBNP in the last 8760 hours. Coagulation Profile: No results for input(s): INR, PROTIME in the last 168 hours. Thyroid Function Tests: No results for input(s): TSH, T4TOTAL, FREET4, T3FREE, THYROIDAB in the last 72 hours. Lipid Profile: No results for input(s): CHOL, HDL, LDLCALC, TRIG, CHOLHDL, LDLDIRECT in the last 72 hours. Anemia Panel: No results for input(s): VITAMINB12, FOLATE, FERRITIN, TIBC, IRON, RETICCTPCT in the last 72 hours. Urine analysis:    Component Value Date/Time   COLORURINE COLORLESS (A) 01/15/2020 0404   APPEARANCEUR CLEAR 01/15/2020 0404   LABSPEC 1.005 01/15/2020 0404   PHURINE 5.0 01/15/2020 0404   GLUCOSEU 50 (A) 01/15/2020 0404   HGBUR SMALL (A) 01/15/2020 0404   BILIRUBINUR NEGATIVE 01/15/2020 0404   KETONESUR NEGATIVE 01/15/2020 0404   PROTEINUR NEGATIVE 01/15/2020 0404   UROBILINOGEN 0.2 01/28/2017 1310   NITRITE NEGATIVE 01/15/2020 0404   LEUKOCYTESUR TRACE (A) 01/15/2020 0404   Sepsis Labs: Invalid input(s): PROCALCITONIN, [place]  Microbiology: Recent Results (from the past 240 hour(s))  Respiratory Panel by RT PCR (Flu A&B, Covid) - Nasopharyngeal Swab     Status: None   Collection Time: 01/14/20  8:04 PM   Specimen: Nasopharyngeal Swab  Result Value Ref Range Status   SARS Coronavirus 2 by  RT PCR NEGATIVE NEGATIVE Final    Comment: (NOTE) SARS-CoV-2 target nucleic acids are NOT DETECTED.  The SARS-CoV-2 RNA is generally detectable in upper respiratoy specimens during the acute phase of infection. The lowest concentration of SARS-CoV-2 viral copies this assay can detect is 131 copies/mL. A negative result does not preclude SARS-Cov-2 infection and should not be used as the sole basis for treatment or other patient management decisions. A negative result may occur with  improper specimen collection/handling, submission of specimen other than nasopharyngeal swab, presence of viral mutation(s) within the areas targeted by this assay, and inadequate number of viral copies (<131 copies/mL). A negative result must be combined with clinical observations, patient history, and epidemiological information. The expected result is Negative.  Fact Sheet for Patients:  PinkCheek.be  Fact Sheet for Healthcare Providers:  GravelBags.it  This test is no t yet approved or cleared by the [place] FDA and  has been authorized for detection and/or diagnosis of SARS-CoV-2 by FDA under an Emergency Use Authorization (EUA). This EUA will remain  in effect (meaning this test can be used) for the duration of the COVID-19 declaration under Section 564(b)(1) of the Act, 21 U.S.C. section 360bbb-3(b)(1), unless the authorization is terminated or revoked sooner.     Influenza A by PCR NEGATIVE NEGATIVE Final   Influenza B by PCR NEGATIVE NEGATIVE Final    Comment: (NOTE) The Xpert Xpress SARS-CoV-2/FLU/RSV assay is intended as an aid in  the diagnosis of influenza from Nasopharyngeal swab specimens and  should not be used as a sole basis for treatment. Nasal washings and  aspirates are unacceptable for Xpert Xpress SARS-CoV-2/FLU/RSV  testing.  Fact Sheet for Patients: PinkCheek.be  Fact Sheet for  Healthcare Providers: GravelBags.it  This test is not yet approved or cleared by the [place] FDA and  has been authorized for detection and/or diagnosis of SARS-CoV-2 by  FDA under an Emergency Use Authorization (EUA). This EUA will remain  in effect (meaning this test can be used) for the duration of the  Covid-19 declaration under Section 564(b)(1) of the Act, 21  U.S.C. section 360bbb-3(b)(1), unless the authorization is  terminated or revoked. Performed at [facility], [place] [address], [place], [place] [address]  Radiology Studies: DG Chest Port 1 View  Result Date: 01/14/2020 CLINICAL DATA:  Shortness of breath. EXAM: PORTABLE CHEST 1 VIEW COMPARISON:  05/17/2017 FINDINGS: The cardiac silhouette, mediastinal and hilar contours are within normal limits. There is mild tortuosity and calcification of the thoracic aorta. Mild emphysematous changes and hyperinflation. No infiltrates or effusions. The bony thorax is intact. IMPRESSION: Emphysematous changes but no acute pulmonary findings. Electronically Signed   By: Marijo Sanes M.D.   On: 01/14/2020 20:30   ECHOCARDIOGRAM COMPLETE  Result Date: 01/15/2020    ECHOCARDIOGRAM REPORT   Patient Name:   MARIKA MAHAFFY [facility] Date of Exam: 01/15/2020 Medical Rec #:  [id number]     Height:       59.0 in Accession #:    [id number]    Weight:       100.4 lb Date of Birth:  [birth year]     BSA:          1.376 m Patient Age:    31 years      BP:           127/52 mmHg Patient Gender: F             HR:           81 bpm. Exam Location:  Inpatient Procedure: 2D Echo, Cardiac Doppler and Color Doppler Indications:    CHF-Acute Diastolic  History:        Patient has no prior history of Echocardiogram examinations.                 COPD, Signs/Symptoms:Shortness of Breath; Risk                 Factors:Dyslipidemia, Hypertension and Diabetes. Chronic                 resipiratory failure. GERD.  Sonographer:    Clayton Lefort RDCS  (AE) Referring Phys: [id number] [place]  1. Left ventricular ejection fraction, by estimation, is 60 to 65%. The left ventricle has normal function. The left ventricle has no regional wall motion abnormalities. There is mild concentric left ventricular hypertrophy. Left ventricular diastolic parameters are consistent with Grade I diastolic dysfunction (impaired relaxation).  2. Right ventricular systolic function was not well visualized. The right ventricular size is not well visualized. Tricuspid regurgitation signal is inadequate for assessing PA pressure.  3. The mitral valve is normal in structure. No evidence of mitral valve regurgitation. No evidence of mitral stenosis.  4. The aortic valve is tricuspid. Aortic valve regurgitation is not visualized. Mild aortic valve sclerosis is present, with no evidence of aortic valve stenosis.  5. The inferior vena cava is normal in size with greater than 50% respiratory variability, suggesting right atrial pressure of 3 mmHg. FINDINGS  Left Ventricle: Left ventricular ejection fraction, by estimation, is 60 to 65%. The left ventricle has normal function. The left ventricle has no regional wall motion abnormalities. The left ventricular internal cavity size was normal in size. There is  mild concentric left ventricular hypertrophy. Left ventricular diastolic parameters are consistent with Grade I diastolic dysfunction (impaired relaxation). Normal left ventricular filling pressure. Right Ventricle: The right ventricular size is not well visualized. Right vetricular wall thickness was not well visualized. Right ventricular systolic function was not well visualized. Tricuspid regurgitation signal is inadequate for assessing PA pressure. Left Atrium: Left atrial size was normal in size. Right Atrium: Right atrial size was normal in size. Pericardium: There is no evidence of pericardial  effusion. Mitral Valve: The mitral valve is normal in structure. Mild  mitral annular calcification. No evidence of mitral valve regurgitation. No evidence of mitral valve stenosis. MV peak gradient, 4.8 mmHg. The mean mitral valve gradient is 2.0 mmHg. Tricuspid Valve: The tricuspid valve is normal in structure. Tricuspid valve regurgitation is trivial. No evidence of tricuspid stenosis. Aortic Valve: The aortic valve is tricuspid. Aortic valve regurgitation is not visualized. Mild aortic valve sclerosis is present, with no evidence of aortic valve stenosis. Aortic valve mean gradient measures 4.0 mmHg. Aortic valve peak gradient measures 7.0 mmHg. Aortic valve area, by VTI measures 1.84 cm. Pulmonic Valve: The pulmonic valve was normal in structure. Pulmonic valve regurgitation is not visualized. No evidence of pulmonic stenosis. Aorta: The aortic root is normal in size and structure. Venous: The inferior vena cava is normal in size with greater than 50% respiratory variability, suggesting right atrial pressure of 3 mmHg. IAS/Shunts: No atrial level shunt detected by color flow Doppler.  LEFT VENTRICLE PLAX 2D LVIDd:         3.10 cm  Diastology LVIDs:         2.10 cm  LV e' medial:    5.55 cm/s LV PW:         1.10 cm  LV E/e' medial:  12.0 LV IVS:        1.10 cm  LV e' lateral:   7.40 cm/s LVOT diam:     1.60 cm  LV E/e' lateral: 9.0 LV SV:         46 LV SV Index:   33 LVOT Area:     2.01 cm  RIGHT VENTRICLE             IVC RV Basal diam:  2.50 cm     IVC diam: 1.40 cm RV S prime:     12.00 cm/s TAPSE (M-mode): 2.0 cm LEFT ATRIUM             Index       RIGHT ATRIUM           Index LA diam:        2.40 cm 1.74 cm/m  RA Area:     10.30 cm LA Vol (A2C):   44.4 ml 32.27 ml/m RA Volume:   20.20 ml  14.68 ml/m LA Vol (A4C):   23.5 ml 17.08 ml/m LA Biplane Vol: 32.5 ml 23.62 ml/m  AORTIC VALVE AV Area (Vmax):    1.61 cm AV Area (Vmean):   1.57 cm AV Area (VTI):     1.84 cm AV Vmax:           132.00 cm/s AV Vmean:          94.700 cm/s AV VTI:            0.249 m AV Peak Grad:       7.0 mmHg AV Mean Grad:      4.0 mmHg LVOT Vmax:         106.00 cm/s LVOT Vmean:        73.800 cm/s LVOT VTI:          0.228 m LVOT/AV VTI ratio: 0.92  AORTA Ao Root diam: 3.30 cm MITRAL VALVE MV Area (PHT): 2.29 cm     SHUNTS MV Peak grad:  4.8 mmHg     Systemic VTI:  0.23 m MV Mean grad:  2.0 mmHg     Systemic Diam: 1.60 cm MV Vmax:  1.09 m/s MV Vmean:      63.1 cm/s MV Decel Time: 331 msec MV E velocity: 66.70 cm/s MV A velocity: 101.00 cm/s MV E/A ratio:  0.66 Fransico Him MD Electronically signed by Fransico Him MD Signature Date/Time: 01/15/2020/12:17:02 PM    Final      Wanona Stare T. [place]  If 7PM-7AM, please contact night-coverage [email or web address] 01/15/2020, 2:30 PM

## 2020-01-16 DIAGNOSIS — E1169 Type 2 diabetes mellitus with other specified complication: Secondary | ICD-10-CM

## 2020-01-16 DIAGNOSIS — E875 Hyperkalemia: Secondary | ICD-10-CM

## 2020-01-16 DIAGNOSIS — I10 Essential (primary) hypertension: Secondary | ICD-10-CM

## 2020-01-16 DIAGNOSIS — J441 Chronic obstructive pulmonary disease with (acute) exacerbation: Principal | ICD-10-CM

## 2020-01-16 DIAGNOSIS — K219 Gastro-esophageal reflux disease without esophagitis: Secondary | ICD-10-CM

## 2020-01-16 DIAGNOSIS — D649 Anemia, unspecified: Secondary | ICD-10-CM

## 2020-01-16 DIAGNOSIS — J9611 Chronic respiratory failure with hypoxia: Secondary | ICD-10-CM

## 2020-01-16 DIAGNOSIS — E782 Mixed hyperlipidemia: Secondary | ICD-10-CM

## 2020-01-16 DIAGNOSIS — E1165 Type 2 diabetes mellitus with hyperglycemia: Secondary | ICD-10-CM

## 2020-01-16 LAB — RENAL FUNCTION PANEL
Albumin: 3.5 g/dL (ref 3.5–5.0)
Anion gap: 7 (ref 5–15)
BUN: 35 mg/dL — ABNORMAL HIGH (ref 8–23)
CO2: 33 mmol/L — ABNORMAL HIGH (ref 22–32)
Calcium: 9.4 mg/dL (ref 8.9–10.3)
Chloride: 99 mmol/L (ref 98–111)
Creatinine, Ser: 0.89 mg/dL (ref 0.44–1.00)
GFR, Estimated: >60 mL/min (ref >60)
Glucose, Bld: 143 mg/dL — ABNORMAL HIGH (ref 70–99)
Phosphorus: 3.6 mg/dL (ref 2.5–4.6)
Potassium: 5.3 mmol/L — ABNORMAL HIGH (ref 3.5–5.1)
Sodium: 139 mmol/L (ref 135–145)

## 2020-01-16 LAB — IRON AND TIBC
Iron: 66 ug/dL (ref 28–170)
Saturation Ratios: 21 % (ref 10.4–31.8)
TIBC: 309 ug/dL (ref 250–450)
UIBC: 243 ug/dL

## 2020-01-16 LAB — RETICULOCYTES
Immature Retic Fract: 11 % (ref 2.3–15.9)
RBC.: 3.38 MIL/uL — ABNORMAL LOW (ref 3.87–5.11)
Retic Count, Absolute: 35.8 10*3/uL (ref 19.0–186.0)
Retic Ct Pct: 1.1 % (ref 0.4–3.1)

## 2020-01-16 LAB — VITAMIN B12: Vitamin B-12: 217 pg/mL (ref 180–914)

## 2020-01-16 LAB — GLUCOSE, CAPILLARY
Glucose-Capillary: 204 mg/dL — ABNORMAL HIGH (ref 70–99)
Glucose-Capillary: 339 mg/dL — ABNORMAL HIGH (ref 70–99)
Glucose-Capillary: 287 mg/dL — ABNORMAL HIGH (ref 70–99)
Glucose-Capillary: 253 mg/dL — ABNORMAL HIGH (ref 70–99)

## 2020-01-16 LAB — FOLATE: Folate: 9.1 ng/mL (ref >5.9)

## 2020-01-16 LAB — MAGNESIUM: Magnesium: 2.3 mg/dL (ref 1.7–2.4)

## 2020-01-16 LAB — FERRITIN: Ferritin: 36 ng/mL (ref 11–307)

## 2020-01-16 MED ORDER — PREDNISONE 20 MG PO TABS
40.0000 mg | ORAL_TABLET | Freq: Every day | ORAL | 0 refills | Status: DC
Start: 2020-01-16 — End: 2020-02-04

## 2020-01-16 MED ORDER — FLUTICASONE PROPIONATE 50 MCG/ACT NA SUSP: 1.0000 | Freq: Every day | NASAL | 1 refills | Status: DC

## 2020-01-16 MED ORDER — PREDNISONE 20 MG PO TABS
40.0000 mg | ORAL_TABLET | Freq: Every day | ORAL | Status: DC
  Administered 2020-01-16: 40 mg via ORAL
  Filled 2020-01-16: qty 2

## 2020-01-16 MED ORDER — INSULIN ASPART 100 UNIT/ML ~~LOC~~ SOLN
8.0000 [IU] | Freq: Three times a day (TID) | SUBCUTANEOUS | Status: DC
  Administered 2020-01-16: 8 [IU] via SUBCUTANEOUS

## 2020-01-16 MED ORDER — LISINOPRIL 10 MG PO TABS
10.0000 mg | ORAL_TABLET | Freq: Every day | ORAL | Status: DC
Start: 2020-01-21 — End: 2021-10-22

## 2020-01-16 NOTE — Progress Notes (Signed)
Pt CBG at 1100 was 287, MD stated to recheck at 1500 prior to discharge  Rechecked at 1459, 253, informed MD  MD stated okay to discharge  Pt IV removed, catheter intact, telemetry removed CCMD aware, and continuous pulse ox removed  Pt has all belongings including watch and home oxygen  Pt discharge instructions went over at bedside with pt and pt husband  Pt discharged on home oxygen via wheelchair with NT

## 2020-01-16 NOTE — Discharge Summary (Signed)
Physician Discharge Summary  Heidi Moran EPP:[id number] DOB: [birth year] DOA: 01/14/2020  PCP: Burnard Bunting, MD  Admit date: 01/14/2020 Discharge date: 01/16/2020  Admitted From: home Disposition:  home  Recommendations for Outpatient Follow-up:  1. Follow ups as below. 2. Please obtain CBC/BMP/Mag at follow up 3. Please follow up on the following pending results: none  Home Health: none Equipment/Devices: has home oxygen  Discharge Condition: Stable CODE STATUS: full code   Follow-up Information    Burnard Bunting, MD. Schedule an appointment as soon as possible for a visit in 1 week(s).   Specialty: Internal Medicine Contact information: [place] [address] [phone]        Marshell Garfinkel, MD. Schedule an appointment as soon as possible for a visit in 2 week(s).   Specialty: Pulmonary Disease Contact information: [address] [place] [address] [phone]               Hospital Course: 84 year old female with history of COPD (FEV1/FVC of 48%) not on meds, chronic RF on 2 L, RA with pulmonary nodules, HLD and GERD presenting with shortness of breath, generalized weakness and poor appetite and admitted for COPD exacerbation.   In ED, hemodynamically stable.  Saturating in upper 90s to 100% on 2 L.  BMP glucose 261.  Hgb 9.9. High-sensitivity troponin 8> 8.  BNP 41.  Influenza and COVID-19 PCR negative.  EKG NSR without acute ischemic finding.  CXR with emphysematous change.  D-dimer 0.62. UA without significant finding. Admitted with working diagnosis of COPD exacerbation.  Started on steroids and breathing treatments.  On the day of discharge, her breathing improved.  She maintained appropriate saturation on room air.  She was discharged on prednisone for 3 more days, Anoro Ellipta and albuterol.  She was advised to use a oxygen only if her oxygen saturation is below 90%.  She already has pulse oximetry at home.   She was also encouraged to take a Pepcid twice a day and maintain good hydration.  She was also started on Flonase nasal spray for possible postnasal drainage/allergy.   Patient was evaluated by therapy and no need was identified.  She maintained appropriate saturation with ambulation on room air.  Advised to follow-up with primary care doctor and her pulmonologist as above.  Discharge plan discussed with patient and patient's husband at bedside.  See individual problem list below for more hospital course.  Discharge Diagnoses:  COPD stage C-D with acute exacerbation: PFT in 2019 with FEV1/FVC of 48% without significant change postbronchodilator.  Surprisingly not on medication other than oxygen.  Never a smoker.  She reports allergy to cats but lives with 3 of them.  Also mold exposure.   However, she has no eosinophilia on CBC.  Maintain appropriate saturation with ambulation on room air. -Prednisone 40 mg daily for the next 3 days -Anoro Ellipta and as needed albuterol -Advised to use minimum oxygen to maintain appropriate saturation. -Flonase nasal spray -Outpatient follow-up with her PCP and pulmonologist in 1 to 2 weeks  Chronic respiratory failure with hypoxia: On 2 L at baseline.  Now saturating above 90% at rest and with ambulation. -Recommended avoiding oxygen unless she desaturates below 90 -Encouraged to discuss this with her pulmonologist.  Normocytic anemia: Hgb 10, about baseline.  Essential hypertension: Normotensive for most part. -Patient to hold lisinopril for 3 to 4 days given mild hyperkalemia  Fairly controlled DM-2 with hyperglycemia: A1c 7.1%.  Hyperglycemia likely due to steroid  but improved. -Discharged on home glyburide/Metformin -Encouraged to maintain good hydration while on steroid. -Continue home statin  Hypomagnesemia: Resolved.  Mild hypokalemia: Due to lisinopril? -Patient to hold lisinopril for 3 to 4 days as below  GERD -Advised to take  the Pepcid twice daily for the next 5 days, then once daily.  Possible allergic rhinitis -Started Flonase.  Body mass index is 20.28 kg/m. Nutrition Problem: Increased nutrient needs Etiology: chronic illness (COPD) Signs/Symptoms: estimated needs Interventions: Glucerna shake, MVI      Discharge Exam: Vitals:   01/16/20 0817 01/16/20 1245  BP: (!) 149/59 111/61  Pulse: 83 73  Resp: 19 16  Temp:  98.1 F (36.7 C)  SpO2: 93% 94%    GENERAL: No apparent distress.  Nontoxic. HEENT: MMM.  Vision and hearing grossly intact.  NECK: Supple.  No apparent JVD.  RESP: 96% on RA at rest.  No IWOB.  Fair aeration bilaterally. CVS:  RRR. Heart sounds normal.  ABD/GI/GU: Bowel sounds present. Soft. Non tender.  MSK/EXT:  Moves extremities. No apparent deformity. No edema.  SKIN: no apparent skin lesion or wound NEURO: Awake, alert and oriented appropriately.  No apparent focal neuro deficit. PSYCH: Calm. Normal affect.   Discharge Instructions  Discharge Instructions    Call MD for:  difficulty breathing, headache or visual disturbances   Complete by: As directed    Call MD for:  extreme fatigue   Complete by: As directed    Call MD for:  persistant dizziness or light-headedness   Complete by: As directed    Diet - low sodium heart healthy   Complete by: As directed    Diet Carb Modified   Complete by: As directed    Discharge instructions   Complete by: As directed    It has been a pleasure taking care of you!  You were hospitalized and treated for COPD exacerbation.  We have started you on inhalers and prednisone.  Please review your new medication list and the directions on your medications before you use them.  We recommend taking you Pepcid (famotidine) twice a day for the next 5 days.  Keep yourself hydrated.  You may not need oxygen if you oxygen saturation does not drop below 90% but I recommend discussing this with your pulmonologist.  Follow-up with your primary care  doctor and pulmonologist in 1 to 2 weeks or sooner if needed.   Take care,   Increase activity slowly   Complete by: As directed      Allergies as of 01/16/2020   No Known Allergies     Medication List    TAKE these medications   albuterol 108 (90 Base) MCG/ACT inhaler Commonly known as: VENTOLIN HFA Inhale 2 puffs into the lungs every 6 (six) hours as needed for wheezing or shortness of breath.   atorvastatin 20 MG tablet Commonly known as: LIPITOR Take 20 mg by mouth daily.   famotidine 20 MG tablet Commonly known as: PEPCID Take 1 tablet (20 mg total) by mouth at bedtime.   fluticasone 50 MCG/ACT nasal spray Commonly known as: Flonase Place 1 spray into both nostrils daily.   glyBURIDE-metformin 2.5-500 MG tablet Commonly known as: GLUCOVANCE Take 1 tablet by mouth daily with breakfast. Pt takes one in the morning with breakfast and one in the evening   lisinopril 10 MG tablet Commonly known as: ZESTRIL Take 1 tablet (10 mg total) by mouth daily. Start taking on: January 21, 2020 What changed: These instructions start on  January 21, 2020. If you are unsure what to do until then, ask your doctor or other care provider.   OVER THE COUNTER MEDICATION   OXYGEN Inhale 2 L/day into the lungs daily.   predniSONE 20 MG tablet Commonly known as: DELTASONE Take 2 tablets (40 mg total) by mouth daily with breakfast.   umeclidinium-vilanterol 62.5-25 MCG/INH Aepb Commonly known as: ANORO ELLIPTA Inhale 1 puff into the lungs daily.   VIACTIV PO Take 1 tablet by mouth daily.       Consultations:  None  Procedures/Studies:  DG Chest Port 1 View  Result Date: 01/14/2020 CLINICAL DATA:  Shortness of breath. EXAM: PORTABLE CHEST 1 VIEW COMPARISON:  05/17/2017 FINDINGS: The cardiac silhouette, mediastinal and hilar contours are within normal limits. There is mild tortuosity and calcification of the thoracic aorta. Mild emphysematous changes and hyperinflation. No  infiltrates or effusions. The bony thorax is intact. IMPRESSION: Emphysematous changes but no acute pulmonary findings. Electronically Signed   By: Marijo Sanes M.D.   On: 01/14/2020 20:30   ECHOCARDIOGRAM COMPLETE  Result Date: 01/15/2020    ECHOCARDIOGRAM REPORT   Patient Name:   Heidi Moran [facility] Date of Exam: 01/15/2020 Medical Rec #:  [id number]     Height:       59.0 in Accession #:    [id number]    Weight:       100.4 lb Date of Birth:  12-08-1935     BSA:          1.376 m Patient Age:    84 years      BP:           127/52 mmHg Patient Gender: F             HR:           81 bpm. Exam Location:  Inpatient Procedure: 2D Echo, Cardiac Doppler and Color Doppler Indications:    CHF-Acute Diastolic  History:        Patient has no prior history of Echocardiogram examinations.                 COPD, Signs/Symptoms:Shortness of Breath; Risk                 Factors:Dyslipidemia, Hypertension and Diabetes. Chronic                 resipiratory failure. GERD.  Sonographer:    Clayton Lefort RDCS (AE) Referring Phys: [id number] [place]  1. Left ventricular ejection fraction, by estimation, is 60 to 65%. The left ventricle has normal function. The left ventricle has no regional wall motion abnormalities. There is mild concentric left ventricular hypertrophy. Left ventricular diastolic parameters are consistent with Grade I diastolic dysfunction (impaired relaxation).  2. Right ventricular systolic function was not well visualized. The right ventricular size is not well visualized. Tricuspid regurgitation signal is inadequate for assessing PA pressure.  3. The mitral valve is normal in structure. No evidence of mitral valve regurgitation. No evidence of mitral stenosis.  4. The aortic valve is tricuspid. Aortic valve regurgitation is not visualized. Mild aortic valve sclerosis is present, with no evidence of aortic valve stenosis.  5. The inferior vena cava is normal in size with greater than 50% respiratory  variability, suggesting right atrial pressure of 3 mmHg. FINDINGS  Left Ventricle: Left ventricular ejection fraction, by estimation, is 60 to 65%. The left ventricle has normal function. The left ventricle has no regional wall motion abnormalities. The  left ventricular internal cavity size was normal in size. There is  mild concentric left ventricular hypertrophy. Left ventricular diastolic parameters are consistent with Grade I diastolic dysfunction (impaired relaxation). Normal left ventricular filling pressure. Right Ventricle: The right ventricular size is not well visualized. Right vetricular wall thickness was not well visualized. Right ventricular systolic function was not well visualized. Tricuspid regurgitation signal is inadequate for assessing PA pressure. Left Atrium: Left atrial size was normal in size. Right Atrium: Right atrial size was normal in size. Pericardium: There is no evidence of pericardial effusion. Mitral Valve: The mitral valve is normal in structure. Mild mitral annular calcification. No evidence of mitral valve regurgitation. No evidence of mitral valve stenosis. MV peak gradient, 4.8 mmHg. The mean mitral valve gradient is 2.0 mmHg. Tricuspid Valve: The tricuspid valve is normal in structure. Tricuspid valve regurgitation is trivial. No evidence of tricuspid stenosis. Aortic Valve: The aortic valve is tricuspid. Aortic valve regurgitation is not visualized. Mild aortic valve sclerosis is present, with no evidence of aortic valve stenosis. Aortic valve mean gradient measures 4.0 mmHg. Aortic valve peak gradient measures 7.0 mmHg. Aortic valve area, by VTI measures 1.84 cm. Pulmonic Valve: The pulmonic valve was normal in structure. Pulmonic valve regurgitation is not visualized. No evidence of pulmonic stenosis. Aorta: The aortic root is normal in size and structure. Venous: The inferior vena cava is normal in size with greater than 50% respiratory variability, suggesting right atrial  pressure of 3 mmHg. IAS/Shunts: No atrial level shunt detected by color flow Doppler.  LEFT VENTRICLE PLAX 2D LVIDd:         3.10 cm  Diastology LVIDs:         2.10 cm  LV e' medial:    5.55 cm/s LV PW:         1.10 cm  LV E/e' medial:  12.0 LV IVS:        1.10 cm  LV e' lateral:   7.40 cm/s LVOT diam:     1.60 cm  LV E/e' lateral: 9.0 LV SV:         46 LV SV Index:   33 LVOT Area:     2.01 cm  RIGHT VENTRICLE             IVC RV Basal diam:  2.50 cm     IVC diam: 1.40 cm RV S prime:     12.00 cm/s TAPSE (M-mode): 2.0 cm LEFT ATRIUM             Index       RIGHT ATRIUM           Index LA diam:        2.40 cm 1.74 cm/m  RA Area:     10.30 cm LA Vol (A2C):   44.4 ml 32.27 ml/m RA Volume:   20.20 ml  14.68 ml/m LA Vol (A4C):   23.5 ml 17.08 ml/m LA Biplane Vol: 32.5 ml 23.62 ml/m  AORTIC VALVE AV Area (Vmax):    1.61 cm AV Area (Vmean):   1.57 cm AV Area (VTI):     1.84 cm AV Vmax:           132.00 cm/s AV Vmean:          94.700 cm/s AV VTI:            0.249 m AV Peak Grad:      7.0 mmHg AV Mean Grad:      4.0 mmHg LVOT  Vmax:         106.00 cm/s LVOT Vmean:        73.800 cm/s LVOT VTI:          0.228 m LVOT/AV VTI ratio: 0.92  AORTA Ao Root diam: 3.30 cm MITRAL VALVE MV Area (PHT): 2.29 cm     SHUNTS MV Peak grad:  4.8 mmHg     Systemic VTI:  0.23 m MV Mean grad:  2.0 mmHg     Systemic Diam: 1.60 cm MV Vmax:       1.09 m/s MV Vmean:      63.1 cm/s MV Decel Time: 331 msec MV E velocity: 66.70 cm/s MV A velocity: 101.00 cm/s MV E/A ratio:  0.66 Fransico Him MD Electronically signed by Fransico Him MD Signature Date/Time: 01/15/2020/12:17:02 PM    Final        The results of significant diagnostics from this hospitalization (including imaging, microbiology, ancillary and laboratory) are listed below for reference.     Microbiology: Recent Results (from the past 240 hour(s))  Respiratory Panel by RT PCR (Flu A&B, Covid) - Nasopharyngeal Swab     Status: None   Collection Time: 01/14/20  8:04 PM    Specimen: Nasopharyngeal Swab  Result Value Ref Range Status   SARS Coronavirus 2 by RT PCR NEGATIVE NEGATIVE Final    Comment: (NOTE) SARS-CoV-2 target nucleic acids are NOT DETECTED.  The SARS-CoV-2 RNA is generally detectable in upper respiratoy specimens during the acute phase of infection. The lowest concentration of SARS-CoV-2 viral copies this assay can detect is 131 copies/mL. A negative result does not preclude SARS-Cov-2 infection and should not be used as the sole basis for treatment or other patient management decisions. A negative result may occur with  improper specimen collection/handling, submission of specimen other than nasopharyngeal swab, presence of viral mutation(s) within the areas targeted by this assay, and inadequate number of viral copies (<131 copies/mL). A negative result must be combined with clinical observations, patient history, and epidemiological information. The expected result is Negative.  Fact Sheet for Patients:  PinkCheek.be  Fact Sheet for Healthcare Providers:  GravelBags.it  This test is no t yet approved or cleared by the [place] FDA and  has been authorized for detection and/or diagnosis of SARS-CoV-2 by FDA under an Emergency Use Authorization (EUA). This EUA will remain  in effect (meaning this test can be used) for the duration of the COVID-19 declaration under Section 564(b)(1) of the Act, 21 U.S.C. section 360bbb-3(b)(1), unless the authorization is terminated or revoked sooner.     Influenza A by PCR NEGATIVE NEGATIVE Final   Influenza B by PCR NEGATIVE NEGATIVE Final    Comment: (NOTE) The Xpert Xpress SARS-CoV-2/FLU/RSV assay is intended as an aid in  the diagnosis of influenza from Nasopharyngeal swab specimens and  should not be used as a sole basis for treatment. Nasal washings and  aspirates are unacceptable for Xpert Xpress SARS-CoV-2/FLU/RSV   testing.  Fact Sheet for Patients: PinkCheek.be  Fact Sheet for Healthcare Providers: GravelBags.it  This test is not yet approved or cleared by the [place] FDA and  has been authorized for detection and/or diagnosis of SARS-CoV-2 by  FDA under an Emergency Use Authorization (EUA). This EUA will remain  in effect (meaning this test can be used) for the duration of the  Covid-19 declaration under Section 564(b)(1) of the Act, 21  U.S.C. section 360bbb-3(b)(1), unless the authorization is  terminated or revoked. Performed at [facility]  [facility], [place] [address], [place], [place] [address]      Labs: BNP (last 3 results) Recent Labs    01/14/20 2003  BNP 29.5   Basic Metabolic Panel: Recent Labs  Lab 01/14/20 2003 01/15/20 0741 01/16/20 0202  NA 140 136 139  K 4.8 5.0 5.3*  CL 100 96* 99  CO2 32 28 33*  GLUCOSE 261* 438* 143*  BUN 28* 27* 35*  CREATININE 0.87 1.02* 0.89  CALCIUM 8.9 9.1 9.4  MG  --  1.5* 2.3  PHOS  --   --  3.6   Liver Function Tests: Recent Labs  Lab 01/14/20 2003 01/15/20 0741 01/16/20 0202  AST 42* 44*  --   ALT 34 36  --   ALKPHOS 51 51  --   BILITOT 0.5 0.5  --   PROT 6.1* 7.0  --   ALBUMIN 3.6 3.8 3.5   No results for input(s): LIPASE, AMYLASE in the last 168 hours. No results for input(s): AMMONIA in the last 168 hours. CBC: Recent Labs  Lab 01/14/20 2003 01/15/20 0741  WBC 6.7 9.1  NEUTROABS 5.5 8.8*  HGB 9.9* 10.3*  HCT 33.0* 33.9*  MCV 97.9 97.7  PLT 258 291   Cardiac Enzymes: No results for input(s): CKTOTAL, CKMB, CKMBINDEX, TROPONINI in the last 168 hours. BNP: Invalid input(s): POCBNP CBG: Recent Labs  Lab 01/15/20 2058 01/16/20 0557 01/16/20 0914 01/16/20 1100 01/16/20 1459  GLUCAP 282* 204* 339* 287* 253*   D-Dimer Recent Labs    01/15/20 0109  DDIMER 0.62*   Hgb A1c Recent Labs    01/15/20 0110  HGBA1C 7.1*   Lipid Profile No  results for input(s): CHOL, HDL, LDLCALC, TRIG, CHOLHDL, LDLDIRECT in the last 72 hours. Thyroid function studies No results for input(s): TSH, T4TOTAL, T3FREE, THYROIDAB in the last 72 hours.  Invalid input(s): FREET3 Anemia work up Recent Labs    01/16/20 0202  VITAMINB12 217  FOLATE 9.1  FERRITIN 36  TIBC 309  IRON 66  RETICCTPCT 1.1   Urinalysis    Component Value Date/Time   COLORURINE COLORLESS (A) 01/15/2020 0404   APPEARANCEUR CLEAR 01/15/2020 0404   LABSPEC 1.005 01/15/2020 0404   PHURINE 5.0 01/15/2020 0404   GLUCOSEU 50 (A) 01/15/2020 0404   HGBUR SMALL (A) 01/15/2020 0404   BILIRUBINUR NEGATIVE 01/15/2020 0404   KETONESUR NEGATIVE 01/15/2020 0404   PROTEINUR NEGATIVE 01/15/2020 0404   UROBILINOGEN 0.2 01/28/2017 1310   NITRITE NEGATIVE 01/15/2020 0404   LEUKOCYTESUR TRACE (A) 01/15/2020 0404   Sepsis Labs Invalid input(s): PROCALCITONIN,  WBC,  LACTICIDVEN   Time coordinating discharge: 35 minutes  SIGNED:  Mercy Riding, MD  [facility] 01/16/2020, 3:36 PM  If 7PM-7AM, please contact night-coverage [email or web address]

## 2020-01-20 ENCOUNTER — Telehealth: Payer: Self-pay | Admitting: Pulmonary Disease

## 2020-01-20 NOTE — Telephone Encounter (Signed)
Called and spoke with patient's husband Shanon Brow who is requesting that patient have hospital follow up with Dr. Vaughan Browner, they are not willing to see APP.   Dr. Vaughan Browner your next available 30 minute slot is not until 12/3, do you prefer that this appointment be in a 30 minute slot?

## 2020-01-21 NOTE — Telephone Encounter (Signed)
Called and spoke with patient's husband, Shanon Brow, listed on DPR, advised that the first available f/u with Dr. Vaughan Browner is December 2nd.  He stated that was ridiculous and he has advised her to get another doctor.  He states he was told by the doctor at the hospital to have her to see the doctor for a f/u.  I advised him that we can get her in sooner with a NP and that they see a lot of our patients after they are discharged from the hospital.  He stated that he would speak with his wife and one of them would call back.  Will await their return call.

## 2020-01-21 NOTE — Telephone Encounter (Signed)
Ok to book with me for next available 15 mins slot.

## 2020-01-26 NOTE — Telephone Encounter (Signed)
Spoke to Patient's spouse, David(DPR), who stated that patient is agreeable with seeing NP.  Appt scheduled for 01/31/2020 at 11:30. Nothing further needed.

## 2020-01-31 ENCOUNTER — Ambulatory Visit: Payer: Medicare Other | Admitting: Adult Health

## 2020-02-02 DIAGNOSIS — E119 Type 2 diabetes mellitus without complications: Secondary | ICD-10-CM | POA: Diagnosis not present

## 2020-02-02 DIAGNOSIS — H02005 Unspecified entropion of left lower eyelid: Secondary | ICD-10-CM | POA: Diagnosis not present

## 2020-02-02 DIAGNOSIS — Z961 Presence of intraocular lens: Secondary | ICD-10-CM | POA: Diagnosis not present

## 2020-02-02 DIAGNOSIS — H02054 Trichiasis without entropian left upper eyelid: Secondary | ICD-10-CM | POA: Diagnosis not present

## 2020-02-04 ENCOUNTER — Encounter: Payer: Self-pay | Admitting: Pulmonary Disease

## 2020-02-04 ENCOUNTER — Other Ambulatory Visit: Payer: Self-pay

## 2020-02-04 ENCOUNTER — Ambulatory Visit (INDEPENDENT_AMBULATORY_CARE_PROVIDER_SITE_OTHER): Payer: Medicare Other | Admitting: Pulmonary Disease

## 2020-02-04 VITALS — BP 110/62 | HR 83 | Temp 97.5°F | Ht 59.0 in | Wt 109.8 lb

## 2020-02-04 DIAGNOSIS — J449 Chronic obstructive pulmonary disease, unspecified: Secondary | ICD-10-CM | POA: Diagnosis not present

## 2020-02-04 DIAGNOSIS — R918 Other nonspecific abnormal finding of lung field: Secondary | ICD-10-CM | POA: Diagnosis not present

## 2020-02-04 DIAGNOSIS — J9611 Chronic respiratory failure with hypoxia: Secondary | ICD-10-CM | POA: Diagnosis not present

## 2020-02-04 NOTE — Assessment & Plan Note (Signed)
History of pulmonary nodules Felt to be RA nodules Following with CT imaging  Plan: Complete CT is planned in April/2022

## 2020-02-04 NOTE — Assessment & Plan Note (Signed)
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 Vaughan Browner

## 2020-02-04 NOTE — Progress Notes (Signed)
@Patient  ID: Heidi Moran, female    DOB: May 05, 1935, 84 y.o.   MRN: [id number]  Chief Complaint  Patient presents with  . Follow-up    ptchronic hypoxia . pt was given neb tx at hosp    Referring provider: Burnard Bunting, MD  HPI:  84 year old female never smoker followed in our office for obstructive lung disease and chronic respiratory failure  PMH: GERD, hypertension, type 2 diabetes Smoker/ Smoking History: Never smoker Maintenance:  Anoro Pt of: Dr. Vaughan Browner  02/04/2020  - Visit   84 year old female never smoker followed in our office for obstructive lung disease and chronic respiratory failure.  She is established with Dr. Vaughan Browner.  She was last seen in April/2021 by Dr. Vaughan Browner.  Plan of care from that office visit was for her to obtain a CT scan without contrast in 12 months, continue oxygen therapy she is encouraged to remain on Pepcid.  Patient was recently hospitalized in October/2021 for 2 days for suspected COPD exacerbation.  She was discharged on 01/16/2020.  An excerpt of that discharge summary is listed below:  Admit date: 01/14/2020 Discharge date: 01/16/2020  Admitted From: home Disposition:  home  Recommendations for Outpatient Follow-up:  1. Follow ups as below. 2. Please obtain CBC/BMP/Mag at follow up 3. Please follow up on the following pending results: none  Home Health: none Equipment/Devices: has home oxygen  Discharge Condition: Stable CODE STATUS: full code  Hospital Course: 84 year old female with history of COPD(FEV1/FVC of 48%) not on meds, chronic RF on 2 L, RA with pulmonary nodules, HLD and GERD presenting with shortness of breath, generalized weakness and poor appetite and admitted for COPD exacerbation.  In ED, hemodynamically stable. Saturating in upper 90s to 100% on 2 L. BMP glucose 261. Hgb 9.9. High-sensitivity troponin 8>8. BNP 41. Influenza and COVID-19 PCR negative. EKG NSR without acute ischemic finding. CXR  with emphysematous change. D-dimer 0.62. UA without significant finding. Admitted with working diagnosis of COPD exacerbation.Started on steroids and breathing treatments.  On the day of discharge, her breathing improved.  She maintained appropriate saturation on room air.  She was discharged on prednisone for 3 more days, Anoro Ellipta and albuterol.  She was advised to use a oxygen only if her oxygen saturation is below 90%.  She already has pulse oximetry at home.  She was also encouraged to take a Pepcid twice a day and maintain good hydration.  She was also started on Flonase nasal spray for possible postnasal drainage/allergy.   Patient was evaluated by therapy and no need was identified.  She maintained appropriate saturation with ambulation on room air.  Advised to follow-up with primary care doctor and her pulmonologist as above.  Discharge plan discussed with patient and patient's husband at bedside.  See individual problem list below for more hospital course.  Discharge Diagnoses:  COPDstageC-Dwith acute exacerbation:PFT in 2019 with FEV1/FVC of 48% without significant change postbronchodilator. Surprisingly not on medication other than oxygen. Never asmoker. She reports allergy to cats but lives with3 of them. Also mold exposure.  However,she has no eosinophilia on CBC.  Maintain appropriate saturation with ambulation on room air. -Prednisone 40 mg daily for the next 3 days -Anoro Ellipta and as needed albuterol -Advised to use minimum oxygen to maintain appropriate saturation. -Flonase nasal spray -Outpatient follow-up with her PCP and pulmonologist in 1 to 2 weeks  Chronic respiratory failure with hypoxia: On 2 L at baseline.  Now saturating above 90% at rest and with ambulation. -  Recommended avoiding oxygen unless she desaturates below 90 -Encouraged to discuss this with her pulmonologist.  Normocytic anemia: Hgb 10, about baseline.  Essential  hypertension: Normotensive for most part. -Patient to hold lisinopril for 3 to 4 days given mild hyperkalemia  Fairly controlled DM-2 with hyperglycemia: A1c 7.1%. Hyperglycemia likely due to steroid but improved. -Discharged on home glyburide/Metformin -Encouraged to maintain good hydration while on steroid. -Continue home statin  Hypomagnesemia: Resolved.  Mild hypokalemia: Due to lisinopril? -Patient to hold lisinopril for 3 to 4 days as below  GERD -Advised to take the Pepcid twice daily for the next 5 days, then once daily.  Possible allergic rhinitis -Started Flonase.  Patient presenting to office today reporting that she is doing better since being discharged from the hospital.  They have placed her on Anoro Ellipta.  Patient was given the impression by the hospitalist that she may be able to come off of oxygen.  We will discuss this today.  Patient and family are both concerned about why the patient was not already on inhalers.  We will discuss this today.  Patient previously wanted to go back to pulmonary rehab.  But unfortunately with pulmonary rehab patient spouse is not able to accompany her.  This is caused her to not want to go back to pulmonary rehab.  She was also under the impression that she would have to have a negative Covid test prior to every pulmonary rehab appointment.  We are unsure the validity of this.  Patient is not interested in being referred back.  Patient continues to report that she leads a sedentary lifestyle.  She is not very active.  She reports that she has not been active since the onset of the COVID-19 pandemic.  Patient walked in office today and required 3 L pulsed via POC with physical exertion.   Questionaires / Pulmonary Flowsheets:   ACT:  No flowsheet data found.  MMRC: mMRC Dyspnea Scale mMRC Score  02/04/2020 3    Epworth:  No flowsheet data found.  Tests:   02/12/2017-CT chest without contrast-multiple pulmonary nodules  throughout both lungs, highly concerning for metastatic disease, PET/CT recommended for further evaluation  02/27/2017-PET scan-again noted multiple pulmonary nodules in both lungs, most of these are less than 1 cm too small to reliably characterize by PET CT, the dominant nodule within the right middle lobe measures mild to moderate FDG uptake, cannot rule out malignancy  06/11/2017-CT super D chest without contrast-interval stability of numerous solid pulmonary nodules scattered throughout both lungs, largest being 1.3 cm in the right middle lobe  12/13/2017-CT chest without contrast-multiple bilateral pulmonary nodules measures 13 mm or less in size and are stable from 02/12/2017.  12/16/2018-CT chest without contrast-lobular morphology and some areas of cavitation with new groundglass and tiny centrilobular nodules, question of infectious or inflammatory process  06/21/2019-CT chest without contrast-no acute cardiopulmonary abnormalities, stable appearance of bilateral pulmonary nodules, malignancy is felt less likely given the relatively stability of these nodules, largest nodules in the right middle lobe measuring 1.2 cm  12/15/2017-pulmonary function test-FVC 0.99 (49% addicted), postbronchodilator ratio 44, postbronchodilator FEV1 0.42 (28% addicted), no bronchodilator response, DLCO 6.81 (38% predicted) Severe obstructive airways disease, hyperinflation, severe diffusion defect  CBC 06/24/2017-WBC 6.2, eos 1.5%, absolute eosinophil count 100 Blood allergy profile 06/24/2017-IgE to 32, sensitive to cat, Aspergillus, mouse urine  Alpha-1 antitrypsin 12/15/2017-155, PI MM   FENO:  Lab Results  Component Value Date   NITRICOXIDE 36 06/24/2017    PFT: PFT  Results Latest Ref Rng & Units 12/15/2017  FVC-Pre L 0.99  FVC-Predicted Pre % 49  FVC-Post L 0.95  FVC-Predicted Post % 47  Pre FEV1/FVC % % 48  Post FEV1/FCV % % 44  FEV1-Pre L 0.48  FEV1-Predicted Pre % 32  FEV1-Post L 0.42  DLCO  uncorrected ml/min/mmHg 6.81  DLCO UNC% % 38  DLVA Predicted % 97  TLC L 6.16  TLC % Predicted % 143  RV % Predicted % 242    WALK:  SIX MIN WALK 02/04/2020 11/18/2017 08/15/2017 06/24/2017  Supplimental Oxygen during Test? (L/min) - - - No  2 Minute Oxygen Saturation % - 99 99 -  2 Minute HR - 99 102 -  4 Minute Oxygen Saturation % - 92 93 -  4 Minute HR - 107 107 -  6 Minute Oxygen Saturation % - 93 92 -  6 Minute HR - 108 107 -  Tech Comments: pt walked three laps pt o2 dropped to 88% on two lap o2 was turned to 3 liters pulse oxygen was able to maintain stats in third lap - - pt unable to do lap 3 due to fatigue and sob    Imaging: DG Chest Port 1 View  Result Date: 01/14/2020 CLINICAL DATA:  Shortness of breath. EXAM: PORTABLE CHEST 1 VIEW COMPARISON:  05/17/2017 FINDINGS: The cardiac silhouette, mediastinal and hilar contours are within normal limits. There is mild tortuosity and calcification of the thoracic aorta. Mild emphysematous changes and hyperinflation. No infiltrates or effusions. The bony thorax is intact. IMPRESSION: Emphysematous changes but no acute pulmonary findings. Electronically Signed   By: Marijo Sanes M.D.   On: 01/14/2020 20:30   ECHOCARDIOGRAM COMPLETE  Result Date: 01/15/2020    ECHOCARDIOGRAM REPORT   Patient Name:   KHLOI RAWL [facility] Date of Exam: 01/15/2020 Medical Rec #:  [id number]     Height:       59.0 in Accession #:    [id number]    Weight:       100.4 lb Date of Birth:  [birth year]     BSA:          1.376 m Patient Age:    37 years      BP:           127/52 mmHg Patient Gender: F             HR:           81 bpm. Exam Location:  Inpatient Procedure: 2D Echo, Cardiac Doppler and Color Doppler Indications:    CHF-Acute Diastolic  History:        Patient has no prior history of Echocardiogram examinations.                 COPD, Signs/Symptoms:Shortness of Breath; Risk                 Factors:Dyslipidemia, Hypertension and Diabetes. Chronic                  resipiratory failure. GERD.  Sonographer:    Clayton Lefort RDCS (AE) Referring Phys: [id number] [place]  1. Left ventricular ejection fraction, by estimation, is 60 to 65%. The left ventricle has normal function. The left ventricle has no regional wall motion abnormalities. There is mild concentric left ventricular hypertrophy. Left ventricular diastolic parameters are consistent with Grade I diastolic dysfunction (impaired relaxation).  2. Right ventricular systolic function was not well visualized. The right ventricular size is not well  visualized. Tricuspid regurgitation signal is inadequate for assessing PA pressure.  3. The mitral valve is normal in structure. No evidence of mitral valve regurgitation. No evidence of mitral stenosis.  4. The aortic valve is tricuspid. Aortic valve regurgitation is not visualized. Mild aortic valve sclerosis is present, with no evidence of aortic valve stenosis.  5. The inferior vena cava is normal in size with greater than 50% respiratory variability, suggesting right atrial pressure of 3 mmHg. FINDINGS  Left Ventricle: Left ventricular ejection fraction, by estimation, is 60 to 65%. The left ventricle has normal function. The left ventricle has no regional wall motion abnormalities. The left ventricular internal cavity size was normal in size. There is  mild concentric left ventricular hypertrophy. Left ventricular diastolic parameters are consistent with Grade I diastolic dysfunction (impaired relaxation). Normal left ventricular filling pressure. Right Ventricle: The right ventricular size is not well visualized. Right vetricular wall thickness was not well visualized. Right ventricular systolic function was not well visualized. Tricuspid regurgitation signal is inadequate for assessing PA pressure. Left Atrium: Left atrial size was normal in size. Right Atrium: Right atrial size was normal in size. Pericardium: There is no evidence of pericardial effusion.  Mitral Valve: The mitral valve is normal in structure. Mild mitral annular calcification. No evidence of mitral valve regurgitation. No evidence of mitral valve stenosis. MV peak gradient, 4.8 mmHg. The mean mitral valve gradient is 2.0 mmHg. Tricuspid Valve: The tricuspid valve is normal in structure. Tricuspid valve regurgitation is trivial. No evidence of tricuspid stenosis. Aortic Valve: The aortic valve is tricuspid. Aortic valve regurgitation is not visualized. Mild aortic valve sclerosis is present, with no evidence of aortic valve stenosis. Aortic valve mean gradient measures 4.0 mmHg. Aortic valve peak gradient measures 7.0 mmHg. Aortic valve area, by VTI measures 1.84 cm. Pulmonic Valve: The pulmonic valve was normal in structure. Pulmonic valve regurgitation is not visualized. No evidence of pulmonic stenosis. Aorta: The aortic root is normal in size and structure. Venous: The inferior vena cava is normal in size with greater than 50% respiratory variability, suggesting right atrial pressure of 3 mmHg. IAS/Shunts: No atrial level shunt detected by color flow Doppler.  LEFT VENTRICLE PLAX 2D LVIDd:         3.10 cm  Diastology LVIDs:         2.10 cm  LV e' medial:    5.55 cm/s LV PW:         1.10 cm  LV E/e' medial:  12.0 LV IVS:        1.10 cm  LV e' lateral:   7.40 cm/s LVOT diam:     1.60 cm  LV E/e' lateral: 9.0 LV SV:         46 LV SV Index:   33 LVOT Area:     2.01 cm  RIGHT VENTRICLE             IVC RV Basal diam:  2.50 cm     IVC diam: 1.40 cm RV S prime:     12.00 cm/s TAPSE (M-mode): 2.0 cm LEFT ATRIUM             Index       RIGHT ATRIUM           Index LA diam:        2.40 cm 1.74 cm/m  RA Area:     10.30 cm LA Vol (A2C):   44.4 ml 32.27 ml/m RA Volume:   20.20 ml  14.68 ml/m LA Vol (A4C):   23.5 ml 17.08 ml/m LA Biplane Vol: 32.5 ml 23.62 ml/m  AORTIC VALVE AV Area (Vmax):    1.61 cm AV Area (Vmean):   1.57 cm AV Area (VTI):     1.84 cm AV Vmax:           132.00 cm/s AV Vmean:           94.700 cm/s AV VTI:            0.249 m AV Peak Grad:      7.0 mmHg AV Mean Grad:      4.0 mmHg LVOT Vmax:         106.00 cm/s LVOT Vmean:        73.800 cm/s LVOT VTI:          0.228 m LVOT/AV VTI ratio: 0.92  AORTA Ao Root diam: 3.30 cm MITRAL VALVE MV Area (PHT): 2.29 cm     SHUNTS MV Peak grad:  4.8 mmHg     Systemic VTI:  0.23 m MV Mean grad:  2.0 mmHg     Systemic Diam: 1.60 cm MV Vmax:       1.09 m/s MV Vmean:      63.1 cm/s MV Decel Time: 331 msec MV E velocity: 66.70 cm/s MV A velocity: 101.00 cm/s MV E/A ratio:  0.66 Fransico Him MD Electronically signed by Fransico Him MD Signature Date/Time: 01/15/2020/12:17:02 PM    Final     Lab Results:  CBC    Component Value Date/Time   WBC 9.1 01/15/2020 0741   RBC 3.38 (L) 01/16/2020 0202   RBC 3.47 (L) 01/15/2020 0741   HGB 10.3 (L) 01/15/2020 0741   HCT 33.9 (L) 01/15/2020 0741   PLT 291 01/15/2020 0741   MCV 97.7 01/15/2020 0741   MCH 29.7 01/15/2020 0741   MCHC 30.4 01/15/2020 0741   RDW 12.2 01/15/2020 0741   LYMPHSABS 0.2 (L) 01/15/2020 0741   MONOABS 0.1 01/15/2020 0741   EOSABS 0.0 01/15/2020 0741   BASOSABS 0.0 01/15/2020 0741    BMET    Component Value Date/Time   NA 139 01/16/2020 0202   K 5.3 (H) 01/16/2020 0202   CL 99 01/16/2020 0202   CO2 33 (H) 01/16/2020 0202   GLUCOSE 143 (H) 01/16/2020 0202   BUN 35 (H) 01/16/2020 0202   CREATININE 0.89 01/16/2020 0202   CALCIUM 9.4 01/16/2020 0202   GFRNONAA >60 01/16/2020 0202   GFRAA 57 (L) 05/21/2017 0246    BNP    Component Value Date/Time   BNP 41.1 01/14/2020 2003    ProBNP No results found for: PROBNP  Specialty Problems      Pulmonary Problems   Chronic respiratory failure with hypoxia (HCC)   COPD exacerbation (HCC)   Obstructive airway disease (HCC)      No Known Allergies  Immunization History  Administered Date(s) Administered  . Influenza, High Dose Seasonal PF 12/08/2017  . Influenza,inj,Quad PF,6+ Mos 12/14/2016, 12/01/2018  .  Influenza-Unspecified 12/22/2019  . PFIZER SARS-COV-2 Vaccination 05/02/2019, 05/25/2019    Past Medical History:  Diagnosis Date  . Anemia   . Diabetes mellitus without complication ([place])   . Eczema   . Hyperlipidemia   . Hypertension   . Osteoarthritis   . Renal disorder     Tobacco History: Social History   Tobacco Use  Smoking Status Never Smoker  Smokeless Tobacco Never Used   Counseling given: Not Answered   Continue to not  smoke  Outpatient Encounter Medications as of 02/04/2020  Medication Sig  . albuterol (VENTOLIN HFA) 108 (90 Base) MCG/ACT inhaler Inhale 2 puffs into the lungs every 6 (six) hours as needed for wheezing or shortness of breath.  Marland Kitchen atorvastatin (LIPITOR) 20 MG tablet Take 20 mg by mouth daily.  . Calcium-Vitamin D-Vitamin K (VIACTIV PO) Take 1 tablet by mouth daily.  . famotidine (PEPCID) 20 MG tablet Take 1 tablet (20 mg total) by mouth at bedtime.  . fluticasone (FLONASE) 50 MCG/ACT nasal spray Place 1 spray into both nostrils daily.  Marland Kitchen glyBURIDE-metformin (GLUCOVANCE) 2.5-500 MG tablet Take 1 tablet by mouth daily with breakfast. Pt takes one in the morning with breakfast and one in the evening  . lisinopril (ZESTRIL) 10 MG tablet Take 1 tablet (10 mg total) by mouth daily.  Marland Kitchen OVER THE COUNTER MEDICATION   . OXYGEN Inhale 2 L/day into the lungs daily.  Marland Kitchen umeclidinium-vilanterol (ANORO ELLIPTA) 62.5-25 MCG/INH AEPB Inhale 1 puff into the lungs daily.  . [DISCONTINUED] predniSONE (DELTASONE) 20 MG tablet Take 2 tablets (40 mg total) by mouth daily with breakfast.   No facility-administered encounter medications on file as of 02/04/2020.     Review of Systems  Review of Systems  Constitutional: Positive for fatigue. Negative for activity change and fever.  HENT: Negative for sinus pressure, sinus pain and sore throat.   Respiratory: Positive for shortness of breath. Negative for cough and wheezing.   Cardiovascular: Negative for chest pain  and palpitations.  Gastrointestinal: Negative for diarrhea, nausea and vomiting.  Musculoskeletal: Negative for arthralgias.  Neurological: Negative for dizziness.  Psychiatric/Behavioral: Negative for sleep disturbance. The patient is not nervous/anxious.      Physical Exam  BP 110/62 (BP Location: Left Arm, Cuff Size: Normal)   Pulse 83   Temp (!) 97.5 F (36.4 C) (Oral)   Ht 4\' 11"  (1.499 m)   Wt 109 lb 12.8 oz (49.8 kg)   SpO2 91%   BMI 22.18 kg/m   Wt Readings from Last 5 Encounters:  02/04/20 109 lb 12.8 oz (49.8 kg)  01/14/20 100 lb 6.4 oz (45.5 kg)  06/24/19 109 lb 6.4 oz (49.6 kg)  12/17/18 111 lb (50.3 kg)  05/06/18 107 lb 3.2 oz (48.6 kg)    BMI Readings from Last 5 Encounters:  02/04/20 22.18 kg/m  01/14/20 20.28 kg/m  06/24/19 22.10 kg/m  12/17/18 22.42 kg/m  05/06/18 21.65 kg/m     Physical Exam Vitals and nursing note reviewed.  Constitutional:      General: She is not in acute distress.    Appearance: Normal appearance. She is normal weight.  HENT:     Head: Normocephalic and atraumatic.     Right Ear: External ear normal.     Left Ear: External ear normal.     Nose: Nose normal. No congestion.     Mouth/Throat:     Mouth: Mucous membranes are moist.     Pharynx: Oropharynx is clear.  Eyes:     Pupils: Pupils are equal, round, and reactive to light.  Cardiovascular:     Rate and Rhythm: Normal rate and regular rhythm.     Pulses: Normal pulses.     Heart sounds: Normal heart sounds. No murmur heard.   Pulmonary:     Breath sounds: Normal breath sounds. No decreased air movement. No decreased breath sounds, wheezing or rales.  Musculoskeletal:     Cervical back: Normal range of motion.  Skin:  General: Skin is warm and dry.     Capillary Refill: Capillary refill takes less than 2 seconds.  Neurological:     General: No focal deficit present.     Mental Status: She is alert and oriented to person, place, and time. Mental status is  at baseline.     Gait: Gait normal.  Psychiatric:        Mood and Affect: Mood normal.        Behavior: Behavior normal.        Thought Content: Thought content normal.        Judgment: Judgment normal.       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 Vaughan Browner  Abnormal findings on diagnostic imaging of lung History of pulmonary nodules Felt to be RA nodules Following with CT imaging  Plan: Complete CT is planned in April/2022    Return in about 6 weeks (around 03/17/2020), or if symptoms worsen or fail to improve, for Follow up with Dr. Vaughan Browner.   Lauraine Rinne, NP 02/04/2020   This appointment required 32 minutes of patient care (this includes precharting, chart review, review of results, face-to-face care, etc.).

## 2020-02-04 NOTE — Assessment & Plan Note (Signed)
Plan: Continue oxygen therapy Walk today in office, patient required 3 L pulsed via POC

## 2020-02-04 NOTE — Patient Instructions (Addendum)
You were seen today by Lauraine Rinne, NP  for:   1. Obstructive airway disease (HCC)  Anoro Ellipta  >>> Take 1 puff daily in the morning right when you wake up >>>Rinse your mouth out after use >>>This is a daily maintenance inhaler, NOT a rescue inhaler >>>Contact our office if you are having difficulties affording or obtaining this medication >>>It is important for you to be able to take this daily and not miss any doses   Only use your albuterol as a rescue medication to be used if you can't catch your breath by resting or doing a relaxed purse lip breathing pattern.  - The less you use it, the better it will work when you need it. - Ok to use up to 2 puffs  every 4 hours if you must but call for immediate appointment if use goes up over your usual need - Don't leave home without it !!  (think of it like the spare tire for your car)   Note your daily symptoms > remember "red flags" for COPD:   >>>Increase in cough >>>increase in sputum production >>>increase in shortness of breath or activity  intolerance.   If you notice these symptoms, please call the office to be seen.    2. Chronic respiratory failure with hypoxia (HCC)  Walk today in office >>> Required 3 L with exertion  Continue oxygen therapy as prescribed  >>>maintain oxygen saturations greater than 88 percent  >>>if unable to maintain oxygen saturations please contact the office  >>>do not smoke with oxygen  >>>can use nasal saline gel or nasal saline rinses to moisturize nose if oxygen causes dryness  3. Abnormal findings on diagnostic imaging of lung   Repeat CT scan in April/2022  Follow Up:    Return in about 6 weeks (around 03/17/2020), or if symptoms worsen or fail to improve, for Follow up with Dr. Vaughan Browner.   Notification of test results are managed in the following manner: If there are  any recommendations or changes to the  plan of care discussed in office today,  we will contact you and let you know  what they are. If you do not hear from [place], then your results are normal and you can view them through your  MyChart account , or a letter will be sent to you. Thank you again for trusting [place] with your care  - Thank you, [facility]    It is flu season:   >>> Best ways to protect herself from the flu: Receive the yearly flu vaccine, practice good hand hygiene washing with soap and also using hand sanitizer when available, eat a nutritious meals, get adequate rest, hydrate appropriately       Please contact the office if your symptoms worsen or you have concerns that you are not improving.   Thank you for choosing [facility] for your healthcare, and for allowing [place] to partner with you on your healthcare journey. I am thankful to be able to provide care to you today.   Wyn Quaker FNP-C

## 2020-02-07 DIAGNOSIS — B351 Tinea unguium: Secondary | ICD-10-CM | POA: Diagnosis not present

## 2020-02-07 DIAGNOSIS — I739 Peripheral vascular disease, unspecified: Secondary | ICD-10-CM | POA: Diagnosis not present

## 2020-02-07 DIAGNOSIS — E1351 Other specified diabetes mellitus with diabetic peripheral angiopathy without gangrene: Secondary | ICD-10-CM | POA: Diagnosis not present

## 2020-03-07 DIAGNOSIS — Z1212 Encounter for screening for malignant neoplasm of rectum: Secondary | ICD-10-CM | POA: Diagnosis not present

## 2020-03-08 ENCOUNTER — Ambulatory Visit (INDEPENDENT_AMBULATORY_CARE_PROVIDER_SITE_OTHER): Payer: Medicare Other | Admitting: Pulmonary Disease

## 2020-03-08 ENCOUNTER — Other Ambulatory Visit: Payer: Self-pay

## 2020-03-08 ENCOUNTER — Encounter: Payer: Self-pay | Admitting: Pulmonary Disease

## 2020-03-08 VITALS — BP 122/64 | HR 79 | Temp 97.5°F | Ht 59.0 in | Wt 109.0 lb

## 2020-03-08 DIAGNOSIS — R911 Solitary pulmonary nodule: Secondary | ICD-10-CM

## 2020-03-08 DIAGNOSIS — J449 Chronic obstructive pulmonary disease, unspecified: Secondary | ICD-10-CM | POA: Diagnosis not present

## 2020-03-08 DIAGNOSIS — J9611 Chronic respiratory failure with hypoxia: Secondary | ICD-10-CM | POA: Diagnosis not present

## 2020-03-08 MED ORDER — FLUTICASONE PROPIONATE 50 MCG/ACT NA SUSP: 1.0000 | Freq: Every day | NASAL | 3 refills | Status: DC

## 2020-03-08 MED ORDER — UMECLIDINIUM-VILANTEROL 62.5-25 MCG/INH IN AEPB: 1.0000 | INHALATION_SPRAY | Freq: Every day | RESPIRATORY_TRACT | 3 refills | Status: DC

## 2020-03-08 MED ORDER — ALBUTEROL SULFATE HFA 108 (90 BASE) MCG/ACT IN AERS: 2.0000 | INHALATION_SPRAY | Freq: Four times a day (QID) | RESPIRATORY_TRACT | 0 refills | Status: DC | PRN

## 2020-03-08 NOTE — Progress Notes (Signed)
Heidi Moran    [id number]    04/09/1935  Primary Care Physician:Aronson, Delfino Lovett, MD  Referring Physician: Burnard Bunting, MD [address] [place],  [place] [address]  Chief complaint:  Follow up for  Rheumatoid lung nodules Hypoxic respiratory failure on oxygen.  HPI: 84 year old with history of rheumatoid arthritis, diabetes, hyperlipidemia, hypertension.  She was evaluated in the ED recently fatigue, cough with a chest x-ray that showed lung nodules.  She had a follow-up CT scan and PET scan which demonstrated multiple pulmonary nodules, mild uptake in the dominant nodule.  [Reports below]. She has history of rheumatoid arthritis diagnosed about 2 years ago.  She follows with Dr. Trudie Reed, previously treated with Golinumab infusions.  Hospitalized in March 2019 for acute respiratory failure with bronchospasm and hypoxia, upper respiratory tract infection. Flu test was negative, CT chest showed no pulmonary embolism or acute lung abnormality Treated with steroids with improvement in symptoms and discharged on supplemental oxygen to SNF.  Received note from Dr. Trudie Reed, rheumatology dated 10/15/2017 Follow-up for rheumatoid arthritis with stable symptoms, no flareups Previously on Golimumab but currently not on therapy.  Pets: Cats.  No birds, farm animals Occupation: Housewife Exposures: None exposures Smoking history: Never smoker Travel History:Grew up in [place].  Moved to [place] 35 years ago.  Interim history: Hospitalized in November 2021 for dyspnea, acute on chronic respiratory failure, suspected COPD exacerbation Treated with prednisone and started on Anoro.  She is doing well with this inhaler with no issues.  Outpatient Encounter Medications as of 03/08/2020  Medication Sig  . albuterol (VENTOLIN HFA) 108 (90 Base) MCG/ACT inhaler Inhale 2 puffs into the lungs every 6 (six) hours as needed for wheezing or shortness of breath.  Marland Kitchen atorvastatin  (LIPITOR) 20 MG tablet Take 20 mg by mouth daily.  . Calcium-Vitamin D-Vitamin K (VIACTIV PO) Take 1 tablet by mouth daily.  . famotidine (PEPCID) 20 MG tablet Take 1 tablet (20 mg total) by mouth at bedtime.  . fluticasone (FLONASE) 50 MCG/ACT nasal spray Place 1 spray into both nostrils daily.  Marland Kitchen glyBURIDE-metformin (GLUCOVANCE) 2.5-500 MG tablet Take 1 tablet by mouth daily with breakfast. Pt takes one in the morning with breakfast and one in the evening  . lisinopril (ZESTRIL) 10 MG tablet Take 1 tablet (10 mg total) by mouth daily.  Marland Kitchen OVER THE COUNTER MEDICATION   . OXYGEN Inhale 2 L/day into the lungs daily.  Marland Kitchen umeclidinium-vilanterol (ANORO ELLIPTA) 62.5-25 MCG/INH AEPB Inhale 1 puff into the lungs daily.   No facility-administered encounter medications on file as of 03/08/2020.   Physical Exam: Blood pressure 122/64, pulse 79, temperature (!) 97.5 F (36.4 C), temperature source Temporal, height 4\' 11"  (1.499 m), weight 109 lb (49.4 kg), SpO2 97 %. Gen:      No acute distress HEENT:  EOMI, sclera anicteric Neck:     No masses; no thyromegaly Lungs:    Clear to auscultation bilaterally; normal respiratory effort CV:         Regular rate and rhythm; no murmurs Abd:      + bowel sounds; soft, non-tender; no palpable masses, no distension Ext:    No edema; adequate peripheral perfusion Skin:      Warm and dry; no rash Neuro: alert and oriented x 3 Psych: normal mood and affect  Data Reviewed: Imaging Chest x-ray 01/28/17-multiple nodular densities. CT chest 02/12/17-borderline precarinal lymph nodes, multiple pulmonary nodules bilaterally.  The largest one in the right  upper lobe measures 13 x11 mm. PET scan 02/27/17-dominant nodule in the right middle lobe has mild-moderate FDG uptake.  No other areas of uptake. CTA 05/17/17-no pulmonary embolism, stable lung nodules. CT scan 06/11/17-stability of pulmonary nodules the largest is 1.3 cm in the right middle lobe. CT scan 12/12/2017-  stable multiple bilateral nodules.  New compression deformity in L1. CT scan 12/16/2018- some of the pulmonary nodules for change in morphology to lobular with possible cavitation. CT scan 06/21/2019-stable pulmonary nodules.   Chest x-ray 01/14/2020-hyperinflated lungs, no acute process. I have reviewed the images personally.  Received reports (no images) from primary care office Chest x-ray 04/11/16 Stable right lung nodules likely granuloma.  No change compared to 03/31/15  PFTs  12/15/2017 FVC 0.95 (47%), FEV1 0.42 [28%), F/F 44, TLC 142%, DLCO 38% Severe obstruction, diffusion impairment  FENO 06/24/17-36  Labs CBC 06/24/2017-WBC 6.2, eos 1.5%, absolute eosinophil count 100 Blood allergy profile 06/24/2017-IgE to 32, sensitive to cat, Aspergillus, mouse urine Alpha-1 antitrypsin 12/15/2017-155, PI MM  Assessment:  Severe obstruction PFTs reviewed which shows severe obstruction which is surprising as she is a non-smoker. Alpha-1 antitrypsin levels are okay Continue Anoro, supplemental oxygen                                 Follow up for multiple pulmonary nodules Her images reviewed which shows stable rounded nodular opacities with mild PET activity.  Per report from primary care this has been present since at least 2017. This could be consistent with rheumatoid nodules which are are known to have low FDG uptake. The PET scan does not reveal any other abnormal areas of uptake.  She is a non-smoker with low risk of malignancy.    CT scan reviewed with stable appearance of pulmonary nodules which is good news. Follow-up CT scheduled for April 2022 She is not a good candidate for bronchoscopic or CT-guided biopsy given her age and frailty.  GERD Continue Pepcid at night  Plan/Recommendations: - Continue Anoro, supplemental oxygen - Follow-up CT chest April 2022  Marshell Garfinkel MD [facility] 03/08/2020, 12:21 PM  CC: Burnard Bunting, MD

## 2020-03-08 NOTE — Patient Instructions (Signed)
I am glad you are doing well after recent hospitalization Continue the Anoro inhaler, albuterol as needed and Flonase nasal spray.  Will call in prescriptions for those to [facility]  CT chest without contrast in April to follow-up lung nodules Return to clinic after CT

## 2020-03-09 ENCOUNTER — Encounter: Payer: Self-pay | Admitting: Pulmonary Disease

## 2020-05-08 DIAGNOSIS — I739 Peripheral vascular disease, unspecified: Secondary | ICD-10-CM | POA: Diagnosis not present

## 2020-05-08 DIAGNOSIS — L603 Nail dystrophy: Secondary | ICD-10-CM | POA: Diagnosis not present

## 2020-05-08 DIAGNOSIS — B351 Tinea unguium: Secondary | ICD-10-CM | POA: Diagnosis not present

## 2020-05-08 DIAGNOSIS — M79674 Pain in right toe(s): Secondary | ICD-10-CM | POA: Diagnosis not present

## 2020-05-10 DIAGNOSIS — E1129 Type 2 diabetes mellitus with other diabetic kidney complication: Secondary | ICD-10-CM | POA: Diagnosis not present

## 2020-05-10 DIAGNOSIS — R918 Other nonspecific abnormal finding of lung field: Secondary | ICD-10-CM | POA: Diagnosis not present

## 2020-05-10 DIAGNOSIS — N183 Chronic kidney disease, stage 3 unspecified: Secondary | ICD-10-CM | POA: Diagnosis not present

## 2020-05-10 DIAGNOSIS — I129 Hypertensive chronic kidney disease with stage 1 through stage 4 chronic kidney disease, or unspecified chronic kidney disease: Secondary | ICD-10-CM | POA: Diagnosis not present

## 2020-05-17 DIAGNOSIS — B351 Tinea unguium: Secondary | ICD-10-CM | POA: Diagnosis not present

## 2020-06-19 ENCOUNTER — Other Ambulatory Visit: Payer: Self-pay | Admitting: Pulmonary Disease

## 2020-06-23 ENCOUNTER — Ambulatory Visit
Admission: RE | Admit: 2020-06-23 | Discharge: 2020-06-23 | Disposition: A | Discharge status: Home / Self Care | Payer: Medicare Other | Source: Ambulatory Visit | Attending: Pulmonary Disease | Admitting: Pulmonary Disease

## 2020-06-23 DIAGNOSIS — R911 Solitary pulmonary nodule: Secondary | ICD-10-CM

## 2020-06-23 DIAGNOSIS — R918 Other nonspecific abnormal finding of lung field: Secondary | ICD-10-CM | POA: Diagnosis not present

## 2020-07-11 ENCOUNTER — Other Ambulatory Visit: Payer: Self-pay

## 2020-07-11 ENCOUNTER — Encounter: Payer: Self-pay | Admitting: Pulmonary Disease

## 2020-07-11 ENCOUNTER — Ambulatory Visit (INDEPENDENT_AMBULATORY_CARE_PROVIDER_SITE_OTHER): Payer: Medicare Other | Admitting: Pulmonary Disease

## 2020-07-11 VITALS — BP 136/62 | HR 86 | Temp 97.3°F | Ht 59.0 in | Wt 110.6 lb

## 2020-07-11 DIAGNOSIS — R918 Other nonspecific abnormal finding of lung field: Secondary | ICD-10-CM

## 2020-07-11 DIAGNOSIS — J449 Chronic obstructive pulmonary disease, unspecified: Secondary | ICD-10-CM | POA: Diagnosis not present

## 2020-07-11 NOTE — Patient Instructions (Signed)
I have reviewed your CT scan which shows the lung nodules are stable and likely benign  You can follow-up with CT chest without contrast in 2 years Continue Anoro inhaler Follow-up in clinic in 1 year.

## 2020-07-11 NOTE — Progress Notes (Signed)
Heidi Moran    [id number]    Sep 07, 1935  Primary Care Physician:Aronson, Delfino Lovett, MD  Referring Physician: Burnard Bunting, MD [address] [place],  [place] [address]  Chief complaint:  Follow up for  Rheumatoid lung nodules Hypoxic respiratory failure on oxygen.  HPI: 85 year old with history of rheumatoid arthritis, diabetes, hyperlipidemia, hypertension.  She was evaluated in the ED recently fatigue, cough with a chest x-ray that showed lung nodules.  She had a follow-up CT scan and PET scan which demonstrated multiple pulmonary nodules, mild uptake in the dominant nodule.  [Reports below]. She has history of rheumatoid arthritis diagnosed about in 2018.  She follows with Dr. Trudie Reed, previously treated with Golinumab infusions which was held after she developed pneumonia.  Hospitalized in March 2019 for acute respiratory failure with bronchospasm and hypoxia, upper respiratory tract infection. Flu test was negative, CT chest showed no pulmonary embolism or acute lung abnormality Treated with steroids with improvement in symptoms and discharged on supplemental oxygen to SNF.  Hospitalized in November 2021 for dyspnea, acute on chronic respiratory failure, suspected COPD exacerbation Treated with prednisone and started on Anoro.  Received note from Dr. Trudie Reed, rheumatology dated 10/15/2017 Follow-up for rheumatoid arthritis with stable symptoms, no flareups Previously on Golimumab but currently not on therapy.  Pets: Cats.  No birds, farm animals Occupation: Housewife Exposures: None exposures Smoking history: Never smoker Travel History:Grew up in [place].  Moved to [place] 35 years ago.  Interim history: Continues on Anoro. Has good days bad days.  Overall stable since prior visit.  Continues on supplemental oxygen  Outpatient Encounter Medications as of 07/11/2020  Medication Sig  . albuterol (VENTOLIN HFA) 108 (90 Base) MCG/ACT inhaler Inhale 2  puffs into the lungs every 6 (six) hours as needed for wheezing or shortness of breath.  Marland Kitchen atorvastatin (LIPITOR) 20 MG tablet Take 20 mg by mouth daily.  . Calcium-Vitamin D-Vitamin K (VIACTIV PO) Take 1 tablet by mouth daily.  . famotidine (PEPCID) 20 MG tablet TAKE 1 TABLET AT BEDTIME  . fluticasone (FLONASE) 50 MCG/ACT nasal spray Place 1 spray into both nostrils daily.  Marland Kitchen glyBURIDE-metformin (GLUCOVANCE) 2.5-500 MG tablet Take 1 tablet by mouth daily with breakfast. Pt takes one in the morning with breakfast and one in the evening  . lisinopril (ZESTRIL) 10 MG tablet Take 1 tablet (10 mg total) by mouth daily.  . OXYGEN Inhale 2 L/day into the lungs daily.  Marland Kitchen umeclidinium-vilanterol (ANORO ELLIPTA) 62.5-25 MCG/INH AEPB Inhale 1 puff into the lungs daily.  . [DISCONTINUED] OVER THE COUNTER MEDICATION    No facility-administered encounter medications on file as of 07/11/2020.   Physical Exam: Blood pressure 136/62, pulse 86, temperature (!) 97.3 F (36.3 C), temperature source Temporal, height 4\' 11"  (1.499 m), weight 110 lb 9.6 oz (50.2 kg), SpO2 99 %. Gen:      No acute distress HEENT:  EOMI, sclera anicteric Neck:     No masses; no thyromegaly Lungs:    Clear to auscultation bilaterally; normal respiratory effort CV:         Regular rate and rhythm; no murmurs Abd:      + bowel sounds; soft, non-tender; no palpable masses, no distension Ext:    No edema; adequate peripheral perfusion Skin:      Warm and dry; no rash Neuro: alert and oriented x 3 Psych: normal mood and affect  Data Reviewed: Imaging CT chest 02/12/17-borderline precarinal lymph nodes, multiple pulmonary nodules  bilaterally.  The largest one in the right upper lobe measures 13 x11 mm. PET scan 02/27/17-dominant nodule in the right middle lobe has mild-moderate FDG uptake.  No other areas of uptake. CTA 05/17/17-no pulmonary embolism, stable lung nodules. CT scan 06/11/17-stability of pulmonary nodules the largest is  1.3 cm in the right middle lobe. CT scan 12/12/2017- stable multiple bilateral nodules.  New compression deformity in L1. CT scan 12/16/2018- some of the pulmonary nodules for change in morphology to lobular with possible cavitation. CT scan 06/21/2019-stable pulmonary nodules.   CT chest 06/23/2020-stable pulmonary nodules I have reviewed the images personally.  PFTs  12/15/2017 FVC 0.95 (47%), FEV1 0.42 [28%), F/F 44, TLC 142%, DLCO 38% Severe obstruction, diffusion impairment  FENO 06/24/17-36  Labs CBC 06/24/2017-WBC 6.2, eos 1.5%, absolute eosinophil count 100 Blood allergy profile 06/24/2017-IgE to 32, sensitive to cat, Aspergillus, mouse urine Alpha-1 antitrypsin 12/15/2017-155, PI MM  Assessment:  Severe obstruction PFTs reviewed which shows severe obstruction which is surprising as she is a non-smoker. Alpha-1 antitrypsin levels are okay Continue Anoro, supplemental oxygen                                 Follow up for multiple pulmonary nodules Her images reviewed which shows stable rounded nodular opacities with mild PET activity.  Per report from primary care this has been present since at least 2017. This could be consistent with rheumatoid nodules which are are known to have low FDG uptake. The PET scan does not reveal any other abnormal areas of uptake.  She is a non-smoker with low risk of malignancy.    CT scan reviewed with stable appearance of pulmonary nodules which is good news. Follow-up CT in 2 years.  If stable at that point we can stop following  GERD Continue Pepcid at night  Plan/Recommendations: - Continue Anoro, supplemental oxygen - Follow-up CT chest in 2 years  Marshell Garfinkel MD [facility] 07/11/2020, 11:54 AM  CC: Burnard Bunting, MD

## 2020-07-17 DIAGNOSIS — E1351 Other specified diabetes mellitus with diabetic peripheral angiopathy without gangrene: Secondary | ICD-10-CM | POA: Diagnosis not present

## 2020-07-17 DIAGNOSIS — L602 Onychogryphosis: Secondary | ICD-10-CM | POA: Diagnosis not present

## 2020-09-07 DIAGNOSIS — E119 Type 2 diabetes mellitus without complications: Secondary | ICD-10-CM | POA: Diagnosis not present

## 2020-09-20 DIAGNOSIS — N183 Chronic kidney disease, stage 3 unspecified: Secondary | ICD-10-CM | POA: Diagnosis not present

## 2020-09-20 DIAGNOSIS — L309 Dermatitis, unspecified: Secondary | ICD-10-CM | POA: Diagnosis not present

## 2020-09-20 DIAGNOSIS — R5383 Other fatigue: Secondary | ICD-10-CM | POA: Diagnosis not present

## 2020-09-20 DIAGNOSIS — E1129 Type 2 diabetes mellitus with other diabetic kidney complication: Secondary | ICD-10-CM | POA: Diagnosis not present

## 2020-09-20 DIAGNOSIS — D8989 Other specified disorders involving the immune mechanism, not elsewhere classified: Secondary | ICD-10-CM | POA: Diagnosis not present

## 2020-09-20 DIAGNOSIS — I129 Hypertensive chronic kidney disease with stage 1 through stage 4 chronic kidney disease, or unspecified chronic kidney disease: Secondary | ICD-10-CM | POA: Diagnosis not present

## 2020-09-20 DIAGNOSIS — R918 Other nonspecific abnormal finding of lung field: Secondary | ICD-10-CM | POA: Diagnosis not present

## 2020-10-04 ENCOUNTER — Other Ambulatory Visit: Payer: Self-pay

## 2020-10-04 ENCOUNTER — Ambulatory Visit (INDEPENDENT_AMBULATORY_CARE_PROVIDER_SITE_OTHER): Payer: Medicare Other | Admitting: Podiatry

## 2020-10-04 ENCOUNTER — Encounter: Payer: Self-pay | Admitting: Podiatry

## 2020-10-04 DIAGNOSIS — E1165 Type 2 diabetes mellitus with hyperglycemia: Secondary | ICD-10-CM

## 2020-10-04 DIAGNOSIS — B351 Tinea unguium: Secondary | ICD-10-CM

## 2020-10-04 DIAGNOSIS — M79674 Pain in right toe(s): Secondary | ICD-10-CM | POA: Insufficient documentation

## 2020-10-04 DIAGNOSIS — M79675 Pain in left toe(s): Secondary | ICD-10-CM

## 2020-10-04 NOTE — Progress Notes (Signed)
This patient returns to my office for at risk foot care.  This patient requires this care by a professional since this patient will be at risk due to having diabetes diet controlled  This patient is unable to cut nails herself since the patient cannot reach her nails.These nails are painful walking and wearing shoes.  This patient presents for at risk foot care today.  General Appearance  Alert, conversant and in no acute stress.  Vascular  Dorsalis pedis and posterior tibial  pulses are absent  bilaterally.  Capillary return is within normal limits  bilaterally. Temperature is within normal limits  bilaterally. Venous pathology ankles  B/L.  Neurologic  Senn-Weinstein monofilament wire test within normal limits .  LOPS diminished left. Muscle power within normal limits bilaterally.  Nails Thick disfigured discolored nails with subungual debris  from hallux to fifth toes bilaterally. No evidence of bacterial infection or drainage bilaterally.  Orthopedic  No limitations of motion  feet .  No crepitus or effusions noted.  No bony pathology or digital deformities noted.  Skin  normotropic skin with no porokeratosis noted bilaterally.  No signs of infections or ulcers noted.     Onychomycosis  Pain in right toes  Pain in left toes  Consent was obtained for treatment procedures.   Mechanical debridement of nails 1-5  bilaterally performed with a nail nipper.  Filed with dremel without incident.    Return office visit   4 months                   Told patient to return for periodic foot care and evaluation due to potential at risk complications.   Gardiner Barefoot DPM

## 2020-12-16 ENCOUNTER — Emergency Department (HOSPITAL_BASED_OUTPATIENT_CLINIC_OR_DEPARTMENT_OTHER)
Admission: EM | Admit: 2020-12-16 | Discharge: 2020-12-16 | Disposition: A | Discharge status: Home / Self Care | Payer: Medicare Other | Attending: Emergency Medicine | Admitting: Emergency Medicine

## 2020-12-16 ENCOUNTER — Encounter (HOSPITAL_BASED_OUTPATIENT_CLINIC_OR_DEPARTMENT_OTHER): Payer: Self-pay

## 2020-12-16 ENCOUNTER — Emergency Department (HOSPITAL_BASED_OUTPATIENT_CLINIC_OR_DEPARTMENT_OTHER): Payer: Medicare Other | Admitting: Radiology

## 2020-12-16 ENCOUNTER — Emergency Department (HOSPITAL_BASED_OUTPATIENT_CLINIC_OR_DEPARTMENT_OTHER): Payer: Medicare Other

## 2020-12-16 ENCOUNTER — Other Ambulatory Visit: Payer: Self-pay

## 2020-12-16 DIAGNOSIS — W19XXXA Unspecified fall, initial encounter: Secondary | ICD-10-CM | POA: Insufficient documentation

## 2020-12-16 DIAGNOSIS — R911 Solitary pulmonary nodule: Secondary | ICD-10-CM | POA: Diagnosis not present

## 2020-12-16 DIAGNOSIS — Z7951 Long term (current) use of inhaled steroids: Secondary | ICD-10-CM | POA: Diagnosis not present

## 2020-12-16 DIAGNOSIS — S42034A Nondisplaced fracture of lateral end of right clavicle, initial encounter for closed fracture: Secondary | ICD-10-CM | POA: Insufficient documentation

## 2020-12-16 DIAGNOSIS — I10 Essential (primary) hypertension: Secondary | ICD-10-CM | POA: Insufficient documentation

## 2020-12-16 DIAGNOSIS — Z7984 Long term (current) use of oral hypoglycemic drugs: Secondary | ICD-10-CM | POA: Insufficient documentation

## 2020-12-16 DIAGNOSIS — Z043 Encounter for examination and observation following other accident: Secondary | ICD-10-CM | POA: Diagnosis not present

## 2020-12-16 DIAGNOSIS — E119 Type 2 diabetes mellitus without complications: Secondary | ICD-10-CM | POA: Insufficient documentation

## 2020-12-16 DIAGNOSIS — S42031A Displaced fracture of lateral end of right clavicle, initial encounter for closed fracture: Secondary | ICD-10-CM | POA: Diagnosis not present

## 2020-12-16 DIAGNOSIS — Z79899 Other long term (current) drug therapy: Secondary | ICD-10-CM | POA: Diagnosis not present

## 2020-12-16 DIAGNOSIS — J441 Chronic obstructive pulmonary disease with (acute) exacerbation: Secondary | ICD-10-CM | POA: Diagnosis not present

## 2020-12-16 DIAGNOSIS — S4991XA Unspecified injury of right shoulder and upper arm, initial encounter: Secondary | ICD-10-CM | POA: Diagnosis present

## 2020-12-16 HISTORY — DX: Rheumatoid arthritis, unspecified: M06.9

## 2020-12-16 MED ORDER — ACETAMINOPHEN 325 MG PO TABS
650.0000 mg | ORAL_TABLET | Freq: Once | ORAL | Status: AC
  Administered 2020-12-16: 650 mg via ORAL
  Filled 2020-12-16: qty 2

## 2020-12-16 MED ORDER — IBUPROFEN 400 MG PO TABS
600.0000 mg | ORAL_TABLET | Freq: Once | ORAL | Status: AC
  Administered 2020-12-16: 600 mg via ORAL
  Filled 2020-12-16: qty 1

## 2020-12-16 MED ORDER — ACETAMINOPHEN 325 MG PO TABS: 650.0000 mg | ORAL_TABLET | Freq: Four times a day (QID) | ORAL | 0 refills | Status: DC | PRN

## 2020-12-16 MED ORDER — IBUPROFEN 600 MG PO TABS
600.0000 mg | ORAL_TABLET | Freq: Three times a day (TID) | ORAL | 0 refills | Status: DC | PRN
Start: 2020-12-16 — End: 2021-08-31

## 2020-12-16 NOTE — ED Provider Notes (Signed)
[facility] EMERGENCY DEPT Provider Note   CSN: [id number] Arrival date & time: 12/16/20  [place]     History Chief Complaint  Patient presents with   Fall   Shoulder Injury    Heidi Moran is a 85 y.o. female present emerged part with a fall and right shoulder pain.  The patient has chronic pulmonary nodules, COPD, on 2 to 3 L nasal cannula baseline.  She reports that she frequently falls asleep while sitting at the table, and often is fallen over.  This happened again tonight.  She says she fell asleep at the table and woke up after she fell onto her right side.  She describing pain in her right shoulder.  Pain is worse with overhead arm movement.  She denies any prior history of surgeries or broken bones in her shoulder.  The pain is currently mild to moderate intensity.  It does not radiate down her arm.  She has never had a before.  She has not taken any medications for this today.  She does not take chronic pain medicines.  She denies any other injuries.  HPI     Past Medical History:  Diagnosis Date   Anemia    Diabetes mellitus without complication ([place])    Eczema    Hyperlipidemia    Hypertension    Osteoarthritis    Renal disorder    Rheumatoid arthritis [facility])     Patient Active Problem List   Diagnosis Date Noted   Pain due to onychomycosis of toenails of both feet 10/04/2020   Obstructive airway disease ([place]) 02/04/2020   Abnormal findings on diagnostic imaging of lung 02/04/2020   Chronic respiratory failure with hypoxia ([place]) 01/15/2020   Controlled type 2 diabetes mellitus with hyperglycemia, without long-term current use of insulin ([place]) 01/15/2020   Mixed diabetic hyperlipidemia associated with type 2 diabetes mellitus ([place]) 01/15/2020   GERD without esophagitis 01/15/2020   Normocytic anemia 01/15/2020   COPD exacerbation ([place]) 01/15/2020   Essential hypertension 05/18/2017    No past surgical history on file.   OB History   No obstetric  history on file.     Family History  Problem Relation Age of Onset   Heart disease Neg Hx     Social History   Tobacco Use   Smoking status: Never   Smokeless tobacco: Never  Substance Use Topics   Alcohol use: No   Drug use: No    Home Medications Prior to Admission medications   Medication Sig Start Date End Date Taking? Authorizing Provider  acetaminophen (TYLENOL) 325 MG tablet Take 2 tablets (650 mg total) by mouth every 6 (six) hours as needed for up to 30 doses for moderate pain or mild pain. 12/16/20  Yes Markeita Alicia, Carola Rhine, MD  albuterol (VENTOLIN HFA) 108 (90 Base) MCG/ACT inhaler Inhale 2 puffs into the lungs every 6 (six) hours as needed for wheezing or shortness of breath. 03/08/20   Mannam, Hart Robinsons, MD  atorvastatin (LIPITOR) 20 MG tablet Take 20 mg by mouth daily.    [provider]  Calcium-Vitamin D-Vitamin K (VIACTIV PO) Take 1 tablet by mouth daily.    [provider]  famotidine (PEPCID) 20 MG tablet TAKE 1 TABLET AT BEDTIME 06/20/20   Mannam, Praveen, MD  fluticasone (FLONASE) 50 MCG/ACT nasal spray Place 1 spray into both nostrils daily. 03/08/20 03/08/21  Mannam, Hart Robinsons, MD  glyBURIDE-metformin (GLUCOVANCE) 2.5-500 MG tablet Take 1 tablet by mouth daily with breakfast. Pt takes one in  the morning with breakfast and one in the evening    [provider]  ibuprofen (ADVIL) 600 MG tablet Take 1 tablet (600 mg total) by mouth every 8 (eight) hours as needed for up to 21 doses for mild pain or moderate pain. Take with food 12/16/20  Yes Kalim Kissel, Carola Rhine, MD  lisinopril (ZESTRIL) 10 MG tablet Take 1 tablet (10 mg total) by mouth daily. 01/21/20   Mercy Riding, MD  OXYGEN Inhale 2 L/day into the lungs daily.    [provider]  umeclidinium-vilanterol (ANORO ELLIPTA) 62.5-25 MCG/INH AEPB Inhale 1 puff into the lungs daily. 03/08/20   Marshell Garfinkel, MD    Allergies    Patient has no known allergies.  Review of Systems   Review  of Systems  Constitutional:  Negative for chills and fever.  Cardiovascular:  Negative for chest pain and palpitations.  Gastrointestinal:  Negative for abdominal pain and vomiting.  Genitourinary:  Negative for dysuria and hematuria.  Musculoskeletal:  Positive for arthralgias and myalgias.  Skin:  Negative for rash and wound.  Neurological:  Negative for weakness and numbness.  All other systems reviewed and are negative.  Physical Exam Updated Vital Signs BP (!) 146/84   Pulse 71   Temp 97.9 F (36.6 C) (Oral)   Resp 20   SpO2 100%   Physical Exam Constitutional:      General: She is not in acute distress. HENT:     Head: Normocephalic and atraumatic.  Eyes:     Conjunctiva/sclera: Conjunctivae normal.     Pupils: Pupils are equal, round, and reactive to light.  Cardiovascular:     Rate and Rhythm: Normal rate and regular rhythm.  Pulmonary:     Effort: Pulmonary effort is normal. No respiratory distress.     Comments: On 2L [place] (baseline) Diminished BS left lower lung  Musculoskeletal:     Comments: No visible deformity of right shoulder, no ecchymosis, no collar bone tenderness No tenderness of the humerus or elbow  Skin:    General: Skin is warm and dry.  Neurological:     General: No focal deficit present.     Mental Status: She is alert. Mental status is at baseline.  Psychiatric:        Mood and Affect: Mood normal.        Behavior: Behavior normal.    ED Results / Procedures / Treatments   Labs (all labs ordered are listed, but only abnormal results are displayed) Labs Reviewed - No data to display  EKG None  Radiology DG Shoulder Right  Result Date: 12/16/2020 CLINICAL DATA:  Fall with shoulder pain. EXAM: RIGHT SHOULDER - 2+ VIEW COMPARISON:  Same day chest radiograph. FINDINGS: There is a mildly displaced fracture of the distal third of the right clavicle. This does not appear to extend to the acromioclavicular joint space. There is no significant  arthropathy. There is diffuse osseous demineralization. There is no joint dislocation. IMPRESSION: Mildly displaced fracture of the distal third of the right clavicle. Electronically Signed   By: Zerita Boers M.D.   On: 12/16/2020 19:24   DG Chest Portable 1 View  Result Date: 12/16/2020 CLINICAL DATA:  Right shoulder pain after a fall. EXAM: PORTABLE CHEST 1 VIEW COMPARISON:  Chest radiograph dated 01/13/2021. FINDINGS: The heart size is normal. Vascular calcifications are seen in the aortic arch. Multiple pulmonary nodules are redemonstrated with the largest measuring 1.2 cm in the right lower lung. Otherwise, the lungs are  clear. Degenerative changes are seen in the spine. A mildly displaced fracture of the distal third of the right clavicle is noted. IMPRESSION: Mildly displaced fracture of the distal third of the right clavicle. No acute pulmonary process. Aortic Atherosclerosis (ICD10-I70.0). Electronically Signed   By: Zerita Boers M.D.   On: 12/16/2020 19:26    Procedures Procedures   Medications Ordered in ED Medications  acetaminophen (TYLENOL) tablet 650 mg (650 mg Oral Given 12/16/20 1915)  ibuprofen (ADVIL) tablet 600 mg (600 mg Oral Given 12/16/20 1914)    ED Course  I have reviewed the triage vital signs and the nursing notes.  Pertinent labs & imaging results that were available during my care of the patient were reviewed by me and considered in my medical decision making (see chart for details  Patient is here with a mechanical fall today, isolated right shoulder injury.  I ordered and personally reviewed her x-ray films including x-ray of the chest and the right shoulder, which showed distal right-sided clavicle fracture, closed fracture.  Otherwise she has continued stable pulmonary nodules, no other acute findings intrathoracic [place] per my interpretation of her chest x-ray.  Patient was given Tylenol for pain as well as Motrin.Marland Kitchen  She was otherwise hemodynamically stable on  baseline O2 level.  She is here with her husband who is able to help care for her at home.  She does not use a walker or cane at baseline.   Clinical Course as of 12/16/20 2248  Sat Dec 16, 2020  2022 Review discharge instructions the patient and her husband.  We will apply a sling for her collarbone.  She can take Tylenol and ibuprofen (sparingly) for her pain at home.  She will follow-up with an orthopedist in 1-2 weeks. [MT]    Clinical Course User Index [MT] Wyvonnia Dusky, MD    Final Clinical Impression(s) / ED Diagnoses Final diagnoses:  Closed nondisplaced fracture of acromial end of right clavicle, initial encounter    Rx / DC Orders ED Discharge Orders          Ordered    acetaminophen (TYLENOL) 325 MG tablet  Every 6 hours PRN        12/16/20 2022    ibuprofen (ADVIL) 600 MG tablet  Every 8 hours PRN        12/16/20 2022             Wyvonnia Dusky, MD 12/16/20 2248

## 2020-12-16 NOTE — ED Triage Notes (Signed)
She tells me that she "fell asleep at the table, and I fell onto my right side". She arrives on her usual con't. O2 therap;y at 2 l.p.m. she denies any other areas of pain or injury, including head and neck. Her husband is with her.

## 2020-12-20 DIAGNOSIS — S42034A Nondisplaced fracture of lateral end of right clavicle, initial encounter for closed fracture: Secondary | ICD-10-CM | POA: Diagnosis not present

## 2021-01-01 DIAGNOSIS — I10 Essential (primary) hypertension: Secondary | ICD-10-CM | POA: Diagnosis not present

## 2021-01-01 DIAGNOSIS — E11319 Type 2 diabetes mellitus with unspecified diabetic retinopathy without macular edema: Secondary | ICD-10-CM | POA: Diagnosis not present

## 2021-01-01 DIAGNOSIS — M859 Disorder of bone density and structure, unspecified: Secondary | ICD-10-CM | POA: Diagnosis not present

## 2021-01-01 DIAGNOSIS — E785 Hyperlipidemia, unspecified: Secondary | ICD-10-CM | POA: Diagnosis not present

## 2021-01-08 DIAGNOSIS — Z79899 Other long term (current) drug therapy: Secondary | ICD-10-CM | POA: Diagnosis not present

## 2021-01-08 DIAGNOSIS — Z1389 Encounter for screening for other disorder: Secondary | ICD-10-CM | POA: Diagnosis not present

## 2021-01-08 DIAGNOSIS — D638 Anemia in other chronic diseases classified elsewhere: Secondary | ICD-10-CM | POA: Diagnosis not present

## 2021-01-08 DIAGNOSIS — E11319 Type 2 diabetes mellitus with unspecified diabetic retinopathy without macular edema: Secondary | ICD-10-CM | POA: Diagnosis not present

## 2021-01-08 DIAGNOSIS — I129 Hypertensive chronic kidney disease with stage 1 through stage 4 chronic kidney disease, or unspecified chronic kidney disease: Secondary | ICD-10-CM | POA: Diagnosis not present

## 2021-01-08 DIAGNOSIS — Z1331 Encounter for screening for depression: Secondary | ICD-10-CM | POA: Diagnosis not present

## 2021-01-08 DIAGNOSIS — M353 Polymyalgia rheumatica: Secondary | ICD-10-CM | POA: Diagnosis not present

## 2021-01-08 DIAGNOSIS — I10 Essential (primary) hypertension: Secondary | ICD-10-CM | POA: Diagnosis not present

## 2021-01-08 DIAGNOSIS — R82998 Other abnormal findings in urine: Secondary | ICD-10-CM | POA: Diagnosis not present

## 2021-01-08 DIAGNOSIS — E1129 Type 2 diabetes mellitus with other diabetic kidney complication: Secondary | ICD-10-CM | POA: Diagnosis not present

## 2021-01-08 DIAGNOSIS — Z0000 Encounter for general adult medical examination without abnormal findings: Secondary | ICD-10-CM | POA: Diagnosis not present

## 2021-01-10 ENCOUNTER — Encounter: Payer: Self-pay | Admitting: Podiatry

## 2021-01-10 ENCOUNTER — Ambulatory Visit (INDEPENDENT_AMBULATORY_CARE_PROVIDER_SITE_OTHER): Payer: Medicare Other | Admitting: Podiatry

## 2021-01-10 ENCOUNTER — Other Ambulatory Visit: Payer: Self-pay

## 2021-01-10 DIAGNOSIS — B351 Tinea unguium: Secondary | ICD-10-CM | POA: Diagnosis not present

## 2021-01-10 DIAGNOSIS — E1165 Type 2 diabetes mellitus with hyperglycemia: Secondary | ICD-10-CM | POA: Diagnosis not present

## 2021-01-10 DIAGNOSIS — M79674 Pain in right toe(s): Secondary | ICD-10-CM

## 2021-01-10 DIAGNOSIS — M79675 Pain in left toe(s): Secondary | ICD-10-CM

## 2021-01-10 NOTE — Progress Notes (Signed)
This patient returns to my office for at risk foot care.  This patient requires this care by a professional since this patient will be at risk due to having diabetes diet controlled  This patient is unable to cut nails herself since the patient cannot reach her nails.These nails are painful walking and wearing shoes.  This patient presents for at risk foot care today.  General Appearance  Alert, conversant and in no acute stress.  Vascular  Dorsalis pedis and posterior tibial  pulses are absent  bilaterally.  Capillary return is within normal limits  bilaterally. Temperature is within normal limits  bilaterally. Venous pathology ankles  B/L.  Neurologic  Senn-Weinstein monofilament wire test within normal limits .  LOPS diminished left. Muscle power within normal limits bilaterally.  Nails Thick disfigured discolored nails with subungual debris  from hallux to fifth toes bilaterally. No evidence of bacterial infection or drainage bilaterally.  Orthopedic  No limitations of motion  feet .  No crepitus or effusions noted.  No bony pathology or digital deformities noted.  Skin  normotropic skin with no porokeratosis noted bilaterally.  No signs of infections or ulcers noted.     Onychomycosis  Pain in right toes  Pain in left toes  Consent was obtained for treatment procedures.   Mechanical debridement of nails 1-5  bilaterally performed with a nail nipper.  Filed with dremel without incident.    Return office visit   3  months                   Told patient to return for periodic foot care and evaluation due to potential at risk complications.   Dandrea Widdowson DPM   

## 2021-01-17 DIAGNOSIS — Z9889 Other specified postprocedural states: Secondary | ICD-10-CM | POA: Diagnosis not present

## 2021-01-17 DIAGNOSIS — S42034A Nondisplaced fracture of lateral end of right clavicle, initial encounter for closed fracture: Secondary | ICD-10-CM | POA: Diagnosis not present

## 2021-02-28 ENCOUNTER — Encounter: Payer: Self-pay | Admitting: Pulmonary Disease

## 2021-02-28 DIAGNOSIS — U071 COVID-19: Secondary | ICD-10-CM | POA: Diagnosis not present

## 2021-02-28 DIAGNOSIS — Z20822 Contact with and (suspected) exposure to covid-19: Secondary | ICD-10-CM | POA: Diagnosis not present

## 2021-03-01 NOTE — Telephone Encounter (Signed)
PM please advise. Thanks  Took the Covid-19 rapid test today, 02/28/21, at [facility] and tested positive.  They sent a prescription for Paxlovid.  Is there anything else I should do besides isolate? Denyse Amass

## 2021-03-01 NOTE — Telephone Encounter (Signed)
Paxlovid is the correct treatment Would advise isolation, and rest, stay well-hydrated

## 2021-03-20 DIAGNOSIS — E119 Type 2 diabetes mellitus without complications: Secondary | ICD-10-CM | POA: Diagnosis not present

## 2021-03-20 DIAGNOSIS — H524 Presbyopia: Secondary | ICD-10-CM | POA: Diagnosis not present

## 2021-03-20 DIAGNOSIS — Z961 Presence of intraocular lens: Secondary | ICD-10-CM | POA: Diagnosis not present

## 2021-03-23 DIAGNOSIS — M25511 Pain in right shoulder: Secondary | ICD-10-CM | POA: Diagnosis not present

## 2021-04-18 ENCOUNTER — Encounter: Payer: Self-pay | Admitting: Podiatry

## 2021-04-18 ENCOUNTER — Ambulatory Visit (INDEPENDENT_AMBULATORY_CARE_PROVIDER_SITE_OTHER): Payer: Medicare Other | Admitting: Podiatry

## 2021-04-18 ENCOUNTER — Other Ambulatory Visit: Payer: Self-pay

## 2021-04-18 DIAGNOSIS — B351 Tinea unguium: Secondary | ICD-10-CM | POA: Diagnosis not present

## 2021-04-18 DIAGNOSIS — E1165 Type 2 diabetes mellitus with hyperglycemia: Secondary | ICD-10-CM

## 2021-04-18 DIAGNOSIS — M79675 Pain in left toe(s): Secondary | ICD-10-CM | POA: Diagnosis not present

## 2021-04-18 DIAGNOSIS — M79674 Pain in right toe(s): Secondary | ICD-10-CM

## 2021-04-18 NOTE — Progress Notes (Signed)
This patient returns to my office for at risk foot care.  This patient requires this care by a professional since this patient will be at risk due to having diabetes diet controlled  This patient is unable to cut nails herself since the patient cannot reach her nails.These nails are painful walking and wearing shoes.  This patient presents for at risk foot care today.  General Appearance  Alert, conversant and in no acute stress.  Vascular  Dorsalis pedis and posterior tibial  pulses are absent  bilaterally.  Capillary return is within normal limits  bilaterally. Temperature is within normal limits  bilaterally. Venous pathology ankles  B/L.  Neurologic  Senn-Weinstein monofilament wire test within normal limits .  LOPS diminished left. Muscle power within normal limits bilaterally.  Nails Thick disfigured discolored nails with subungual debris  from hallux to fifth toes bilaterally. No evidence of bacterial infection or drainage bilaterally.  Orthopedic  No limitations of motion  feet .  No crepitus or effusions noted.  No bony pathology or digital deformities noted.  Skin  normotropic skin with no porokeratosis noted bilaterally.  No signs of infections or ulcers noted.     Onychomycosis  Pain in right toes  Pain in left toes  Consent was obtained for treatment procedures.   Mechanical debridement of nails 1-5  bilaterally performed with a nail nipper.  Filed with dremel without incident.    Return office visit   3  months                   Told patient to return for periodic foot care and evaluation due to potential at risk complications.   Brendaliz Kuk DPM   

## 2021-05-30 DIAGNOSIS — E1129 Type 2 diabetes mellitus with other diabetic kidney complication: Secondary | ICD-10-CM | POA: Diagnosis not present

## 2021-05-30 DIAGNOSIS — I129 Hypertensive chronic kidney disease with stage 1 through stage 4 chronic kidney disease, or unspecified chronic kidney disease: Secondary | ICD-10-CM | POA: Diagnosis not present

## 2021-05-30 DIAGNOSIS — N182 Chronic kidney disease, stage 2 (mild): Secondary | ICD-10-CM | POA: Diagnosis not present

## 2021-05-30 DIAGNOSIS — D638 Anemia in other chronic diseases classified elsewhere: Secondary | ICD-10-CM | POA: Diagnosis not present

## 2021-06-04 ENCOUNTER — Other Ambulatory Visit: Payer: Self-pay | Admitting: Pulmonary Disease

## 2021-06-15 ENCOUNTER — Other Ambulatory Visit: Payer: Self-pay | Admitting: Pulmonary Disease

## 2021-07-04 ENCOUNTER — Telehealth: Payer: Self-pay | Admitting: Pulmonary Disease

## 2021-07-04 NOTE — Telephone Encounter (Signed)
Called and spoke with Shanon Brow (DPR).  Shanon Brow stated patient received prescriptions from mail order pharmacy, but was advised OV was needed for any future refills.  Patient scheduled 08/06/21 at 1415 for 1 year follow up with Dr. Vaughan Browner.  Nothing further at this time. ?

## 2021-07-04 NOTE — Telephone Encounter (Signed)
Mr Heidi Moran called on behalf of his wife seeking advice for medication evaluation. He states that they need a refill but the pharmacy made them aware that for next refill they have to go to doctor first. He asked if a nurse could call to comfirm before booking appoitment  ?

## 2021-07-18 ENCOUNTER — Encounter: Payer: Self-pay | Admitting: Podiatry

## 2021-07-18 ENCOUNTER — Ambulatory Visit (INDEPENDENT_AMBULATORY_CARE_PROVIDER_SITE_OTHER): Payer: Medicare Other | Admitting: Podiatry

## 2021-07-18 DIAGNOSIS — E1165 Type 2 diabetes mellitus with hyperglycemia: Secondary | ICD-10-CM

## 2021-07-18 DIAGNOSIS — M79674 Pain in right toe(s): Secondary | ICD-10-CM | POA: Diagnosis not present

## 2021-07-18 DIAGNOSIS — M79675 Pain in left toe(s): Secondary | ICD-10-CM | POA: Diagnosis not present

## 2021-07-18 DIAGNOSIS — B351 Tinea unguium: Secondary | ICD-10-CM | POA: Diagnosis not present

## 2021-07-18 NOTE — Progress Notes (Signed)
This patient returns to my office for at risk foot care.  This patient requires this care by a professional since this patient will be at risk due to having diabetes diet controlled  This patient is unable to cut nails herself since the patient cannot reach her nails.These nails are painful walking and wearing shoes.  This patient presents for at risk foot care today.  General Appearance  Alert, conversant and in no acute stress.  Vascular  Dorsalis pedis and posterior tibial  pulses are absent  bilaterally.  Capillary return is within normal limits  bilaterally. Temperature is within normal limits  bilaterally. Venous pathology ankles  B/L.  Neurologic  Senn-Weinstein monofilament wire test within normal limits .  LOPS diminished left. Muscle power within normal limits bilaterally.  Nails Thick disfigured discolored nails with subungual debris  from hallux to fifth toes bilaterally. No evidence of bacterial infection or drainage bilaterally.  Orthopedic  No limitations of motion  feet .  No crepitus or effusions noted.  No bony pathology or digital deformities noted.  Skin  normotropic skin with no porokeratosis noted bilaterally.  No signs of infections or ulcers noted.     Onychomycosis  Pain in right toes  Pain in left toes  Consent was obtained for treatment procedures.   Mechanical debridement of nails 1-5  bilaterally performed with a nail nipper.  Filed with dremel without incident.    Return office visit   3  months                   Told patient to return for periodic foot care and evaluation due to potential at risk complications.   Ranisha Allaire DPM   

## 2021-07-19 DIAGNOSIS — Z20822 Contact with and (suspected) exposure to covid-19: Secondary | ICD-10-CM | POA: Diagnosis not present

## 2021-08-03 ENCOUNTER — Other Ambulatory Visit: Payer: Self-pay | Admitting: Pulmonary Disease

## 2021-08-06 ENCOUNTER — Ambulatory Visit (INDEPENDENT_AMBULATORY_CARE_PROVIDER_SITE_OTHER): Payer: Medicare Other | Admitting: Pulmonary Disease

## 2021-08-06 ENCOUNTER — Encounter: Payer: Self-pay | Admitting: Pulmonary Disease

## 2021-08-06 VITALS — BP 120/60 | HR 83 | Temp 98.7°F | Ht 59.0 in | Wt 113.6 lb

## 2021-08-06 DIAGNOSIS — J449 Chronic obstructive pulmonary disease, unspecified: Secondary | ICD-10-CM

## 2021-08-06 NOTE — Progress Notes (Signed)
Heidi Moran    [id number]    April 04, 1935  Primary Care Physician:Aronson, Delfino Lovett, MD  Referring Physician: Burnard Bunting, MD [address] [place],  [place] [address]  Chief complaint:  Follow up for  Rheumatoid lung nodules Hypoxic respiratory failure on oxygen.  HPI: 86 year old with history of rheumatoid arthritis, diabetes, hyperlipidemia, hypertension.  She was evaluated in the ED recently fatigue, cough with a chest x-ray that showed lung nodules.  She had a follow-up CT scan and PET scan which demonstrated multiple pulmonary nodules, mild uptake in the dominant nodule.  [Reports below]. She has history of rheumatoid arthritis diagnosed about in 2018.  She follows with Dr. Trudie Reed, previously treated with Golinumab infusions which was held after she developed pneumonia.  Hospitalized in March 2019 for acute respiratory failure with bronchospasm and hypoxia, upper respiratory tract infection. Flu test was negative, CT chest showed no pulmonary embolism or acute lung abnormality Treated with steroids with improvement in symptoms and discharged on supplemental oxygen to SNF.  Hospitalized in November 2021 for dyspnea, acute on chronic respiratory failure, suspected COPD exacerbation Treated with prednisone and started on Anoro.  Received note from Dr. Trudie Reed, rheumatology dated 10/15/2017 Follow-up for rheumatoid arthritis with stable symptoms, no flareups Previously on Golimumab but currently not on therapy.  Pets: Cats.  No birds, farm animals Occupation: Housewife Exposures: None exposures Smoking history: Never smoker Travel History:Grew up in [place].  Moved to [place] 35 years ago.  Interim history: Continues on Anoro. Has good days bad days.  Overall stable since prior visit. Continues on supplemental oxygen.  Recently had a fall and right clavicle fracture which was treated conservatively.  Outpatient Encounter Medications as of 08/06/2021   Medication Sig   acetaminophen (TYLENOL) 325 MG tablet Take 2 tablets (650 mg total) by mouth every 6 (six) hours as needed for up to 30 doses for moderate pain or mild pain.   albuterol (VENTOLIN HFA) 108 (90 Base) MCG/ACT inhaler Inhale 2 puffs into the lungs every 6 (six) hours as needed for wheezing or shortness of breath.   atorvastatin (LIPITOR) 20 MG tablet Take 20 mg by mouth daily.   famotidine (PEPCID) 20 MG tablet TAKE 1 TABLET AT BEDTIME   glyBURIDE-metformin (GLUCOVANCE) 2.5-500 MG tablet Take 1 tablet by mouth daily with breakfast. Pt takes one in the morning with breakfast and one in the evening   ibuprofen (ADVIL) 600 MG tablet Take 1 tablet (600 mg total) by mouth every 8 (eight) hours as needed for up to 21 doses for mild pain or moderate pain. Take with food   lisinopril (ZESTRIL) 10 MG tablet Take 1 tablet (10 mg total) by mouth daily.   OXYGEN Inhale 2 L/day into the lungs daily.   umeclidinium-vilanterol (ANORO ELLIPTA) 62.5-25 MCG/ACT AEPB USE 1 INHALATION DAILY (WILL NEED OFFICE VISIT FOR FUTURE REFILLS)   [DISCONTINUED] fluticasone (FLONASE) 50 MCG/ACT nasal spray Place 1 spray into both nostrils daily.   No facility-administered encounter medications on file as of 08/06/2021.   Physical Exam: Blood pressure 120/60, pulse 83, temperature 98.7 F (37.1 C), temperature source Oral, height '4\' 11"'$  (1.499 m), weight 113 lb 9.6 oz (51.5 kg), SpO2 98 %. Gen:      No acute distress HEENT:  EOMI, sclera anicteric Neck:     No masses; no thyromegaly Lungs:    Clear to auscultation bilaterally; normal respiratory effort CV:         Regular rate and rhythm;  no murmurs Abd:      + bowel sounds; soft, non-tender; no palpable masses, no distension Ext:    No edema; adequate peripheral perfusion Skin:      Warm and dry; no rash Neuro: alert and oriented x 3 Psych: normal mood and affect   Data Reviewed: Imaging CT chest 02/12/17-borderline precarinal lymph nodes, multiple  pulmonary nodules bilaterally.  The largest one in the right upper lobe measures 13 x11 mm. PET scan 02/27/17-dominant nodule in the right middle lobe has mild-moderate FDG uptake.  No other areas of uptake. CTA 05/17/17-no pulmonary embolism, stable lung nodules. CT scan 06/11/17-stability of pulmonary nodules the largest is 1.3 cm in the right middle lobe. CT scan 12/12/2017- stable multiple bilateral nodules.  New compression deformity in L1. CT scan 12/16/2018- some of the pulmonary nodules for change in morphology to lobular with possible cavitation. CT scan 06/21/2019-stable pulmonary nodules.   CT chest 06/23/2020-stable pulmonary nodules I have reviewed the images personally.  PFTs  12/15/2017 FVC 0.95 (47%), FEV1 0.42 [28%), F/F 44, TLC 142%, DLCO 38% Severe obstruction, diffusion impairment  FENO 06/24/17-36  Labs CBC 06/24/2017-WBC 6.2, eos 1.5%, absolute eosinophil count 100 Blood allergy profile 06/24/2017-IgE to 32, sensitive to cat, Aspergillus, mouse urine Alpha-1 antitrypsin 12/15/2017-155, PI MM  Assessment:  Severe obstruction PFTs reviewed which shows severe obstruction which is surprising as she is a non-smoker. Alpha-1 antitrypsin levels are okay Continue Anoro, supplemental oxygen                                 Follow up for multiple pulmonary nodules Her images reviewed which shows stable rounded nodular opacities with mild PET activity.  Per report from primary care this has been present since at least 2017. This could be consistent with rheumatoid nodules which are are known to have low FDG uptake. The PET scan does not reveal any other abnormal areas of uptake.  She is a non-smoker with low risk of malignancy.    We had demonstrated stability for over 4 years and does not need additional follow-up.  GERD Continue Pepcid at night  Plan/Recommendations: - Continue Anoro, supplemental oxygen  Marshell Garfinkel MD [facility] 08/06/2021, 2:35  PM  CC: Burnard Bunting, MD

## 2021-08-06 NOTE — Patient Instructions (Signed)
I am glad you are stable with your breathing Continue the inhalers and supplemental oxygen Follow-up in 1 year.

## 2021-08-31 ENCOUNTER — Encounter: Payer: Self-pay | Admitting: *Deleted

## 2021-08-31 ENCOUNTER — Other Ambulatory Visit: Payer: Self-pay | Admitting: *Deleted

## 2021-08-31 NOTE — Patient Outreach (Signed)
[facility]) Care Management Geriatric Nurse Practitioner Note   08/31/2021 Name:  Heidi Moran MRN:  [id number] DOB:  1935/10/12  Summary: NEW [facility] CARE MANAGEMENT PATIENT - FOCUS ON COPD, DM AND SAFETY  Recommendations/Changes made from today's visit: 1)Consider OTC antihistamine prn for sxs cat allergy. 2)Limit cat exposure. )Watch weather report for AIR Quality report and stay indoors if ORANGE or RED. 4)Follow COPD Action Plan.  5)Consider moving bedroom to first floor or getting a lift chair to go up the stairs. 6)Check glucose if you are having sxs of hypoglycemia and treat yourself appropriately. 7)Avoid activities you feel may be unsafe.  Subjective: Heidi Moran is an 86 y.o. year old female who is a primary patient of Burnard Bunting, MD. The care management team was consulted for assistance with care management and/or care coordination needs.    Geriatric Nurse Practitioner completed Telephone Visit today.   Patient Active Problem List   Diagnosis Date Noted   Pain due to onychomycosis of toenails of both feet 10/04/2020   Obstructive airway disease ([place]) 02/04/2020   Abnormal findings on diagnostic imaging of lung 02/04/2020   Chronic respiratory failure with hypoxia ([place]) 01/15/2020   Controlled type 2 diabetes mellitus with hyperglycemia, without long-term current use of insulin ([place]) 01/15/2020   Mixed diabetic hyperlipidemia associated with type 2 diabetes mellitus ([place]) 01/15/2020   GERD without esophagitis 01/15/2020   Normocytic anemia 01/15/2020   COPD exacerbation ([place]) 01/15/2020   Essential hypertension 05/18/2017   Outpatient Encounter Medications as of 08/31/2021  Medication Sig   acetaminophen (TYLENOL) 325 MG tablet Take 2 tablets (650 mg total) by mouth every 6 (six) hours as needed for up to 30 doses for moderate pain or mild pain.   albuterol (VENTOLIN HFA) 108 (90 Base) MCG/ACT inhaler Inhale 2 puffs into the lungs every 6 (six)  hours as needed for wheezing or shortness of breath.   atorvastatin (LIPITOR) 20 MG tablet Take 20 mg by mouth daily.   famotidine (PEPCID) 20 MG tablet TAKE 1 TABLET AT BEDTIME   glyBURIDE-metformin (GLUCOVANCE) 2.5-500 MG tablet Take 1 tablet by mouth daily with breakfast. Pt takes one in the morning with breakfast and one in the evening   lisinopril (ZESTRIL) 10 MG tablet Take 1 tablet (10 mg total) by mouth daily.   OXYGEN Inhale 2 L/day into the lungs daily.   umeclidinium-vilanterol (ANORO ELLIPTA) 62.5-25 MCG/ACT AEPB USE 1 INHALATION DAILY (WILL NEED OFFICE VISIT FOR FUTURE REFILLS)   [DISCONTINUED] ibuprofen (ADVIL) 600 MG tablet Take 1 tablet (600 mg total) by mouth every 8 (eight) hours as needed for up to 21 doses for mild pain or moderate pain. Take with food   No facility-administered encounter medications on file as of 08/31/2021.   SDOH:  (Social Determinants of Health) assessments and interventions performed: Depression, monitor and counsel. SDOH Interventions    Flowsheet Row Most Recent Value  SDOH Interventions   Food Insecurity Interventions Intervention Not Indicated  Financial Strain Interventions Intervention Not Indicated  Housing Interventions Intervention Not Indicated  Physical Activity Interventions Intervention Not Indicated  [Pt has severe COPD and cannot do any exercise except walk around the house.]  Stress Interventions Intervention Not Indicated  Social Connections Interventions Intervention Not Indicated  Transportation Interventions Intervention Not Indicated  Depression Interventions/Treatment  Counseling       Care Plan  Review of patient past medical history, allergies, medications, health status, including review of consultants reports, laboratory and other test data, was  performed as part of comprehensive evaluation for care management services.   Care Plan : [facility] NP Plan of Care  Updates made by Deloria Lair, NP since 08/31/2021 12:00 AM      Problem: COPD/RESIRATORY FAILURE   Priority: High  Onset Date: 08/31/2021     Long-Range Goal: PATIENT WILL FOLLOW THE COPD ACTION PLAN TO PREVENT  EXACERBATIONS AND HOSPITALIZATION OVER THE NEXT 90 DAYS.   Start Date: 08/31/2021  Expected End Date: 12/14/2021  Priority: High  Note:   Current Barriers:  Chronic Disease Management support and education needs related to COPD, DMII, and SAFETY No Advanced Directives in place  RNCM Clinical Goal(s):  Patient will FOLLOW COPD ACTION PLAN AND COMPLETE ADVANCED DIRECTIVES through collaboration with RN Care manager, provider, and care team.   Interventions: Inter-disciplinary care team collaboration (see longitudinal plan of care) Evaluation of current treatment plan related to  self management and patient's adherence to plan as established by provider  Patient Goals/Self-Care Activities: Take medications as prescribed   Attend all scheduled provider appointments Call pharmacy for medication refills 3-7 days in advance of running out of medications Attend church or other social activities Perform all self care activities independently  Call provider office for new concerns or questions       Problem: IS NOT CHECKING HER GLUCOSE LEVELS AT THE TIME OF POSSIBLE HYPOGLYCEMIC EPISODES   Priority: Medium  Onset Date: 08/31/2021     Long-Range Goal: PATIENT WILL CHECK HER GLUCOSE IF SHE HAS SXS OF HYPOGLYCEMIA AND RECORD. IF SUGAR IS <70 SHE WILL TREAT HERSELF WITH 15 GM SNACK, GLUCOSE TABS OR GEL, PER PT REPORT OVER THE NEXT 90 DAYS.   Start Date: 08/31/2021  Expected End Date: 12/14/2021  Priority: Medium  Note:   Current Barriers:  Knowledge Deficits related to plan of care for management of DMII   RNCM Clinical Goal(s):  Patient will verbalize understanding of plan for management of DMII as evidenced by [place]. demonstrate improved adherence to prescribed treatment plan for  DMII as evidenced by PT REPORTS OF CHECKING GLUCOSE AND PROVIDING READINGS AND TREATMENT  through collaboration with RN Care manager, provider, and care team.   Interventions: Inter-disciplinary care team collaboration (see longitudinal plan of care) Evaluation of current treatment plan related to  self management and patient's adherence to plan as established by provider  Patient Goals/Self-Care Activities: Take medications as prescribed   Attend all scheduled provider appointments Call pharmacy for medication refills 3-7 days in advance of running out of medications Perform all self care activities independently  Call provider office for new concerns or questions          Problem: HIGH RISK FOR FALLS AND HOUSEHOLD ACCIDENTS   Priority: Low  Onset Date: 08/31/2021     Goal: PATIENT WILL FOLLOW FALL PRECAUTIONS AND BE MINDFUL WHEN DOING HOUSEHOLD TASKS TO ALWAYS THINK ABOUT WHAT YOU ARE GOING TO DO AND POSSIBLY ASK FOR HELP OVER THE NEXT 30 DAYS.   Start Date: 08/31/2021  Expected End Date: 12/14/2021  Priority: Low  Note:   Current Barriers:  Has 3 cats which get underfoot. Learning how to think before doing some household chores.  RNCM Clinical Goal(s):  Patient will demonstrate ongoing self health care management ability by preventing accidents as evidenced by    pt report and chart review. through collaboration with RN Care manager, provider, and care team.   Interventions: Inter-disciplinary care team collaboration (see longitudinal plan of care)  Evaluation of current treatment plan related to  self management and patient's adherence to plan as established by provider  Patient Goals/Self-Care Activities: Perform all self care activities independently  Ask for help from her husband if she knows she should not try to due a task because it may be risky.        Plan: Telephone follow up appointment with care management team member scheduled for:  Pt agrees to the Plan of  Care.  Eulah Pont. Myrtie Neither, MSN, [facility] Gerontological Nurse Practitioner [facility] Care Management [phone]

## 2021-09-07 ENCOUNTER — Other Ambulatory Visit: Payer: Self-pay | Admitting: *Deleted

## 2021-09-13 ENCOUNTER — Other Ambulatory Visit: Payer: Self-pay | Admitting: Pulmonary Disease

## 2021-09-14 ENCOUNTER — Other Ambulatory Visit: Payer: Self-pay

## 2021-09-14 ENCOUNTER — Encounter (HOSPITAL_BASED_OUTPATIENT_CLINIC_OR_DEPARTMENT_OTHER): Payer: Self-pay

## 2021-09-14 ENCOUNTER — Emergency Department (HOSPITAL_BASED_OUTPATIENT_CLINIC_OR_DEPARTMENT_OTHER): Payer: Medicare Other | Admitting: Radiology

## 2021-09-14 ENCOUNTER — Emergency Department (HOSPITAL_BASED_OUTPATIENT_CLINIC_OR_DEPARTMENT_OTHER)
Admission: EM | Admit: 2021-09-14 | Discharge: 2021-09-14 | Disposition: A | Discharge status: Home / Self Care | Payer: Medicare Other | Attending: Emergency Medicine | Admitting: Emergency Medicine

## 2021-09-14 DIAGNOSIS — S335XXA Sprain of ligaments of lumbar spine, initial encounter: Secondary | ICD-10-CM | POA: Diagnosis not present

## 2021-09-14 DIAGNOSIS — S338XXA Sprain of other parts of lumbar spine and pelvis, initial encounter: Secondary | ICD-10-CM

## 2021-09-14 DIAGNOSIS — W07XXXA Fall from chair, initial encounter: Secondary | ICD-10-CM | POA: Diagnosis not present

## 2021-09-14 DIAGNOSIS — M25551 Pain in right hip: Secondary | ICD-10-CM | POA: Insufficient documentation

## 2021-09-14 DIAGNOSIS — S3992XA Unspecified injury of lower back, initial encounter: Secondary | ICD-10-CM | POA: Diagnosis present

## 2021-09-14 DIAGNOSIS — Z9981 Dependence on supplemental oxygen: Secondary | ICD-10-CM | POA: Insufficient documentation

## 2021-09-14 DIAGNOSIS — M533 Sacrococcygeal disorders, not elsewhere classified: Secondary | ICD-10-CM | POA: Diagnosis not present

## 2021-09-14 MED ORDER — AEROCHAMBER PLUS FLO-VU MISC
1.0000 | Freq: Once | Status: AC
  Administered 2021-09-14: 1
  Filled 2021-09-14: qty 1

## 2021-09-14 MED ORDER — ACETAMINOPHEN 325 MG PO TABS
650.0000 mg | ORAL_TABLET | Freq: Once | ORAL | Status: AC
  Administered 2021-09-14: 650 mg via ORAL
  Filled 2021-09-14: qty 2

## 2021-09-14 NOTE — Discharge Instructions (Signed)
Follow-up with orthopedics regarding the possible coccyx fracture.  Take Tylenol as needed for your pain.  Come back to ER as needed if you are having uncontrolled pain, inability to walk or other new concerning symptom.

## 2021-09-14 NOTE — ED Notes (Signed)
RT educated pt and spouse on proper use of MDI w/spacer. Pt verbalizes understanding of education at this time.

## 2021-09-14 NOTE — ED Notes (Signed)
Pt and spouse agreeable with d/c plan as discussed by provider- this nurse has verbally reinforced d/c instructions and provided pt with with written copy - no additional questions, concerns, needs verbalized - vitals stable; no distress at d/c

## 2021-09-14 NOTE — ED Notes (Signed)
O2 tank reassessed. Calculated O2 supply at this time 4.5 hours on 2 L. Patient is in no distress.

## 2021-09-14 NOTE — ED Triage Notes (Signed)
Onset about 1600 hours fell when getting up from chair.  States got twisted with home 02 tubing and fell onto floor and stairs.  C/O pain to lower back.  Denies LOC.  Denies being on blood thinners.  States did not hit head.

## 2021-09-14 NOTE — ED Notes (Signed)
Pt awake and alert lying in bed with visitor at bedside.  RR even and unlabored on 2L O2 via [place] with symmetrical rise and fall of chest (pt O2 dependent at home).  Continuous cardiac and pulse ox monitoring maintained.  Pt c/o "tailbone" pain - rated 4-5/10; denies any other complaints.  Will monitor for acute changes and maintain plan of care.

## 2021-09-14 NOTE — ED Notes (Signed)
Patient is O2 dependent at home. Patient was placed on 2 L on department O2 tank. O2 supply calculated for 4 hours at this time on 2 L. RT will continue to monitor O2 tank while patient is waiting for ED room.

## 2021-09-15 NOTE — ED Provider Notes (Signed)
[facility] EMERGENCY DEPT Provider Note   CSN: [id number] Arrival date & time: 09/14/21  1628     History  Chief Complaint  Patient presents with   Heidi Moran is a 86 y.o. female.    86 year old lady presenting to ER due to concern for fall, tailbone pain.  Patient reports that she fell getting up from a chair and landed on her bottom.  Per triage note had complaint of lower back pain but on further clarification with patient, she says that the pain is primarily in her tailbone.  Pain is worse with movement and improved with rest.  Currently mild to moderate.  Aching sensation.  She denies hitting her head and denies passing out.  At rest overall feels fine.  Does report that she wears oxygen at home.  Does not report any sort of increased work of breathing or difficulty in breathing or change in her overall status at this time.       Home Medications Prior to Admission medications   Medication Sig Start Date End Date Taking? Authorizing Provider  acetaminophen (TYLENOL) 325 MG tablet Take 2 tablets (650 mg total) by mouth every 6 (six) hours as needed for up to 30 doses for moderate pain or mild pain. 12/16/20   Wyvonnia Dusky, MD  albuterol (VENTOLIN HFA) 108 (90 Base) MCG/ACT inhaler Inhale 2 puffs into the lungs every 6 (six) hours as needed for wheezing or shortness of breath. 03/08/20   Mannam, Hart Robinsons, MD  atorvastatin (LIPITOR) 20 MG tablet Take 20 mg by mouth daily.    [provider]  famotidine (PEPCID) 20 MG tablet TAKE 1 TABLET AT BEDTIME (NEED APPOINTMENT FOR FURTHER REFILLS) 09/13/21   Mannam, Hart Robinsons, MD  glyBURIDE-metformin (GLUCOVANCE) 2.5-500 MG tablet Take 1 tablet by mouth daily with breakfast. Pt takes one in the morning with breakfast and one in the evening    [provider]  lisinopril (ZESTRIL) 10 MG tablet Take 1 tablet (10 mg total) by mouth daily. 01/21/20   Mercy Riding, MD  OXYGEN Inhale 2 L/day into the lungs  daily.    [provider]  umeclidinium-vilanterol (ANORO ELLIPTA) 62.5-25 MCG/ACT AEPB USE 1 INHALATION DAILY (WILL NEED OFFICE VISIT FOR FUTURE REFILLS) 08/03/21   Marshell Garfinkel, MD      Allergies    Patient has no known allergies.    Review of Systems   Review of Systems  Musculoskeletal:  Positive for arthralgias.  All other systems reviewed and are negative.   Physical Exam Updated Vital Signs BP (!) 134/49   Pulse 65   Temp 98.5 F (36.9 C) (Oral)   Resp 20   Ht '4\' 11"'$  (1.499 m)   Wt 49.9 kg   SpO2 100%   BMI 22.22 kg/m  Physical Exam Vitals and nursing note reviewed.  Constitutional:      General: She is not in acute distress.    Appearance: She is well-developed.  HENT:     Head: Normocephalic and atraumatic.  Eyes:     Conjunctiva/sclera: Conjunctivae normal.  Cardiovascular:     Rate and Rhythm: Normal rate and regular rhythm.     Heart sounds: No murmur heard. Pulmonary:     Effort: Pulmonary effort is normal. No respiratory distress.     Breath sounds: Normal breath sounds.  Abdominal:     Palpations: Abdomen is soft.     Tenderness: There is no abdominal tenderness.  Musculoskeletal:  General: No swelling.     Cervical back: Neck supple.     Comments: Back: no C, T, L spine TTP, no step off or deformity Some tenderness to palpation over coccyx region RUE: no TTP throughout, no deformity, normal joint ROM, radial pulse intact, distal sensation and motor intact LUE: no TTP throughout, no deformity, normal joint ROM, radial pulse intact, distal sensation and motor intact RLE: Mild tenderness in hip, no deformity, normal joint ROM, distal pulse, sensation and motor intact LLE: no TTP throughout, no deformity, normal joint ROM, distal pulse, sensation and motor intact  Skin:    General: Skin is warm and dry.     Capillary Refill: Capillary refill takes less than 2 seconds.  Neurological:     Mental Status: She is alert.  Psychiatric:         Mood and Affect: Mood normal.     ED Results / Procedures / Treatments   Labs (all labs ordered are listed, but only abnormal results are displayed) Labs Reviewed - No data to display  EKG None  Radiology DG Hip Unilat W or Wo Pelvis 2-3 Views Right  Result Date: 09/14/2021 CLINICAL DATA:  Tailbone and right hip pain.  Fall. EXAM: DG HIP (WITH OR WITHOUT PELVIS) 2-3V RIGHT COMPARISON:  None Available. FINDINGS: Cortical margins of the pelvis and right hip are intact. No evidence of acute fracture. The pubic rami are intact. The questioned coccygeal fracture on dedicated coccyx exam is not seen. The bones are subjectively under mineralized. Hip joint spaces are preserved. IMPRESSION: No fracture of the right hip or hemipelvis. Electronically Signed   By: Keith Rake M.D.   On: 09/14/2021 21:23   DG Sacrum/Coccyx  Result Date: 09/14/2021 CLINICAL DATA:  86 year old with tailbone in right hip pain.  Fall. EXAM: SACRUM AND COCCYX - 2+ VIEW COMPARISON:  None Available. FINDINGS: Majority of the sacrum is obscured by overlying stool and bowel gas on the frontal views. There is a cortical step-off involving the upper coccyx that may represent fracture, this is only appreciated on the lateral view. The bones are subjectively under mineralized. IMPRESSION: Cortical step-off involving the upper coccyx may represent fracture, this is only appreciated on the lateral view. Assessment on the frontal views is essentially obscured by overlying stool and bowel gas. Electronically Signed   By: Keith Rake M.D.   On: 09/14/2021 21:21    Procedures Procedures    Medications Ordered in ED Medications  aerochamber plus with mask device 1 each (1 each Other Given 09/14/21 2141)  acetaminophen (TYLENOL) tablet 650 mg (650 mg Oral Given 09/14/21 2119)    ED Course/ Medical Decision Making/ A&P                           Medical Decision Making 86 year old who presented to the emergency  department due to concern for tailbone pain.  Reports mechanical fall.  Denies any other trauma specifically denies any head and denies loss of consciousness.  She is very well-appearing in no distress, vital signs are stable on home oxygen.  On physical exam I did appreciate some tenderness over her coccyx region and there was some mild tenderness in her right hip.  Plain films obtained.  I independently reviewed and interpreted imaging.  Reviewed radiology reports.  Concern for possible fracture and coccyx but only seen in one view.  Given patient does have some tenderness at the site suspect she may have a  mild nondisplaced coccyx fracture or has a sprain/strain.  Her hip x-ray is negative.  She reports going to Dr. Tamera Punt, local orthopedist for unrelated issue previously.  I advised following up with his office for further management.  Recommend bearing weight as tolerated.  Pain is well controlled and she remains well-appearing on final reassessment.  Discharged home with her husband.  Amount and/or Complexity of Data Reviewed Independent Historian: spouse Radiology: ordered and independent interpretation performed.  Risk OTC drugs. Prescription drug management.           Final Clinical Impression(s) / ED Diagnoses Final diagnoses:  Coccyx sprain, initial encounter    Rx / DC Orders ED Discharge Orders     None         Lucrezia Starch, MD 09/15/21 2014

## 2021-09-24 DIAGNOSIS — S322XXA Fracture of coccyx, initial encounter for closed fracture: Secondary | ICD-10-CM | POA: Diagnosis not present

## 2021-09-25 DIAGNOSIS — H04123 Dry eye syndrome of bilateral lacrimal glands: Secondary | ICD-10-CM | POA: Diagnosis not present

## 2021-09-25 DIAGNOSIS — H1789 Other corneal scars and opacities: Secondary | ICD-10-CM | POA: Diagnosis not present

## 2021-09-25 DIAGNOSIS — H1820 Unspecified corneal edema: Secondary | ICD-10-CM | POA: Diagnosis not present

## 2021-09-25 DIAGNOSIS — H02054 Trichiasis without entropian left upper eyelid: Secondary | ICD-10-CM | POA: Diagnosis not present

## 2021-10-08 DIAGNOSIS — I129 Hypertensive chronic kidney disease with stage 1 through stage 4 chronic kidney disease, or unspecified chronic kidney disease: Secondary | ICD-10-CM | POA: Diagnosis not present

## 2021-10-08 DIAGNOSIS — N182 Chronic kidney disease, stage 2 (mild): Secondary | ICD-10-CM | POA: Diagnosis not present

## 2021-10-08 DIAGNOSIS — D638 Anemia in other chronic diseases classified elsewhere: Secondary | ICD-10-CM | POA: Diagnosis not present

## 2021-10-08 DIAGNOSIS — E785 Hyperlipidemia, unspecified: Secondary | ICD-10-CM | POA: Diagnosis not present

## 2021-10-08 DIAGNOSIS — Z9181 History of falling: Secondary | ICD-10-CM | POA: Diagnosis not present

## 2021-10-08 DIAGNOSIS — E1129 Type 2 diabetes mellitus with other diabetic kidney complication: Secondary | ICD-10-CM | POA: Diagnosis not present

## 2021-10-16 ENCOUNTER — Other Ambulatory Visit: Payer: Self-pay | Admitting: *Deleted

## 2021-10-16 NOTE — Patient Outreach (Deleted)
[facility]) Care Management  10/16/2021  Heidi Moran 03/14/1936 [id number]  Telephone outreach, left a message and and requested a return call.  Eulah Pont. Myrtie Neither, MSN, [facility] Gerontological Nurse Practitioner [facility] Care Management [phone]

## 2021-10-16 NOTE — Patient Outreach (Signed)
  Care Coordination   Follow Up Visit Note   10/16/2021 Name: Heidi Moran MRN: [id number] DOB: [birth year]  Heidi Moran is a 86 y.o. year old female who sees Burnard Bunting, MD for primary care. I spoke with  Heidi Moran by phone today  What matters to the patients health and wellness today?  Prevent falling.   Goals Addressed               This Visit's Progress     Patient Stated     I will do what I can to prevent falls. (pt-stated)          SDOH assessments and interventions completed:   Yes previously addressed.   Care Coordination Interventions Activated:  Yes  Care Coordination Interventions:  Yes, provided Reinforced fall precautions!  Follow up plan: Follow up call scheduled for 1 month.  Encounter Outcome:  Pt. Visit Completed  Heidi Memos C. Myrtie Neither, MSN, [facility] Gerontological Nurse Practitioner [facility] Care Management [phone]

## 2021-10-22 ENCOUNTER — Encounter: Payer: Self-pay | Admitting: Podiatry

## 2021-10-22 ENCOUNTER — Ambulatory Visit (INDEPENDENT_AMBULATORY_CARE_PROVIDER_SITE_OTHER): Payer: Medicare Other | Admitting: Podiatry

## 2021-10-22 DIAGNOSIS — E1165 Type 2 diabetes mellitus with hyperglycemia: Secondary | ICD-10-CM

## 2021-10-22 DIAGNOSIS — B351 Tinea unguium: Secondary | ICD-10-CM | POA: Diagnosis not present

## 2021-10-22 DIAGNOSIS — M79675 Pain in left toe(s): Secondary | ICD-10-CM

## 2021-10-22 DIAGNOSIS — M79674 Pain in right toe(s): Secondary | ICD-10-CM | POA: Diagnosis not present

## 2021-10-22 NOTE — Progress Notes (Signed)
This patient returns to my office for at risk foot care.  This patient requires this care by a professional since this patient will be at risk due to having diabetes diet controlled  This patient is unable to cut nails herself since the patient cannot reach her nails.These nails are painful walking and wearing shoes.  This patient presents for at risk foot care today.  General Appearance  Alert, conversant and in no acute stress.  Vascular  Dorsalis pedis and posterior tibial  pulses are absent  bilaterally.  Capillary return is within normal limits  bilaterally. Temperature is within normal limits  bilaterally. Venous pathology ankles  B/L.  Neurologic  Senn-Weinstein monofilament wire test within normal limits .  LOPS diminished left. Muscle power within normal limits bilaterally.  Nails Thick disfigured discolored nails with subungual debris  from hallux to fifth toes bilaterally. No evidence of bacterial infection or drainage bilaterally.  Orthopedic  No limitations of motion  feet .  No crepitus or effusions noted.  No bony pathology or digital deformities noted.  Skin  normotropic skin with no porokeratosis noted bilaterally.  No signs of infections or ulcers noted.     Onychomycosis  Pain in right toes  Pain in left toes  Consent was obtained for treatment procedures.   Mechanical debridement of nails 1-5  bilaterally performed with a nail nipper.  Filed with dremel without incident.    Return office visit   3  months                   Told patient to return for periodic foot care and evaluation due to potential at risk complications.   Sybol Morre DPM   

## 2021-11-16 ENCOUNTER — Ambulatory Visit: Payer: Self-pay | Admitting: *Deleted

## 2021-11-18 ENCOUNTER — Encounter (HOSPITAL_BASED_OUTPATIENT_CLINIC_OR_DEPARTMENT_OTHER): Payer: Self-pay | Admitting: *Deleted

## 2021-11-18 ENCOUNTER — Emergency Department (HOSPITAL_BASED_OUTPATIENT_CLINIC_OR_DEPARTMENT_OTHER): Payer: Medicare Other

## 2021-11-18 ENCOUNTER — Emergency Department (HOSPITAL_BASED_OUTPATIENT_CLINIC_OR_DEPARTMENT_OTHER)
Admission: EM | Admit: 2021-11-18 | Discharge: 2021-11-18 | Disposition: A | Discharge status: Home / Self Care | Payer: Medicare Other | Attending: Emergency Medicine | Admitting: Emergency Medicine

## 2021-11-18 ENCOUNTER — Other Ambulatory Visit: Payer: Self-pay

## 2021-11-18 DIAGNOSIS — R198 Other specified symptoms and signs involving the digestive system and abdomen: Secondary | ICD-10-CM | POA: Insufficient documentation

## 2021-11-18 DIAGNOSIS — R197 Diarrhea, unspecified: Secondary | ICD-10-CM | POA: Diagnosis not present

## 2021-11-18 DIAGNOSIS — R110 Nausea: Secondary | ICD-10-CM | POA: Diagnosis not present

## 2021-11-18 DIAGNOSIS — J449 Chronic obstructive pulmonary disease, unspecified: Secondary | ICD-10-CM | POA: Insufficient documentation

## 2021-11-18 DIAGNOSIS — K8020 Calculus of gallbladder without cholecystitis without obstruction: Secondary | ICD-10-CM | POA: Insufficient documentation

## 2021-11-18 DIAGNOSIS — E119 Type 2 diabetes mellitus without complications: Secondary | ICD-10-CM | POA: Diagnosis not present

## 2021-11-18 DIAGNOSIS — N2889 Other specified disorders of kidney and ureter: Secondary | ICD-10-CM | POA: Diagnosis not present

## 2021-11-18 LAB — URINALYSIS, ROUTINE W REFLEX MICROSCOPIC
Bilirubin Urine: NEGATIVE
Glucose, UA: NEGATIVE mg/dL
Hgb urine dipstick: NEGATIVE
Ketones, ur: NEGATIVE mg/dL
Nitrite: NEGATIVE
Protein, ur: NEGATIVE mg/dL
Specific Gravity, Urine: 1.01 (ref 1.005–1.030)
pH: 7.5 (ref 5.0–8.0)

## 2021-11-18 LAB — COMPREHENSIVE METABOLIC PANEL
ALT: 13 U/L (ref 0–44)
AST: 20 U/L (ref 15–41)
Albumin: 4.3 g/dL (ref 3.5–5.0)
Alkaline Phosphatase: 57 U/L (ref 38–126)
Anion gap: 3 — ABNORMAL LOW (ref 5–15)
BUN: 26 mg/dL — ABNORMAL HIGH (ref 8–23)
CO2: 36 mmol/L — ABNORMAL HIGH (ref 22–32)
Calcium: 9.5 mg/dL (ref 8.9–10.3)
Chloride: 97 mmol/L — ABNORMAL LOW (ref 98–111)
Creatinine, Ser: 0.88 mg/dL (ref 0.44–1.00)
GFR, Estimated: >60 mL/min (ref >60)
Glucose, Bld: 129 mg/dL — ABNORMAL HIGH (ref 70–99)
Potassium: 5.6 mmol/L — ABNORMAL HIGH (ref 3.5–5.1)
Sodium: 136 mmol/L (ref 135–145)
Total Bilirubin: 0.3 mg/dL (ref 0.3–1.2)
Total Protein: 7 g/dL (ref 6.5–8.1)

## 2021-11-18 LAB — LIPASE, BLOOD: Lipase: 61 U/L — ABNORMAL HIGH (ref 11–51)

## 2021-11-18 LAB — CBC
HCT: 30.2 % — ABNORMAL LOW (ref 36.0–46.0)
Hemoglobin: 9.4 g/dL — ABNORMAL LOW (ref 12.0–15.0)
MCH: 30 pg (ref 26.0–34.0)
MCHC: 31.1 g/dL (ref 30.0–36.0)
MCV: 96.5 fL (ref 80.0–100.0)
Platelets: 253 10*3/uL (ref 150–400)
RBC: 3.13 MIL/uL — ABNORMAL LOW (ref 3.87–5.11)
RDW: 12.6 % (ref 11.5–15.5)
WBC: 6.6 10*3/uL (ref 4.0–10.5)
nRBC: 0 % (ref 0.0–0.2)

## 2021-11-18 MED ORDER — IOHEXOL 300 MG/ML  SOLN
100.0000 mL | Freq: Once | INTRAMUSCULAR | Status: AC | PRN
  Administered 2021-11-18: 80 mL via INTRAVENOUS

## 2021-11-18 MED ORDER — LOPERAMIDE HCL 2 MG PO CAPS: 2.0000 mg | ORAL_CAPSULE | Freq: Four times a day (QID) | ORAL | 0 refills | Status: DC | PRN

## 2021-11-18 MED ORDER — SODIUM CHLORIDE 0.9 % IV BOLUS
1000.0000 mL | Freq: Once | INTRAVENOUS | Status: AC
  Administered 2021-11-18: 1000 mL via INTRAVENOUS

## 2021-11-18 NOTE — ED Provider Notes (Signed)
[facility] EMERGENCY DEPT Provider Note   CSN: [id number] Arrival date & time: 11/18/21  1557     History {Add pertinent medical, surgical, social history, OB history to HPI:1} Chief Complaint  Patient presents with   Diarrhea    Heidi Moran is a 87 y.o. female.  She has a history of rheumatoid arthritis, COPD on oxygen, diabetes.  She has had poor p.o. intake for the last few days.  Today he experienced multiple episodes of loose stool.  She denies any abdominal pain but feels distended and gassy and sore.  There was no blood in the diarrhea and no nausea or vomiting.  No sick contacts or recent travel.  No fevers or chills.  She said she feels dehydrated.  The history is provided by the patient and the spouse.  Diarrhea Quality:  Watery Severity:  Moderate Number of episodes:  5 Duration:  1 day Timing:  Intermittent Progression:  Unchanged Relieved by:  None tried Worsened by:  Nothing Ineffective treatments:  None tried Associated symptoms: abdominal pain   Associated symptoms: no chills, no fever and no vomiting   Risk factors: no recent antibiotic use, no sick contacts and no suspicious food intake        Home Medications Prior to Admission medications   Medication Sig Start Date End Date Taking? Authorizing Provider  acetaminophen (TYLENOL) 325 MG tablet Take 2 tablets (650 mg total) by mouth every 6 (six) hours as needed for up to 30 doses for moderate pain or mild pain. 12/16/20   Wyvonnia Dusky, MD  albuterol (VENTOLIN HFA) 108 (90 Base) MCG/ACT inhaler Inhale 2 puffs into the lungs every 6 (six) hours as needed for wheezing or shortness of breath. 03/08/20   Mannam, Hart Robinsons, MD  atorvastatin (LIPITOR) 20 MG tablet TAKE 1 TABLET orally once a day for 90 days    [provider]  Cholecalciferol (VITAMIN D3) 25 MCG (1000 UT) CAPS 1 capsule Orally Once a day    [provider]  famotidine (PEPCID) 20 MG tablet TAKE 1 TABLET AT  BEDTIME (NEED APPOINTMENT FOR FURTHER REFILLS) 09/13/21   Mannam, Hart Robinsons, MD  glyBURIDE-metformin (GLUCOVANCE) 2.5-500 MG tablet Take 1 tablet by mouth daily with breakfast. Pt takes one in the morning with breakfast and one in the evening    [provider]  ibuprofen (ADVIL) 600 MG tablet 1 tablet with food or milk as needed Orally Three times a day    [provider]  lisinopril (ZESTRIL) 10 MG tablet TAKE 1 TABLET orally once a day for 90 days    [provider]  OXYGEN Inhale 2 L/day into the lungs daily.    [provider]  umeclidinium-vilanterol (ANORO ELLIPTA) 62.5-25 MCG/ACT AEPB USE 1 INHALATION DAILY (WILL NEED OFFICE VISIT FOR FUTURE REFILLS) 08/03/21   Marshell Garfinkel, MD      Allergies    Patient has no known allergies.    Review of Systems   Review of Systems  Constitutional:  Negative for chills and fever.  Respiratory:  Positive for shortness of breath (baseline).   Gastrointestinal:  Positive for abdominal pain and diarrhea. Negative for vomiting.  Genitourinary:  Negative for dysuria.    Physical Exam Updated Vital Signs BP (!) 140/64 (BP Location: Right Arm)   Pulse 71   Temp 98.1 F (36.7 C) (Oral)   Resp 16   SpO2 97%  Physical Exam Vitals and nursing note reviewed.  Constitutional:      General:  She is not in acute distress.    Appearance: Normal appearance. She is well-developed.  HENT:     Head: Normocephalic and atraumatic.  Eyes:     Conjunctiva/sclera: Conjunctivae normal.  Cardiovascular:     Rate and Rhythm: Normal rate and regular rhythm.     Heart sounds: No murmur heard. Pulmonary:     Effort: Pulmonary effort is normal. No respiratory distress.     Breath sounds: Normal breath sounds.  Abdominal:     General: There is distension.     Palpations: Abdomen is soft. There is no mass.     Tenderness: There is no abdominal tenderness. There is no guarding or rebound.  Musculoskeletal:     Cervical back:  Neck supple.     Right lower leg: No edema.     Left lower leg: No edema.  Skin:    General: Skin is warm and dry.     Capillary Refill: Capillary refill takes less than 2 seconds.  Neurological:     General: No focal deficit present.     Mental Status: She is alert.     ED Results / Procedures / Treatments   Labs (all labs ordered are listed, but only abnormal results are displayed) Labs Reviewed  LIPASE, BLOOD - Abnormal; Notable for the following components:      Result Value   Lipase 61 (*)    All other components within normal limits  COMPREHENSIVE METABOLIC PANEL - Abnormal; Notable for the following components:   Potassium 5.6 (*)    Chloride 97 (*)    CO2 36 (*)    Glucose, Bld 129 (*)    BUN 26 (*)    Anion gap 3 (*)    All other components within normal limits  CBC - Abnormal; Notable for the following components:   RBC 3.13 (*)    Hemoglobin 9.4 (*)    HCT 30.2 (*)    All other components within normal limits  URINALYSIS, ROUTINE W REFLEX MICROSCOPIC    EKG None  Radiology No results found.  Procedures Procedures  {Document cardiac monitor, telemetry assessment procedure when appropriate:1}  Medications Ordered in ED Medications  sodium chloride 0.9 % bolus 1,000 mL (has no administration in time range)    ED Course/ Medical Decision Making/ A&P                           Medical Decision Making Amount and/or Complexity of Data Reviewed Labs: ordered. Radiology: ordered.   This patient complains of ***; this involves an extensive number of treatment Options and is a complaint that carries with it a high risk of complications and morbidity. The differential includes ***  I ordered, reviewed and interpreted labs, which included *** I ordered medication *** and reviewed PMP when indicated. I ordered imaging studies which included *** and I independently    visualized and interpreted imaging which showed *** Additional history obtained from  *** Previous records obtained and reviewed *** I consulted *** and discussed lab and imaging findings and discussed disposition.  Cardiac monitoring reviewed, *** Social determinants considered, *** Critical Interventions: ***  After the interventions stated above, I reevaluated the patient and found *** Admission and further testing considered, ***   {Document critical care time when appropriate:1} {Document review of labs and clinical decision tools ie heart score, Chads2Vasc2 etc:1}  {Document your independent review of radiology images, and any outside records:1} {Document your discussion with  family members, caretakers, and with consultants:1} {Document social determinants of health affecting pt's care:1} {Document your decision making why or why not admission, treatments were needed:1} Final Clinical Impression(s) / ED Diagnoses Final diagnoses:  None    Rx / DC Orders ED Discharge Orders     None

## 2021-11-18 NOTE — Discharge Instructions (Addendum)
You were seen in the emergency department for diarrhea and abdominal distention.  Lab work was fairly unremarkable.  Your CAT scan did not show an obvious explanation for your diarrhea.  He did have signs of a kidney mass that will need follow-up imaging.  We are putting you on some medication to help with your diarrhea.  Please start with a clear liquid diet advance as tolerated.  Follow-up with your regular doctor.  Return to the emergency department if any worsening or concerning symptoms.

## 2021-11-18 NOTE — ED Notes (Signed)
Pt dressed and ready for d/c home- awaiting husband who has left to retrieve pt home O2 tank

## 2021-11-18 NOTE — ED Notes (Signed)
BSC placed in room.  Pt advised that urine sample is needed.  Pt has not had any further BM since arrival and has not needed to void

## 2021-11-18 NOTE — ED Notes (Signed)
Pt spouse has now returned -- pt agreeable with d/c plan as discussed by provider- this nurse has verbally reinforced d/c instructions and provided pt with written copy - pt acknowledges verbal understanding and denies any additional questions, concerns needs- escorted to vehicle via w/c by ED tech and to be driven/accompanied home with spouse

## 2021-11-18 NOTE — ED Triage Notes (Signed)
Pt is here for diarrhea today, pt reports 4 episodes of diarrhea and a "gassy feeling" in her stomach.  Pt denies any abdominal pain or GU symptoms.  Pt is on 2L Home o2 and arrives by EMS.

## 2021-11-19 ENCOUNTER — Telehealth (HOSPITAL_BASED_OUTPATIENT_CLINIC_OR_DEPARTMENT_OTHER): Payer: Self-pay | Admitting: Emergency Medicine

## 2021-11-19 MED ORDER — LOPERAMIDE HCL 2 MG PO CAPS: 2.0000 mg | ORAL_CAPSULE | Freq: Four times a day (QID) | ORAL | 0 refills | Status: DC | PRN

## 2021-11-19 NOTE — ED Notes (Signed)
Spouse returned to ED to request medications be switched to a different pharmacy as the one med were sent to was closed. Switched completed per spouse request.

## 2021-11-28 DIAGNOSIS — R918 Other nonspecific abnormal finding of lung field: Secondary | ICD-10-CM | POA: Diagnosis not present

## 2021-11-28 DIAGNOSIS — E1129 Type 2 diabetes mellitus with other diabetic kidney complication: Secondary | ICD-10-CM | POA: Diagnosis not present

## 2021-11-28 DIAGNOSIS — M419 Scoliosis, unspecified: Secondary | ICD-10-CM | POA: Diagnosis not present

## 2021-11-28 DIAGNOSIS — D638 Anemia in other chronic diseases classified elsewhere: Secondary | ICD-10-CM | POA: Diagnosis not present

## 2021-11-28 DIAGNOSIS — R197 Diarrhea, unspecified: Secondary | ICD-10-CM | POA: Diagnosis not present

## 2021-11-28 DIAGNOSIS — R911 Solitary pulmonary nodule: Secondary | ICD-10-CM | POA: Diagnosis not present

## 2021-11-28 DIAGNOSIS — J449 Chronic obstructive pulmonary disease, unspecified: Secondary | ICD-10-CM | POA: Diagnosis not present

## 2022-01-17 ENCOUNTER — Telehealth: Payer: Self-pay | Admitting: *Deleted

## 2022-01-17 ENCOUNTER — Encounter: Payer: Self-pay | Admitting: *Deleted

## 2022-01-17 NOTE — Patient Outreach (Signed)
  Care Coordination   Follow Up Visit Note   01/17/2022 Name: Heidi Moran MRN: [id number] DOB: [birth year]  Heidi Moran is a 86 y.o. year old female who sees Burnard Bunting, MD for primary care. I spoke with  Heidi Moran by phone today.  What matters to the patients health and wellness today?  I'm getting along fine. No falls. No breathing problems beyond her normal.    Goals Addressed               This Visit's Progress     Patient Stated     COMPLETED: I will do what I can to prevent falls. (pt-stated)   On track     Care Coordination Interventions: Provided written and verbal education re: potential causes of falls and Fall prevention strategies Advised patient of importance of notifying provider of falls Assessed for signs and symptoms of orthostatic hypotension Assessed for falls since last encounter Assessed patients knowledge of fall risk prevention secondary to previously provided education  Pt denies any falls since our last conversation. She reports she is being cautious. She says they have decided not to move her bedroom down stairs or get a stair lift. She is still using the stairs at a slow pace. She denies any dizziness upon standing.  Reinforced fall precautions.         GOAL COMPLETED NO FURTHER NEEDS AT THIS TIME. CLOSING CASE. SDOH assessments and interventions completed:  Yes Previously assessed.   Care Coordination Interventions Activated:  Yes  Care Coordination Interventions:  Yes, provided   Follow up plan: No further intervention required.   Encounter Outcome:  Pt. Visit Completed   Kayleen Memos C. Myrtie Neither, MSN, [facility] Gerontological Nurse Practitioner [facility] Care Management [phone]

## 2022-01-21 ENCOUNTER — Ambulatory Visit (INDEPENDENT_AMBULATORY_CARE_PROVIDER_SITE_OTHER): Payer: Medicare Other | Admitting: Podiatry

## 2022-01-21 ENCOUNTER — Encounter: Payer: Self-pay | Admitting: Podiatry

## 2022-01-21 DIAGNOSIS — M79674 Pain in right toe(s): Secondary | ICD-10-CM | POA: Diagnosis not present

## 2022-01-21 DIAGNOSIS — M79675 Pain in left toe(s): Secondary | ICD-10-CM | POA: Diagnosis not present

## 2022-01-21 DIAGNOSIS — B351 Tinea unguium: Secondary | ICD-10-CM | POA: Diagnosis not present

## 2022-01-21 DIAGNOSIS — E1165 Type 2 diabetes mellitus with hyperglycemia: Secondary | ICD-10-CM

## 2022-01-21 NOTE — Progress Notes (Unsigned)
This patient returns to my office for at risk foot care.  This patient requires this care by a professional since this patient will be at risk due to having diabetes diet controlled  This patient is unable to cut nails herself since the patient cannot reach her nails.These nails are painful walking and wearing shoes.  This patient presents for at risk foot care today.  General Appearance  Alert, conversant and in no acute stress.  Vascular  Dorsalis pedis and posterior tibial  pulses are absent  bilaterally.  Capillary return is within normal limits  bilaterally. Temperature is within normal limits  bilaterally. Venous pathology ankles  B/L.  Neurologic  Senn-Weinstein monofilament wire test within normal limits .  LOPS diminished left. Muscle power within normal limits bilaterally.  Nails Thick disfigured discolored nails with subungual debris  from hallux to fifth toes bilaterally. No evidence of bacterial infection or drainage bilaterally.  Orthopedic  No limitations of motion  feet .  No crepitus or effusions noted.  No bony pathology or digital deformities noted.  Skin  normotropic skin with no porokeratosis noted bilaterally.  No signs of infections or ulcers noted.     Onychomycosis  Pain in right toes  Pain in left toes  Consent was obtained for treatment procedures.   Mechanical debridement of nails 1-5  bilaterally performed with a nail nipper.  Filed with dremel without incident.    Return office visit   3  months                   Told patient to return for periodic foot care and evaluation due to potential at risk complications.   Gardiner Barefoot DPM

## 2022-01-22 ENCOUNTER — Encounter: Payer: Self-pay | Admitting: Podiatry

## 2022-02-20 DIAGNOSIS — E785 Hyperlipidemia, unspecified: Secondary | ICD-10-CM | POA: Diagnosis not present

## 2022-02-20 DIAGNOSIS — R7989 Other specified abnormal findings of blood chemistry: Secondary | ICD-10-CM | POA: Diagnosis not present

## 2022-02-20 DIAGNOSIS — N183 Chronic kidney disease, stage 3 unspecified: Secondary | ICD-10-CM | POA: Diagnosis not present

## 2022-02-20 DIAGNOSIS — I10 Essential (primary) hypertension: Secondary | ICD-10-CM | POA: Diagnosis not present

## 2022-02-20 DIAGNOSIS — D649 Anemia, unspecified: Secondary | ICD-10-CM | POA: Diagnosis not present

## 2022-02-20 DIAGNOSIS — E1129 Type 2 diabetes mellitus with other diabetic kidney complication: Secondary | ICD-10-CM | POA: Diagnosis not present

## 2022-02-27 DIAGNOSIS — R82998 Other abnormal findings in urine: Secondary | ICD-10-CM | POA: Diagnosis not present

## 2022-02-27 DIAGNOSIS — Z0000 Encounter for general adult medical examination without abnormal findings: Secondary | ICD-10-CM | POA: Diagnosis not present

## 2022-02-27 DIAGNOSIS — E1129 Type 2 diabetes mellitus with other diabetic kidney complication: Secondary | ICD-10-CM | POA: Diagnosis not present

## 2022-02-27 DIAGNOSIS — M419 Scoliosis, unspecified: Secondary | ICD-10-CM | POA: Diagnosis not present

## 2022-02-27 DIAGNOSIS — Z1339 Encounter for screening examination for other mental health and behavioral disorders: Secondary | ICD-10-CM | POA: Diagnosis not present

## 2022-02-27 DIAGNOSIS — Z1331 Encounter for screening for depression: Secondary | ICD-10-CM | POA: Diagnosis not present

## 2022-02-27 DIAGNOSIS — I129 Hypertensive chronic kidney disease with stage 1 through stage 4 chronic kidney disease, or unspecified chronic kidney disease: Secondary | ICD-10-CM | POA: Diagnosis not present

## 2022-02-27 DIAGNOSIS — I10 Essential (primary) hypertension: Secondary | ICD-10-CM | POA: Diagnosis not present

## 2022-02-27 DIAGNOSIS — N183 Chronic kidney disease, stage 3 unspecified: Secondary | ICD-10-CM | POA: Diagnosis not present

## 2022-02-27 DIAGNOSIS — E119 Type 2 diabetes mellitus without complications: Secondary | ICD-10-CM | POA: Diagnosis not present

## 2022-02-27 DIAGNOSIS — J449 Chronic obstructive pulmonary disease, unspecified: Secondary | ICD-10-CM | POA: Diagnosis not present

## 2022-02-27 DIAGNOSIS — E11319 Type 2 diabetes mellitus with unspecified diabetic retinopathy without macular edema: Secondary | ICD-10-CM | POA: Diagnosis not present

## 2022-02-27 DIAGNOSIS — D638 Anemia in other chronic diseases classified elsewhere: Secondary | ICD-10-CM | POA: Diagnosis not present

## 2022-04-02 DIAGNOSIS — H52203 Unspecified astigmatism, bilateral: Secondary | ICD-10-CM | POA: Diagnosis not present

## 2022-04-02 DIAGNOSIS — E119 Type 2 diabetes mellitus without complications: Secondary | ICD-10-CM | POA: Diagnosis not present

## 2022-04-02 DIAGNOSIS — D3131 Benign neoplasm of right choroid: Secondary | ICD-10-CM | POA: Diagnosis not present

## 2022-04-02 DIAGNOSIS — H18593 Other hereditary corneal dystrophies, bilateral: Secondary | ICD-10-CM | POA: Diagnosis not present

## 2022-04-10 ENCOUNTER — Encounter: Payer: Self-pay | Admitting: Pulmonary Disease

## 2022-04-10 ENCOUNTER — Ambulatory Visit (INDEPENDENT_AMBULATORY_CARE_PROVIDER_SITE_OTHER): Payer: Medicare Other | Admitting: Pulmonary Disease

## 2022-04-10 VITALS — BP 126/72 | HR 76 | Temp 98.3°F | Ht 59.0 in | Wt 108.2 lb

## 2022-04-10 DIAGNOSIS — J449 Chronic obstructive pulmonary disease, unspecified: Secondary | ICD-10-CM

## 2022-04-10 DIAGNOSIS — R918 Other nonspecific abnormal finding of lung field: Secondary | ICD-10-CM

## 2022-04-10 NOTE — Progress Notes (Signed)
Heidi Moran    [id number]    1936/01/19  Primary Care Physician:Aronson, Delfino Lovett, MD  Referring Physician: Burnard Bunting, MD [address] [place],  [place] [address]  Chief complaint:  Follow up for  Rheumatoid lung nodules Hypoxic respiratory failure on oxygen.  HPI: 87 y.o. with history of rheumatoid arthritis, diabetes, hyperlipidemia, hypertension.  She was evaluated in the ED recently fatigue, cough with a chest x-ray that showed lung nodules.  She had a follow-up CT scan and PET scan which demonstrated multiple pulmonary nodules, mild uptake in the dominant nodule.  [Reports below]. She has history of rheumatoid arthritis diagnosed about in 2018.  She follows with Dr. Trudie Reed, previously treated with Golinumab infusions which was held after she developed pneumonia.  Hospitalized in March 2019 for acute respiratory failure with bronchospasm and hypoxia, upper respiratory tract infection. Flu test was negative, CT chest showed no pulmonary embolism or acute lung abnormality Treated with steroids with improvement in symptoms and discharged on supplemental oxygen to SNF.  Hospitalized in November 2021 for dyspnea, acute on chronic respiratory failure, suspected COPD exacerbation Treated with prednisone and started on Anoro.  Received note from Dr. Trudie Reed, rheumatology dated 10/15/2017 Follow-up for rheumatoid arthritis with stable symptoms, no flareups Previously on Golimumab but currently not on therapy.  Pets: Cats.  No birds, farm animals Occupation: Housewife Exposures: None exposures Smoking history: Never smoker Travel History:Grew up in [place].  Moved to [place] 35 years ago.  Interim history: Continues on Anoro. Has good days bad days.  Overall stable since prior visit. Continues on supplemental oxygen.   Outpatient Encounter Medications as of 04/10/2022  Medication Sig   acetaminophen (TYLENOL) 325 MG tablet Take 2 tablets (650 mg total) by  mouth every 6 (six) hours as needed for up to 30 doses for moderate pain or mild pain.   albuterol (VENTOLIN HFA) 108 (90 Base) MCG/ACT inhaler Inhale 2 puffs into the lungs every 6 (six) hours as needed for wheezing or shortness of breath.   atorvastatin (LIPITOR) 20 MG tablet TAKE 1 TABLET orally once a day for 90 days   Cholecalciferol (VITAMIN D3) 25 MCG (1000 UT) CAPS 1 capsule Orally Once a day   famotidine (PEPCID) 20 MG tablet TAKE 1 TABLET AT BEDTIME (NEED APPOINTMENT FOR FURTHER REFILLS)   glyBURIDE-metformin (GLUCOVANCE) 2.5-500 MG tablet Take 1 tablet by mouth daily with breakfast. Pt takes one in the morning with breakfast and one in the evening   lisinopril (ZESTRIL) 10 MG tablet TAKE 1 TABLET orally once a day for 90 days   loperamide (IMODIUM) 2 MG capsule Take 1 capsule (2 mg total) by mouth 4 (four) times daily as needed for diarrhea or loose stools.   OXYGEN Inhale 2 L/day into the lungs daily.   umeclidinium-vilanterol (ANORO ELLIPTA) 62.5-25 MCG/ACT AEPB USE 1 INHALATION DAILY (WILL NEED OFFICE VISIT FOR FUTURE REFILLS)   [DISCONTINUED] ibuprofen (ADVIL) 600 MG tablet 1 tablet with food or milk as needed Orally Three times a day   No facility-administered encounter medications on file as of 04/10/2022.   Physical Exam: Blood pressure 126/72, pulse 76, temperature 98.3 F (36.8 C), temperature source Oral, height '4\' 11"'$  (1.499 m), weight 108 lb 3.2 oz (49.1 kg), SpO2 97 %. Gen:      No acute distress HEENT:  EOMI, sclera anicteric Neck:     No masses; no thyromegaly Lungs:    Clear to auscultation bilaterally; normal respiratory effort CV:  Regular rate and rhythm; no murmurs Abd:      + bowel sounds; soft, non-tender; no palpable masses, no distension Ext:    No edema; adequate peripheral perfusion Skin:      Warm and dry; no rash Neuro: alert and oriented x 3 Psych: normal mood and affect   Data Reviewed: Imaging CT chest 02/12/17-borderline precarinal lymph  nodes, multiple pulmonary nodules bilaterally.  The largest one in the right upper lobe measures 13 x11 mm. PET scan 02/27/17-dominant nodule in the right middle lobe has mild-moderate FDG uptake.  No other areas of uptake. CTA 05/17/17-no pulmonary embolism, stable lung nodules. CT scan 06/11/17-stability of pulmonary nodules the largest is 1.3 cm in the right middle lobe. CT scan 12/12/2017- stable multiple bilateral nodules.  New compression deformity in L1. CT scan 12/16/2018- some of the pulmonary nodules for change in morphology to lobular with possible cavitation. CT scan 06/21/2019-stable pulmonary nodules.   CT chest 06/23/2020-stable pulmonary nodules  I have reviewed the images personally.  PFTs  12/15/2017 FVC 0.95 (47%), FEV1 0.42 [28%), F/F 44, TLC 142%, DLCO 38% Severe obstruction, diffusion impairment  FENO 06/24/17-36  Labs CBC 06/24/2017-WBC 6.2, eos 1.5%, absolute eosinophil count 100 Blood allergy profile 06/24/2017-IgE to 32, sensitive to cat, Aspergillus, mouse urine Alpha-1 antitrypsin 12/15/2017-155, PI MM  Assessment:  Severe obstruction PFTs reviewed which shows severe obstruction which is surprising as she is a non-smoker. Alpha-1 antitrypsin levels are okay Continue Anoro, supplemental oxygen She will be qualified for portable COVID treated today                                 Follow up for multiple pulmonary nodules Her images reviewed which shows stable rounded nodular opacities with mild PET activity.  Per report from primary care this has been present since at least 2017. This could be consistent with rheumatoid nodules which are are known to have low FDG uptake. The PET scan does not reveal any other abnormal areas of uptake.  She is a non-smoker with low risk of malignancy.    We had demonstrated stability for over 4 years and does not need additional follow-up.  GERD Continue Pepcid at night  Plan/Recommendations: - Continue Anoro, supplemental  oxygen  Marshell Garfinkel MD [facility] 04/10/2022, 10:38 AM  CC: Burnard Bunting, MD

## 2022-04-10 NOTE — Patient Instructions (Signed)
I am glad you are stable with breathing Will send an order for portable concentrator Continue the inhaler Follow-up in 6 months.

## 2022-04-10 NOTE — Addendum Note (Signed)
Addended by: Irine Seal B on: 04/10/2022 11:41 AM   Modules accepted: Orders

## 2022-04-23 ENCOUNTER — Ambulatory Visit (INDEPENDENT_AMBULATORY_CARE_PROVIDER_SITE_OTHER): Payer: Medicare Other | Admitting: Podiatry

## 2022-04-23 ENCOUNTER — Encounter: Payer: Self-pay | Admitting: Podiatry

## 2022-04-23 DIAGNOSIS — E1165 Type 2 diabetes mellitus with hyperglycemia: Secondary | ICD-10-CM

## 2022-04-23 DIAGNOSIS — M79674 Pain in right toe(s): Secondary | ICD-10-CM

## 2022-04-23 DIAGNOSIS — M79675 Pain in left toe(s): Secondary | ICD-10-CM

## 2022-04-23 DIAGNOSIS — B351 Tinea unguium: Secondary | ICD-10-CM | POA: Diagnosis not present

## 2022-04-23 NOTE — Progress Notes (Signed)
This patient returns to my office for at risk foot care.  This patient requires this care by a professional since this patient will be at risk due to having diabetes diet controlled  This patient is unable to cut nails herself since the patient cannot reach her nails.These nails are painful walking and wearing shoes.  This patient presents for at risk foot care today.  General Appearance  Alert, conversant and in no acute stress.  Vascular  Dorsalis pedis and posterior tibial  pulses are absent  bilaterally.  Capillary return is within normal limits  bilaterally. Temperature is within normal limits  bilaterally. Venous pathology ankles  B/L.  Neurologic  Senn-Weinstein monofilament wire test within normal limits .  LOPS diminished left. Muscle power within normal limits bilaterally.  Nails Thick disfigured discolored nails with subungual debris  from hallux to fifth toes bilaterally. No evidence of bacterial infection or drainage bilaterally.  Orthopedic  No limitations of motion  feet .  No crepitus or effusions noted.  No bony pathology or digital deformities noted.  Skin  normotropic skin with no porokeratosis noted bilaterally.  No signs of infections or ulcers noted.     Onychomycosis  Pain in right toes  Pain in left toes  Consent was obtained for treatment procedures.   Mechanical debridement of nails 1-5  bilaterally performed with a nail nipper.  Filed with dremel without incident.    Return office visit   3  months                   Told patient to return for periodic foot care and evaluation due to potential at risk complications.   Gardiner Barefoot DPM

## 2022-07-08 DIAGNOSIS — J449 Chronic obstructive pulmonary disease, unspecified: Secondary | ICD-10-CM | POA: Diagnosis not present

## 2022-07-08 DIAGNOSIS — N183 Chronic kidney disease, stage 3 unspecified: Secondary | ICD-10-CM | POA: Diagnosis not present

## 2022-07-08 DIAGNOSIS — E1129 Type 2 diabetes mellitus with other diabetic kidney complication: Secondary | ICD-10-CM | POA: Diagnosis not present

## 2022-07-08 DIAGNOSIS — I129 Hypertensive chronic kidney disease with stage 1 through stage 4 chronic kidney disease, or unspecified chronic kidney disease: Secondary | ICD-10-CM | POA: Diagnosis not present

## 2022-07-23 ENCOUNTER — Ambulatory Visit (INDEPENDENT_AMBULATORY_CARE_PROVIDER_SITE_OTHER): Payer: Medicare Other | Admitting: Podiatry

## 2022-07-23 ENCOUNTER — Encounter: Payer: Self-pay | Admitting: Podiatry

## 2022-07-23 DIAGNOSIS — M79675 Pain in left toe(s): Secondary | ICD-10-CM | POA: Diagnosis not present

## 2022-07-23 DIAGNOSIS — B351 Tinea unguium: Secondary | ICD-10-CM | POA: Diagnosis not present

## 2022-07-23 DIAGNOSIS — M79674 Pain in right toe(s): Secondary | ICD-10-CM | POA: Diagnosis not present

## 2022-07-23 DIAGNOSIS — E1165 Type 2 diabetes mellitus with hyperglycemia: Secondary | ICD-10-CM

## 2022-07-23 NOTE — Progress Notes (Signed)
This patient returns to my office for at risk foot care.  This patient requires this care by a professional since this patient will be at risk due to having diabetes diet controlled  This patient is unable to cut nails herself since the patient cannot reach her nails.These nails are painful walking and wearing shoes.  This patient presents for at risk foot care today.  General Appearance  Alert, conversant and in no acute stress.  Vascular  Dorsalis pedis and posterior tibial  pulses are absent  bilaterally.  Capillary return is within normal limits  bilaterally. Temperature is within normal limits  bilaterally. Venous pathology ankles  B/L.  Neurologic  Senn-Weinstein monofilament wire test within normal limits .  LOPS diminished left. Muscle power within normal limits bilaterally.  Nails Thick disfigured discolored nails with subungual debris  from hallux to fifth toes bilaterally. No evidence of bacterial infection or drainage bilaterally.  Orthopedic  No limitations of motion  feet .  No crepitus or effusions noted.  No bony pathology or digital deformities noted.  Skin  normotropic skin with no porokeratosis noted bilaterally.  No signs of infections or ulcers noted.     Onychomycosis  Pain in right toes  Pain in left toes  Consent was obtained for treatment procedures.   Mechanical debridement of nails 1-5  bilaterally performed with a nail nipper.  Filed with dremel without incident.    Return office visit   3  months                   Told patient to return for periodic foot care and evaluation due to potential at risk complications.   Ashantee Deupree DPM   

## 2022-08-26 ENCOUNTER — Other Ambulatory Visit: Payer: Self-pay | Admitting: Pulmonary Disease

## 2022-08-30 ENCOUNTER — Other Ambulatory Visit: Payer: Self-pay

## 2022-08-30 ENCOUNTER — Telehealth: Payer: Self-pay | Admitting: Pulmonary Disease

## 2022-08-30 MED ORDER — FAMOTIDINE 20 MG PO TABS: ORAL_TABLET | ORAL | 0 refills | Status: DC

## 2022-08-30 NOTE — Telephone Encounter (Signed)
ATC X1 LVM for patient. Please advise Famotidine has been sent to [facility] 

## 2022-08-30 NOTE — Telephone Encounter (Signed)
PT husband calling. They were denied RX fill due to not seen in a year, he said. I set earliest appt avail w/PA but that is not until mid-July. Can we still auth refill?   famotidine (PEPCID) 20 MG tablet   [facility]  His call back # is [phone]

## 2022-08-30 NOTE — Telephone Encounter (Signed)
ATC X1 LVM for patient. Please advise Famotidine has been sent to [facility]

## 2022-10-07 ENCOUNTER — Telehealth: Payer: Self-pay | Admitting: Nurse Practitioner

## 2022-10-07 ENCOUNTER — Encounter: Payer: Self-pay | Admitting: Nurse Practitioner

## 2022-10-07 ENCOUNTER — Ambulatory Visit (INDEPENDENT_AMBULATORY_CARE_PROVIDER_SITE_OTHER): Payer: Medicare Other | Admitting: Nurse Practitioner

## 2022-10-07 VITALS — BP 134/66 | HR 80 | Wt 104.8 lb

## 2022-10-07 DIAGNOSIS — J9611 Chronic respiratory failure with hypoxia: Secondary | ICD-10-CM | POA: Diagnosis not present

## 2022-10-07 DIAGNOSIS — K219 Gastro-esophageal reflux disease without esophagitis: Secondary | ICD-10-CM

## 2022-10-07 DIAGNOSIS — J449 Chronic obstructive pulmonary disease, unspecified: Secondary | ICD-10-CM | POA: Diagnosis not present

## 2022-10-07 MED ORDER — ALBUTEROL SULFATE HFA 108 (90 BASE) MCG/ACT IN AERS: 2.0000 | INHALATION_SPRAY | Freq: Four times a day (QID) | RESPIRATORY_TRACT | 4 refills | Status: DC | PRN

## 2022-10-07 MED ORDER — FAMOTIDINE 20 MG PO TABS: ORAL_TABLET | ORAL | 3 refills | Status: DC

## 2022-10-07 NOTE — Telephone Encounter (Signed)
Called and spoke with Onalee Hua (D[facility]). Albuterol refill sent to [facility].  Nothing further at this time.

## 2022-10-07 NOTE — Assessment & Plan Note (Signed)
Stable on pepcid. Rx refilled

## 2022-10-07 NOTE — Patient Instructions (Addendum)
Continue Albuterol inhaler 2 puffs every 6 hours as needed for shortness of breath or wheezing. Notify if symptoms persist despite rescue inhaler/neb use.  Continue Anoro 1 puff daily Continue famotidine 1 tab At bedtime  Continue supplemental oxygen 2 lpm for goal >88-90%   Follow up in 4-6 months with Dr. Isaiah Serge. If symptoms do not improve or worsen, please contact office for sooner follow up or seek emergency care.

## 2022-10-07 NOTE — Assessment & Plan Note (Signed)
Severe obstructive disease in a never smoker. Previously alpha 1 testing normal. She is compensated on current regimen. No recent exacerbations or hospitalizations. Action plan in place.  Patient Instructions  Continue Albuterol inhaler 2 puffs every 6 hours as needed for shortness of breath or wheezing. Notify if symptoms persist despite rescue inhaler/neb use.  Continue Anoro 1 puff daily Continue famotidine 1 tab At bedtime  Continue supplemental oxygen 2 lpm for goal >88-90%   Follow up in 4-6 months with Dr. Isaiah Serge. If symptoms do not improve or worsen, please contact office for sooner follow up or seek emergency care.

## 2022-10-07 NOTE — Assessment & Plan Note (Signed)
Stable. No increased O2 requirement. Goal >88-90%

## 2022-10-07 NOTE — Progress Notes (Signed)
@Patient  ID: Heidi Moran, female    DOB: June 13, 1935, 87 y.o.   MRN: [id number]  Chief Complaint  Patient presents with   Follow-up    Pt still has been issues with breathing, pt states she is feeling dehydrated. Pt on 2L of O2    Referring provider: Geoffry Paradise, MD  HPI: 87 year old female, never smoker followed for obstructive airway disease, lung nodules and chronic respiratory failure on supplemental O2. She is a patient of Dr. Shirlee More and last seen in office 04/10/2022. Past medical history significant for RA, DM, HLD, HTN, GERD.  TEST/EVENTS:  12/15/2017 PFT: FVC 49, FEV1 32, ratio 44, TLC 143, DLCOunc 38 corrects to normal for alveolar volume. No BD. 01/15/2020 echo: EF 60-65%. GIDD. Mild AV sclerosis without stenosis 06/23/2020 CT chest wo contrast: atherosclerosis. Scattered calcifications. No LAD. Small hiatal hernia. Numerous b/l rouded pulmonary nodules of varying sizes unchanged, largest 1.2 cm. Scattered associated ground-glass opacity.   04/10/2022: OV with Dr. Isaiah Serge. Hx of RA followed by Dr. Nickola Major; treated with golinumab infusions in the past but held after developing pna. Previous PFTs with severe obstruction in a never smoker. A1AT levels nl. Continues Anoro. Breathing stable. She has stable lung nodules, consistent with rheumatoid nodules. Demonstrated stability over 4 years; does not need follow up.   10/07/2022: Today - follow up Patient presents today for follow up with her husband. She has been stable since her last visit. Feels like her breathing is at her baseline. She gets short winded with long distances and household chores. She does not have any significant cough, chest congestion, wheezing. She is using her Anoro daily. Rarely uses her rescue inhaler. They don't remember the last time she used it. She wears her oxygen at all times; 2 lpm. No low levels at home. She needs a refill of her pepcid; feels GERD well controlled on this.   No Known  Allergies  Immunization History  Administered Date(s) Administered   Fluad Quad(high Dose 65+) 12/31/2021   Influenza, High Dose Seasonal PF 12/08/2017   Influenza,inj,Quad PF,6+ Mos 12/14/2016, 12/01/2018, 11/28/2020   Influenza-Unspecified 12/22/2019   PFIZER(Purple Top)SARS-COV-2 Vaccination 05/02/2019, 05/25/2019    Past Medical History:  Diagnosis Date   Anemia    Diabetes mellitus without complication (HCC)    Eczema    Hyperlipidemia    Hypertension    Osteoarthritis    Renal disorder    Rheumatoid arthritis (HCC)     Tobacco History: Social History   Tobacco Use  Smoking Status Never  Smokeless Tobacco Never   Counseling given: Not Answered   Outpatient Medications Prior to Visit  Medication Sig Dispense Refill   acetaminophen (TYLENOL) 325 MG tablet Take 2 tablets (650 mg total) by mouth every 6 (six) hours as needed for up to 30 doses for moderate pain or mild pain. 30 tablet 0   albuterol (VENTOLIN HFA) 108 (90 Base) MCG/ACT inhaler Inhale 2 puffs into the lungs every 6 (six) hours as needed for wheezing or shortness of breath. 3 each 0   atorvastatin (LIPITOR) 20 MG tablet TAKE 1 TABLET orally once a day for 90 days     Cholecalciferol (VITAMIN D3) 25 MCG (1000 UT) CAPS 1 capsule Orally Once a day     glyBURIDE-metformin (GLUCOVANCE) 2.5-500 MG tablet Take 1 tablet by mouth daily with breakfast. Pt takes one in the morning with breakfast and one in the evening     lisinopril (ZESTRIL) 10 MG tablet TAKE 1 TABLET orally  once a day for 90 days     loperamide (IMODIUM) 2 MG capsule Take 1 capsule (2 mg total) by mouth 4 (four) times daily as needed for diarrhea or loose stools. 12 capsule 0   OXYGEN Inhale 2 L/day into the lungs daily.     umeclidinium-vilanterol (ANORO ELLIPTA) 62.5-25 MCG/ACT AEPB USE 1 INHALATION DAILY (WILL NEED OFFICE VISIT FOR FUTURE REFILLS) 180 each 3   famotidine (PEPCID) 20 MG tablet TAKE 1 TABLET AT BEDTIME 90 tablet 0   No  facility-administered medications prior to visit.     Review of Systems:   Constitutional: No weight loss or gain, night sweats, fevers, chills, or lassitude. +fatigue (Baseline) HEENT: No headaches, difficulty swallowing, tooth/dental problems, or sore throat. No sneezing, itching, ear ache, nasal congestion, or post nasal drip CV:  No chest pain, orthopnea, PND, swelling in lower extremities, anasarca, dizziness, palpitations, syncope Resp: +shortness of breath with exertion (baseline). No excess mucus or change in color of mucus. No hemoptysis. No wheezing.  No chest wall deformity GI:  No heartburn, indigestion (with pepcid) Skin: No rash, lesions, ulcerations MSK:  No joint pain or swelling.   Neuro: No dizziness or lightheadedness.  Psych: No depression or anxiety. Mood stable.     Physical Exam:  BP 134/66   Pulse 80   Wt 104 lb 12.8 oz (47.5 kg)   SpO2 97%   BMI 21.17 kg/m   GEN: Pleasant, interactive, chronically-ill appearing; in no acute distress HEENT:  Normocephalic and atraumatic. PERRLA. Sclera white. Nasal turbinates pink, moist and patent bilaterally. No rhinorrhea present. Oropharynx pink and moist, without exudate or edema. No lesions, ulcerations, or postnasal drip.  NECK:  Supple w/ fair ROM. No JVD present. Normal carotid impulses w/o bruits. Thyroid symmetrical with no goiter or nodules palpated. No lymphadenopathy.   CV: RRR, no m/r/g, no peripheral edema. Pulses intact, +2 bilaterally. No cyanosis, pallor or clubbing. PULMONARY:  Unlabored, regular breathing. Diminished bibasilar airflow otherwise clear bilaterally A&P w/o wheezes/rales/rhonchi. No accessory muscle use.  GI: BS present and normoactive. Soft, non-tender to palpation. No organomegaly or masses detected.  MSK: No erythema, warmth or tenderness. Cap refil <2 sec all extrem. No deformities or joint swelling noted. Muscle wasting Neuro: A/Ox3. No focal deficits noted.   Skin: Warm, no lesions or  rashe Psych: Normal affect and behavior. Judgement and thought content appropriate.     Lab Results:  CBC    Component Value Date/Time   WBC 6.6 11/18/2021 1608   RBC 3.13 (L) 11/18/2021 1608   HGB 9.4 (L) 11/18/2021 1608   HCT 30.2 (L) 11/18/2021 1608   PLT 253 11/18/2021 1608   MCV 96.5 11/18/2021 1608   MCH 30.0 11/18/2021 1608   MCHC 31.1 11/18/2021 1608   RDW 12.6 11/18/2021 1608   LYMPHSABS 0.2 (L) 01/15/2020 0741   MONOABS 0.1 01/15/2020 0741   EOSABS 0.0 01/15/2020 0741   BASOSABS 0.0 01/15/2020 0741    BMET    Component Value Date/Time   NA 136 11/18/2021 1608   K 5.6 (H) 11/18/2021 1608   CL 97 (L) 11/18/2021 1608   CO2 36 (H) 11/18/2021 1608   GLUCOSE 129 (H) 11/18/2021 1608   BUN 26 (H) 11/18/2021 1608   CREATININE 0.88 11/18/2021 1608   CALCIUM 9.5 11/18/2021 1608   GFRNONAA >60 11/18/2021 1608   GFRAA 57 (L) 05/21/2017 0246    BNP    Component Value Date/Time   BNP 41.1 01/14/2020 2003  Imaging:  No results found.  Administration History     None          Latest Ref Rng & Units 12/15/2017   12:50 PM  PFT Results  FVC-Pre L 0.99   FVC-Predicted Pre % 49   FVC-Post L 0.95   FVC-Predicted Post % 47   Pre FEV1/FVC % % 48   Post FEV1/FCV % % 44   FEV1-Pre L 0.48   FEV1-Predicted Pre % 32   FEV1-Post L 0.42   DLCO uncorrected ml/min/mmHg 6.81   DLCO UNC% % 38   DLVA Predicted % 97   TLC L 6.16   TLC % Predicted % 143   RV % Predicted % 242     Lab Results  Component Value Date   NITRICOXIDE 36 06/24/2017        Assessment & Plan:   Obstructive airway disease (HCC) Severe obstructive disease in a never smoker. Previously alpha 1 testing normal. She is compensated on current regimen. No recent exacerbations or hospitalizations. Action plan in place.  Patient Instructions  Continue Albuterol inhaler 2 puffs every 6 hours as needed for shortness of breath or wheezing. Notify if symptoms persist despite rescue  inhaler/neb use.  Continue Anoro 1 puff daily Continue famotidine 1 tab At bedtime  Continue supplemental oxygen 2 lpm for goal >88-90%   Follow up in 4-6 months with Dr. Isaiah Serge. If symptoms do not improve or worsen, please contact office for sooner follow up or seek emergency care.    Chronic respiratory failure with hypoxia (HCC) Stable. No increased O2 requirement. Goal >88-90%  GERD without esophagitis Stable on pepcid. Rx refilled  I spent 28 minutes of dedicated to the care of this patient on the date of this encounter to include pre-visit review of records, face-to-face time with the patient discussing conditions above, post visit ordering of testing, clinical documentation with the electronic health record, making appropriate referrals as documented, and communicating necessary findings to members of the patients care team.  Noemi Chapel, NP 10/07/2022  Pt aware and understands NP's role.

## 2022-10-23 ENCOUNTER — Ambulatory Visit (INDEPENDENT_AMBULATORY_CARE_PROVIDER_SITE_OTHER): Payer: Medicare Other | Admitting: Podiatry

## 2022-10-23 ENCOUNTER — Encounter: Payer: Self-pay | Admitting: Podiatry

## 2022-10-23 DIAGNOSIS — B351 Tinea unguium: Secondary | ICD-10-CM | POA: Diagnosis not present

## 2022-10-23 DIAGNOSIS — M79674 Pain in right toe(s): Secondary | ICD-10-CM | POA: Diagnosis not present

## 2022-10-23 DIAGNOSIS — M79675 Pain in left toe(s): Secondary | ICD-10-CM | POA: Diagnosis not present

## 2022-10-23 DIAGNOSIS — E1165 Type 2 diabetes mellitus with hyperglycemia: Secondary | ICD-10-CM

## 2022-10-23 NOTE — Progress Notes (Signed)
This patient returns to my office for at risk foot care.  This patient requires this care by a professional since this patient will be at risk due to having diabetes diet controlled  This patient is unable to cut nails herself since the patient cannot reach her nails.These nails are painful walking and wearing shoes.  This patient presents for at risk foot care today.  General Appearance  Alert, conversant and in no acute stress.  Vascular  Dorsalis pedis and posterior tibial  pulses are absent  bilaterally.  Capillary return is within normal limits  bilaterally. Temperature is within normal limits  bilaterally. Venous pathology ankles  B/L.  Neurologic  Senn-Weinstein monofilament wire test within normal limits .  LOPS diminished left. Muscle power within normal limits bilaterally.  Nails Thick disfigured discolored nails with subungual debris  from hallux to fifth toes bilaterally. No evidence of bacterial infection or drainage bilaterally.  Orthopedic  No limitations of motion  feet .  No crepitus or effusions noted.  No bony pathology or digital deformities noted.  Skin  normotropic skin with no porokeratosis noted bilaterally.  No signs of infections or ulcers noted.     Onychomycosis  Pain in right toes  Pain in left toes  Consent was obtained for treatment procedures.   Mechanical debridement of nails 1-5  bilaterally performed with a nail nipper.  Filed with dremel without incident.    Return office visit   3  months                   Told patient to return for periodic foot care and evaluation due to potential at risk complications.     DPM   

## 2022-10-30 DIAGNOSIS — N1832 Chronic kidney disease, stage 3b: Secondary | ICD-10-CM | POA: Diagnosis not present

## 2022-10-30 DIAGNOSIS — M419 Scoliosis, unspecified: Secondary | ICD-10-CM | POA: Diagnosis not present

## 2022-10-30 DIAGNOSIS — I129 Hypertensive chronic kidney disease with stage 1 through stage 4 chronic kidney disease, or unspecified chronic kidney disease: Secondary | ICD-10-CM | POA: Diagnosis not present

## 2022-10-30 DIAGNOSIS — E11319 Type 2 diabetes mellitus with unspecified diabetic retinopathy without macular edema: Secondary | ICD-10-CM | POA: Diagnosis not present

## 2022-10-30 DIAGNOSIS — J449 Chronic obstructive pulmonary disease, unspecified: Secondary | ICD-10-CM | POA: Diagnosis not present

## 2022-10-30 DIAGNOSIS — E1129 Type 2 diabetes mellitus with other diabetic kidney complication: Secondary | ICD-10-CM | POA: Diagnosis not present

## 2022-11-05 ENCOUNTER — Other Ambulatory Visit: Payer: Self-pay | Admitting: Pulmonary Disease

## 2022-11-11 ENCOUNTER — Other Ambulatory Visit: Payer: Self-pay | Admitting: Pulmonary Disease

## 2022-11-11 MED ORDER — ANORO ELLIPTA 62.5-25 MCG/ACT IN AEPB: 1.0000 | INHALATION_SPRAY | Freq: Every day | RESPIRATORY_TRACT | 3 refills | Status: DC

## 2022-11-11 NOTE — Telephone Encounter (Signed)
Patient's husband, calling to check status of refill for Anoro. Please send to [facility]. Patient only has about 5 days left. Please call patient's husband when script has been refilled at [phone]

## 2022-11-27 ENCOUNTER — Ambulatory Visit: Payer: Medicare Other | Admitting: Podiatry

## 2023-01-29 ENCOUNTER — Encounter: Payer: Self-pay | Admitting: Podiatry

## 2023-01-29 ENCOUNTER — Ambulatory Visit (INDEPENDENT_AMBULATORY_CARE_PROVIDER_SITE_OTHER): Payer: Medicare Other | Admitting: Podiatry

## 2023-01-29 DIAGNOSIS — B351 Tinea unguium: Secondary | ICD-10-CM | POA: Diagnosis not present

## 2023-01-29 DIAGNOSIS — M79675 Pain in left toe(s): Secondary | ICD-10-CM

## 2023-01-29 DIAGNOSIS — M79674 Pain in right toe(s): Secondary | ICD-10-CM

## 2023-01-29 DIAGNOSIS — E1165 Type 2 diabetes mellitus with hyperglycemia: Secondary | ICD-10-CM

## 2023-01-29 NOTE — Progress Notes (Signed)
This patient returns to my office for at risk foot care.  This patient requires this care by a professional since this patient will be at risk due to having diabetes diet controlled  This patient is unable to cut nails herself since the patient cannot reach her nails.These nails are painful walking and wearing shoes.  This patient presents for at risk foot care today.  General Appearance  Alert, conversant and in no acute stress.  Vascular  Dorsalis pedis and posterior tibial  pulses are absent  bilaterally.  Capillary return is within normal limits  bilaterally. Temperature is within normal limits  bilaterally. Venous pathology ankles  B/L.  Neurologic  Senn-Weinstein monofilament wire test within normal limits .  LOPS diminished left. Muscle power within normal limits bilaterally.  Nails Thick disfigured discolored nails with subungual debris  from hallux to fifth toes bilaterally. No evidence of bacterial infection or drainage bilaterally.  Orthopedic  No limitations of motion  feet .  No crepitus or effusions noted.  No bony pathology or digital deformities noted.  Skin  normotropic skin with no porokeratosis noted bilaterally.  No signs of infections or ulcers noted.     Onychomycosis  Pain in right toes  Pain in left toes  Consent was obtained for treatment procedures.   Mechanical debridement of nails 1-5  bilaterally performed with a nail nipper.  Filed with dremel without incident.    Return office visit   3  months                   Told patient to return for periodic foot care and evaluation due to potential at risk complications.   Dandrea Widdowson DPM   

## 2023-02-05 ENCOUNTER — Encounter: Payer: Self-pay | Admitting: Pulmonary Disease

## 2023-02-05 ENCOUNTER — Ambulatory Visit: Payer: Medicare Other | Admitting: Pulmonary Disease

## 2023-02-05 VITALS — BP 130/60 | HR 81 | Temp 98.4°F | Ht 59.0 in | Wt 102.2 lb

## 2023-02-05 DIAGNOSIS — J449 Chronic obstructive pulmonary disease, unspecified: Secondary | ICD-10-CM | POA: Diagnosis not present

## 2023-02-05 DIAGNOSIS — Z23 Encounter for immunization: Secondary | ICD-10-CM

## 2023-02-05 DIAGNOSIS — J9611 Chronic respiratory failure with hypoxia: Secondary | ICD-10-CM | POA: Diagnosis not present

## 2023-02-05 DIAGNOSIS — K219 Gastro-esophageal reflux disease without esophagitis: Secondary | ICD-10-CM | POA: Diagnosis not present

## 2023-02-05 NOTE — Progress Notes (Signed)
Heidi Moran    [id number]    [birth year]  Primary Care Physician:Aronson, Heidi Burdock, MD  Referring Physician: Geoffry Paradise, MD [address] [place],  [place] [address]  Chief complaint:  Follow up for  Rheumatoid lung nodules Hypoxic respiratory failure on oxygen.  HPI: 87 y.o. with history of rheumatoid arthritis, diabetes, hyperlipidemia, hypertension.  She was evaluated in the ED recently fatigue, cough with a chest x-ray that showed lung nodules.  She had a follow-up CT scan and PET scan which demonstrated multiple pulmonary nodules, mild uptake in the dominant nodule.  [Reports below]. She has history of rheumatoid arthritis diagnosed about in 2018.  She follows with Dr. Nickola Moran, previously treated with Golinumab infusions which was held after she developed pneumonia.  Hospitalized in March 2019 for acute respiratory failure with bronchospasm and hypoxia, upper respiratory tract infection. Flu test was negative, CT chest showed no pulmonary embolism or acute lung abnormality Treated with steroids with improvement in symptoms and discharged on supplemental oxygen to SNF.  Hospitalized in November 2021 for dyspnea, acute on chronic respiratory failure, suspected COPD exacerbation Treated with prednisone and started on Anoro.  Received note from Dr. Nickola Moran, rheumatology dated 10/15/2017 Follow-up for rheumatoid arthritis with stable symptoms, no flareups Previously on Golimumab but currently not on therapy.  Pets: Cats.  No birds, farm animals Occupation: Housewife Exposures: None exposures Smoking history: Never smoker Travel History:Grew up in [place].  Moved to [place] 35 years ago.  Interim history: Discussed the use of AI scribe software for clinical note transcription with the patient, who gave verbal consent to proceed.  Heidi Moran, a patient with chronic obstructive pulmonary disease (COPD) on long-term oxygen therapy for six years, presents for a  routine follow-up. She reports no significant changes in her condition since her last visit. She uses Anoro Ellipta daily for her COPD and has only needed to use her albuterol sulfate inhaler once or twice since her last prescription refill. She expresses fatigue with her oxygen therapy but acknowledges its necessity.  She also reports poor sleep, often waking up an hour after going to bed. She has been using a walker with wheels to aid her mobility around the house, which she finds helpful. She also mentions shoulder pain, but it is unclear if this is a new or ongoing issue.   Outpatient Encounter Medications as of 02/05/2023  Medication Sig   acetaminophen (TYLENOL) 325 MG tablet Take 2 tablets (650 mg total) by mouth every 6 (six) hours as needed for up to 30 doses for moderate pain or mild pain.   albuterol (VENTOLIN HFA) 108 (90 Base) MCG/ACT inhaler Inhale 2 puffs into the lungs every 6 (six) hours as needed for wheezing or shortness of breath.   atorvastatin (LIPITOR) 20 MG tablet TAKE 1 TABLET orally once a day for 90 days   Cholecalciferol (VITAMIN D3) 25 MCG (1000 UT) CAPS 1 capsule Orally Once a day   famotidine (PEPCID) 20 MG tablet TAKE 1 TABLET AT BEDTIME   glyBURIDE-metformin (GLUCOVANCE) 2.5-500 MG tablet Take 1 tablet by mouth daily with breakfast. Pt takes one in the morning with breakfast and one in the evening   lisinopril (ZESTRIL) 10 MG tablet TAKE 1 TABLET orally once a day for 90 days   OXYGEN Inhale 2 L/day into the lungs daily.   umeclidinium-vilanterol (ANORO ELLIPTA) 62.5-25 MCG/ACT AEPB Inhale 1 puff into the lungs daily.   [DISCONTINUED] loperamide (IMODIUM) 2 MG capsule Take 1  capsule (2 mg total) by mouth 4 (four) times daily as needed for diarrhea or loose stools. (Patient not taking: Reported on 02/05/2023)   No facility-administered encounter medications on file as of 02/05/2023.   Physical Exam: Blood pressure 130/60, pulse 81, temperature 98.4 F (36.9 C),  temperature source Oral, height 4\' 11"  (1.499 m), weight 102 lb 3.2 oz (46.4 kg), SpO2 94%. Gen:      No acute distress HEENT:  EOMI, sclera anicteric Neck:     No masses; no thyromegaly Lungs:    Clear to auscultation bilaterally; normal respiratory effort CV:         Regular rate and rhythm; no murmurs Abd:      + bowel sounds; soft, non-tender; no palpable masses, no distension Ext:    No edema; adequate peripheral perfusion Skin:      Warm and dry; no rash Neuro: alert and oriented x 3 Psych: normal mood and affect   Data Reviewed: Imaging CT chest 02/12/17-borderline precarinal lymph nodes, multiple pulmonary nodules bilaterally.  The largest one in the right upper lobe measures 13 x11 mm. PET scan 02/27/17-dominant nodule in the right middle lobe has mild-moderate FDG uptake.  No other areas of uptake. CTA 05/17/17-no pulmonary embolism, stable lung nodules. CT scan 06/11/17-stability of pulmonary nodules the largest is 1.3 cm in the right middle lobe. CT scan 12/12/2017- stable multiple bilateral nodules.  New compression deformity in L1. CT scan 12/16/2018- some of the pulmonary nodules for change in morphology to lobular with possible cavitation. CT scan 06/21/2019-stable pulmonary nodules.   CT chest 06/23/2020-stable pulmonary nodules CT abdomen pelvis 11/18/2021-visualized lung bases are stable with stable lung nodules. I have reviewed the images personally.  PFTs  12/15/2017 FVC 0.95 (47%), FEV1 0.42 [28%), F/F 44, TLC 142%, DLCO 38% Severe obstruction, diffusion impairment  FENO 06/24/17-36  Labs CBC 06/24/2017-WBC 6.2, eos 1.5%, absolute eosinophil count 100 Blood allergy profile 06/24/2017-IgE to 32, sensitive to cat, Aspergillus, mouse urine Alpha-1 antitrypsin 12/15/2017-155, PI MM  Assessment:  Severe obstruction PFTs reviewed which shows severe obstruction which is surprising as she is a non-smoker. Alpha-1 antitrypsin levels are okay  Stable on Anoro Ellipta daily and  Albuterol Sulfate as needed. No changes in symptoms or medication usage. -Continue current regimen of Anoro Ellipta and Albuterol Sulfate as needed. - Supplemental oxygen via portable concentrator                                 Follow up for multiple pulmonary nodules Her images reviewed which shows stable rounded nodular opacities with mild PET activity.  Per report from primary care this has been present since at least 2017. This could be consistent with rheumatoid nodules which are are known to have low FDG uptake. The PET scan does not reveal any other abnormal areas of uptake.  She is a non-smoker with low risk of malignancy.  Abdominal CT from last year reviewed with stable lung nodules.  We had demonstrated stability for over 4 years and does not need additional follow-up.  Insomnia Reports difficulty staying asleep. -No specific plan discussed.  GERD Continue Pepcid at night  General Health Maintenance -Administer influenza vaccine today. -Discussed receiving COVID and RSV vaccines at a later date at a different location.    Plan/Recommendations: - Continue Anoro, supplemental oxygen  Chilton Greathouse MD [facility] 02/05/2023, 1:53 PM  CC: Heidi Paradise, MD

## 2023-02-05 NOTE — Addendum Note (Signed)
Addended by: Delrae Rend on: 02/05/2023 05:07 PM   Modules accepted: Orders

## 2023-02-05 NOTE — Patient Instructions (Signed)
VISIT SUMMARY:  Heidi Moran, you came in today for a routine follow-up for your chronic obstructive pulmonary disease (COPD). You reported no significant changes in your condition and mentioned some issues with sleep and shoulder pain. We also discussed your general health maintenance, including vaccinations.  YOUR PLAN:  -CHRONIC OBSTRUCTIVE PULMONARY DISEASE (COPD): COPD is a chronic lung condition that makes it hard to breathe. You are stable on your current medications, Anoro Ellipta daily and Albuterol Sulfate as needed. Please continue with this regimen as it is working well for you.  -INSOMNIA: Insomnia is difficulty falling or staying asleep. You mentioned having trouble staying asleep, but we did not discuss a specific plan today. Please monitor your sleep patterns and let us know if this continues to be an issue.  -GENERAL HEALTH MAINTENANCE: We administered your influenza vaccine today. We also discussed getting the COVID and RSV vaccines at a later date at a different location. Keeping up with these vaccinations is important for your overall health.  INSTRUCTIONS:  Please continue with your current COPD medications and monitor your sleep patterns. If your sleep issues persist, let us know. Remember to get your COVID and RSV vaccines at a later date.

## 2023-03-05 DIAGNOSIS — J9691 Respiratory failure, unspecified with hypoxia: Secondary | ICD-10-CM | POA: Diagnosis not present

## 2023-03-05 DIAGNOSIS — E11319 Type 2 diabetes mellitus with unspecified diabetic retinopathy without macular edema: Secondary | ICD-10-CM | POA: Diagnosis not present

## 2023-03-05 DIAGNOSIS — E1129 Type 2 diabetes mellitus with other diabetic kidney complication: Secondary | ICD-10-CM | POA: Diagnosis not present

## 2023-03-05 DIAGNOSIS — J449 Chronic obstructive pulmonary disease, unspecified: Secondary | ICD-10-CM | POA: Diagnosis not present

## 2023-03-05 DIAGNOSIS — N1832 Chronic kidney disease, stage 3b: Secondary | ICD-10-CM | POA: Diagnosis not present

## 2023-03-05 DIAGNOSIS — I129 Hypertensive chronic kidney disease with stage 1 through stage 4 chronic kidney disease, or unspecified chronic kidney disease: Secondary | ICD-10-CM | POA: Diagnosis not present

## 2023-03-05 DIAGNOSIS — M069 Rheumatoid arthritis, unspecified: Secondary | ICD-10-CM | POA: Diagnosis not present

## 2023-03-05 DIAGNOSIS — M419 Scoliosis, unspecified: Secondary | ICD-10-CM | POA: Diagnosis not present

## 2023-04-09 DIAGNOSIS — Z1389 Encounter for screening for other disorder: Secondary | ICD-10-CM | POA: Diagnosis not present

## 2023-04-09 DIAGNOSIS — R509 Fever, unspecified: Secondary | ICD-10-CM | POA: Diagnosis not present

## 2023-04-09 DIAGNOSIS — Z9181 History of falling: Secondary | ICD-10-CM | POA: Diagnosis not present

## 2023-04-09 DIAGNOSIS — R3589 Other polyuria: Secondary | ICD-10-CM | POA: Diagnosis not present

## 2023-04-09 DIAGNOSIS — J9611 Chronic respiratory failure with hypoxia: Secondary | ICD-10-CM | POA: Diagnosis not present

## 2023-04-09 DIAGNOSIS — I129 Hypertensive chronic kidney disease with stage 1 through stage 4 chronic kidney disease, or unspecified chronic kidney disease: Secondary | ICD-10-CM | POA: Diagnosis not present

## 2023-04-09 DIAGNOSIS — J449 Chronic obstructive pulmonary disease, unspecified: Secondary | ICD-10-CM | POA: Diagnosis not present

## 2023-04-09 DIAGNOSIS — M069 Rheumatoid arthritis, unspecified: Secondary | ICD-10-CM | POA: Diagnosis not present

## 2023-04-09 DIAGNOSIS — R7989 Other specified abnormal findings of blood chemistry: Secondary | ICD-10-CM | POA: Diagnosis not present

## 2023-04-09 DIAGNOSIS — D649 Anemia, unspecified: Secondary | ICD-10-CM | POA: Diagnosis not present

## 2023-04-09 DIAGNOSIS — M25552 Pain in left hip: Secondary | ICD-10-CM | POA: Diagnosis not present

## 2023-04-09 DIAGNOSIS — Z1152 Encounter for screening for COVID-19: Secondary | ICD-10-CM | POA: Diagnosis not present

## 2023-04-09 DIAGNOSIS — R197 Diarrhea, unspecified: Secondary | ICD-10-CM | POA: Diagnosis not present

## 2023-04-09 DIAGNOSIS — N1832 Chronic kidney disease, stage 3b: Secondary | ICD-10-CM | POA: Diagnosis not present

## 2023-04-18 ENCOUNTER — Emergency Department (HOSPITAL_COMMUNITY): Payer: Medicare Other

## 2023-04-18 ENCOUNTER — Other Ambulatory Visit: Payer: Self-pay

## 2023-04-18 ENCOUNTER — Encounter (HOSPITAL_COMMUNITY): Payer: Self-pay | Admitting: *Deleted

## 2023-04-18 ENCOUNTER — Inpatient Hospital Stay (HOSPITAL_COMMUNITY)
Admission: EM | Admit: 2023-04-18 | Discharge: 2023-04-22 | DRG: 189 | Disposition: A | Discharge status: Skilled Nursing Facility | Payer: Medicare Other | Attending: Internal Medicine | Admitting: Internal Medicine

## 2023-04-18 DIAGNOSIS — K8020 Calculus of gallbladder without cholecystitis without obstruction: Secondary | ICD-10-CM | POA: Diagnosis not present

## 2023-04-18 DIAGNOSIS — I771 Stricture of artery: Secondary | ICD-10-CM | POA: Diagnosis not present

## 2023-04-18 DIAGNOSIS — Z9981 Dependence on supplemental oxygen: Secondary | ICD-10-CM | POA: Diagnosis not present

## 2023-04-18 DIAGNOSIS — R0689 Other abnormalities of breathing: Secondary | ICD-10-CM | POA: Diagnosis not present

## 2023-04-18 DIAGNOSIS — I10 Essential (primary) hypertension: Secondary | ICD-10-CM | POA: Diagnosis not present

## 2023-04-18 DIAGNOSIS — J4489 Other specified chronic obstructive pulmonary disease: Secondary | ICD-10-CM | POA: Diagnosis not present

## 2023-04-18 DIAGNOSIS — E1165 Type 2 diabetes mellitus with hyperglycemia: Secondary | ICD-10-CM | POA: Diagnosis present

## 2023-04-18 DIAGNOSIS — I130 Hypertensive heart and chronic kidney disease with heart failure and stage 1 through stage 4 chronic kidney disease, or unspecified chronic kidney disease: Secondary | ICD-10-CM | POA: Diagnosis present

## 2023-04-18 DIAGNOSIS — M069 Rheumatoid arthritis, unspecified: Secondary | ICD-10-CM | POA: Diagnosis present

## 2023-04-18 DIAGNOSIS — Z7401 Bed confinement status: Secondary | ICD-10-CM | POA: Diagnosis not present

## 2023-04-18 DIAGNOSIS — R2689 Other abnormalities of gait and mobility: Secondary | ICD-10-CM | POA: Diagnosis not present

## 2023-04-18 DIAGNOSIS — N3001 Acute cystitis with hematuria: Secondary | ICD-10-CM | POA: Diagnosis not present

## 2023-04-18 DIAGNOSIS — Z66 Do not resuscitate: Secondary | ICD-10-CM | POA: Diagnosis present

## 2023-04-18 DIAGNOSIS — J449 Chronic obstructive pulmonary disease, unspecified: Secondary | ICD-10-CM | POA: Diagnosis not present

## 2023-04-18 DIAGNOSIS — Z794 Long term (current) use of insulin: Secondary | ICD-10-CM | POA: Diagnosis not present

## 2023-04-18 DIAGNOSIS — J9601 Acute respiratory failure with hypoxia: Secondary | ICD-10-CM | POA: Diagnosis not present

## 2023-04-18 DIAGNOSIS — R079 Chest pain, unspecified: Secondary | ICD-10-CM | POA: Diagnosis not present

## 2023-04-18 DIAGNOSIS — S3991XA Unspecified injury of abdomen, initial encounter: Secondary | ICD-10-CM | POA: Diagnosis not present

## 2023-04-18 DIAGNOSIS — R220 Localized swelling, mass and lump, head: Secondary | ICD-10-CM | POA: Diagnosis not present

## 2023-04-18 DIAGNOSIS — I6782 Cerebral ischemia: Secondary | ICD-10-CM | POA: Diagnosis not present

## 2023-04-18 DIAGNOSIS — R739 Hyperglycemia, unspecified: Secondary | ICD-10-CM | POA: Diagnosis not present

## 2023-04-18 DIAGNOSIS — D649 Anemia, unspecified: Secondary | ICD-10-CM | POA: Diagnosis not present

## 2023-04-18 DIAGNOSIS — I517 Cardiomegaly: Secondary | ICD-10-CM | POA: Diagnosis not present

## 2023-04-18 DIAGNOSIS — N182 Chronic kidney disease, stage 2 (mild): Secondary | ICD-10-CM | POA: Diagnosis present

## 2023-04-18 DIAGNOSIS — I5032 Chronic diastolic (congestive) heart failure: Secondary | ICD-10-CM | POA: Diagnosis present

## 2023-04-18 DIAGNOSIS — E1122 Type 2 diabetes mellitus with diabetic chronic kidney disease: Secondary | ICD-10-CM | POA: Diagnosis present

## 2023-04-18 DIAGNOSIS — Z7984 Long term (current) use of oral hypoglycemic drugs: Secondary | ICD-10-CM | POA: Diagnosis not present

## 2023-04-18 DIAGNOSIS — R0602 Shortness of breath: Secondary | ICD-10-CM | POA: Diagnosis not present

## 2023-04-18 DIAGNOSIS — J441 Chronic obstructive pulmonary disease with (acute) exacerbation: Secondary | ICD-10-CM | POA: Diagnosis present

## 2023-04-18 DIAGNOSIS — N179 Acute kidney failure, unspecified: Secondary | ICD-10-CM | POA: Diagnosis present

## 2023-04-18 DIAGNOSIS — W108XXA Fall (on) (from) other stairs and steps, initial encounter: Secondary | ICD-10-CM | POA: Diagnosis not present

## 2023-04-18 DIAGNOSIS — S299XXA Unspecified injury of thorax, initial encounter: Secondary | ICD-10-CM | POA: Diagnosis not present

## 2023-04-18 DIAGNOSIS — M85811 Other specified disorders of bone density and structure, right shoulder: Secondary | ICD-10-CM | POA: Diagnosis not present

## 2023-04-18 DIAGNOSIS — N390 Urinary tract infection, site not specified: Secondary | ICD-10-CM | POA: Diagnosis present

## 2023-04-18 DIAGNOSIS — Y92008 Other place in unspecified non-institutional (private) residence as the place of occurrence of the external cause: Secondary | ICD-10-CM | POA: Diagnosis not present

## 2023-04-18 DIAGNOSIS — K219 Gastro-esophageal reflux disease without esophagitis: Secondary | ICD-10-CM | POA: Diagnosis not present

## 2023-04-18 DIAGNOSIS — Z043 Encounter for examination and observation following other accident: Secondary | ICD-10-CM | POA: Diagnosis not present

## 2023-04-18 DIAGNOSIS — R911 Solitary pulmonary nodule: Secondary | ICD-10-CM | POA: Diagnosis not present

## 2023-04-18 DIAGNOSIS — E785 Hyperlipidemia, unspecified: Secondary | ICD-10-CM | POA: Diagnosis present

## 2023-04-18 DIAGNOSIS — D518 Other vitamin B12 deficiency anemias: Secondary | ICD-10-CM | POA: Diagnosis not present

## 2023-04-18 DIAGNOSIS — S0990XA Unspecified injury of head, initial encounter: Secondary | ICD-10-CM | POA: Diagnosis not present

## 2023-04-18 DIAGNOSIS — S41112A Laceration without foreign body of left upper arm, initial encounter: Secondary | ICD-10-CM | POA: Diagnosis present

## 2023-04-18 DIAGNOSIS — R0902 Hypoxemia: Secondary | ICD-10-CM | POA: Diagnosis not present

## 2023-04-18 DIAGNOSIS — W19XXXA Unspecified fall, initial encounter: Secondary | ICD-10-CM | POA: Diagnosis not present

## 2023-04-18 DIAGNOSIS — E538 Deficiency of other specified B group vitamins: Secondary | ICD-10-CM | POA: Diagnosis present

## 2023-04-18 DIAGNOSIS — J9621 Acute and chronic respiratory failure with hypoxia: Secondary | ICD-10-CM | POA: Diagnosis present

## 2023-04-18 DIAGNOSIS — I700 Atherosclerosis of aorta: Secondary | ICD-10-CM | POA: Diagnosis not present

## 2023-04-18 DIAGNOSIS — R531 Weakness: Secondary | ICD-10-CM | POA: Diagnosis present

## 2023-04-18 DIAGNOSIS — S3993XA Unspecified injury of pelvis, initial encounter: Secondary | ICD-10-CM | POA: Diagnosis not present

## 2023-04-18 DIAGNOSIS — M6281 Muscle weakness (generalized): Secondary | ICD-10-CM | POA: Diagnosis not present

## 2023-04-18 DIAGNOSIS — M25511 Pain in right shoulder: Secondary | ICD-10-CM | POA: Diagnosis not present

## 2023-04-18 DIAGNOSIS — S199XXA Unspecified injury of neck, initial encounter: Secondary | ICD-10-CM | POA: Diagnosis not present

## 2023-04-18 LAB — CBC WITH DIFFERENTIAL/PLATELET
Abs Immature Granulocytes: 0.2 10*3/uL — ABNORMAL HIGH (ref 0.00–0.07)
Basophils Absolute: 0 10*3/uL (ref 0.0–0.1)
Basophils Relative: 0 %
Eosinophils Absolute: 0 10*3/uL (ref 0.0–0.5)
Eosinophils Relative: 0 %
HCT: 28 % — ABNORMAL LOW (ref 36.0–46.0)
Hemoglobin: 8.7 g/dL — ABNORMAL LOW (ref 12.0–15.0)
Immature Granulocytes: 1 %
Lymphocytes Relative: 2 %
Lymphs Abs: 0.3 10*3/uL — ABNORMAL LOW (ref 0.7–4.0)
MCH: 30.6 pg (ref 26.0–34.0)
MCHC: 31.1 g/dL (ref 30.0–36.0)
MCV: 98.6 fL (ref 80.0–100.0)
Monocytes Absolute: 0.9 10*3/uL (ref 0.1–1.0)
Monocytes Relative: 5 %
Neutro Abs: 16.3 10*3/uL — ABNORMAL HIGH (ref 1.7–7.7)
Neutrophils Relative %: 92 %
Platelets: 275 10*3/uL (ref 150–400)
RBC: 2.84 MIL/uL — ABNORMAL LOW (ref 3.87–5.11)
RDW: 12.7 % (ref 11.5–15.5)
WBC: 17.8 10*3/uL — ABNORMAL HIGH (ref 4.0–10.5)
nRBC: 0 % (ref 0.0–0.2)

## 2023-04-18 LAB — BASIC METABOLIC PANEL
Anion gap: 9 (ref 5–15)
BUN: 34 mg/dL — ABNORMAL HIGH (ref 8–23)
CO2: 32 mmol/L (ref 22–32)
Calcium: 8.9 mg/dL (ref 8.9–10.3)
Chloride: 100 mmol/L (ref 98–111)
Creatinine, Ser: 1.07 mg/dL — ABNORMAL HIGH (ref 0.44–1.00)
GFR, Estimated: 51 mL/min — ABNORMAL LOW (ref >60)
Glucose, Bld: 194 mg/dL — ABNORMAL HIGH (ref 70–99)
Potassium: 5 mmol/L (ref 3.5–5.1)
Sodium: 141 mmol/L (ref 135–145)

## 2023-04-18 LAB — I-STAT CHEM 8, ED
BUN: 34 mg/dL — ABNORMAL HIGH (ref 8–23)
Calcium, Ion: 1.14 mmol/L — ABNORMAL LOW (ref 1.15–1.40)
Chloride: 98 mmol/L (ref 98–111)
Creatinine, Ser: 1.1 mg/dL — ABNORMAL HIGH (ref 0.44–1.00)
Glucose, Bld: 194 mg/dL — ABNORMAL HIGH (ref 70–99)
HCT: 27 % — ABNORMAL LOW (ref 36.0–46.0)
Hemoglobin: 9.2 g/dL — ABNORMAL LOW (ref 12.0–15.0)
Potassium: 4.9 mmol/L (ref 3.5–5.1)
Sodium: 140 mmol/L (ref 135–145)
TCO2: 33 mmol/L — ABNORMAL HIGH (ref 22–32)

## 2023-04-18 LAB — URINALYSIS, W/ REFLEX TO CULTURE (INFECTION SUSPECTED)
Bilirubin Urine: NEGATIVE
Glucose, UA: NEGATIVE mg/dL
Ketones, ur: NEGATIVE mg/dL
Nitrite: POSITIVE — AB
Protein, ur: 100 mg/dL — AB
Specific Gravity, Urine: 1.016 (ref 1.005–1.030)
WBC, UA: >50 WBC/hpf (ref 0–5)
pH: 5 (ref 5.0–8.0)

## 2023-04-18 LAB — CBG MONITORING, ED: Glucose-Capillary: 163 mg/dL — ABNORMAL HIGH (ref 70–99)

## 2023-04-18 LAB — GLUCOSE, CAPILLARY: Glucose-Capillary: 152 mg/dL — ABNORMAL HIGH (ref 70–99)

## 2023-04-18 MED ORDER — FAMOTIDINE 20 MG PO TABS
20.0000 mg | ORAL_TABLET | Freq: Every day | ORAL | Status: DC
  Administered 2023-04-18 – 2023-04-21 (×4): 20 mg via ORAL
  Filled 2023-04-18 (×4): qty 1

## 2023-04-18 MED ORDER — ATORVASTATIN CALCIUM 10 MG PO TABS
20.0000 mg | ORAL_TABLET | Freq: Every day | ORAL | Status: DC
  Administered 2023-04-18 – 2023-04-21 (×4): 20 mg via ORAL
  Filled 2023-04-18 (×4): qty 2

## 2023-04-18 MED ORDER — SODIUM CHLORIDE 0.9 % IV SOLN
1.0000 g | INTRAVENOUS | Status: DC
  Administered 2023-04-19 – 2023-04-21 (×3): 1 g via INTRAVENOUS
  Filled 2023-04-18 (×4): qty 10

## 2023-04-18 MED ORDER — INSULIN ASPART 100 UNIT/ML IJ SOLN
0.0000 [IU] | Freq: Three times a day (TID) | INTRAMUSCULAR | Status: DC
  Administered 2023-04-19 (×3): 1 [IU] via SUBCUTANEOUS
  Administered 2023-04-20: 6 [IU] via SUBCUTANEOUS

## 2023-04-18 MED ORDER — SODIUM CHLORIDE 0.9 % IV SOLN
2.0000 g | Freq: Once | INTRAVENOUS | Status: AC
  Administered 2023-04-18: 2 g via INTRAVENOUS
  Filled 2023-04-18: qty 20

## 2023-04-18 MED ORDER — FENTANYL CITRATE PF 50 MCG/ML IJ SOSY
12.5000 ug | PREFILLED_SYRINGE | Freq: Once | INTRAMUSCULAR | Status: AC
  Administered 2023-04-18: 12.5 ug via INTRAVENOUS
  Filled 2023-04-18: qty 1

## 2023-04-18 MED ORDER — IOHEXOL 350 MG/ML SOLN
75.0000 mL | Freq: Once | INTRAVENOUS | Status: AC | PRN
  Administered 2023-04-18: 75 mL via INTRAVENOUS

## 2023-04-18 MED ORDER — OXYCODONE HCL 5 MG PO TABS: 5.0000 mg | ORAL_TABLET | Freq: Four times a day (QID) | ORAL | Status: DC | PRN

## 2023-04-18 MED ORDER — ALBUTEROL SULFATE HFA 108 (90 BASE) MCG/ACT IN AERS: 2.0000 | INHALATION_SPRAY | Freq: Four times a day (QID) | RESPIRATORY_TRACT | Status: DC | PRN

## 2023-04-18 MED ORDER — SODIUM CHLORIDE 0.9 % IV SOLN: INTRAVENOUS | Status: DC

## 2023-04-18 MED ORDER — ONDANSETRON HCL 4 MG/2ML IJ SOLN
4.0000 mg | Freq: Once | INTRAMUSCULAR | Status: AC
  Administered 2023-04-18: 4 mg via INTRAVENOUS
  Filled 2023-04-18: qty 2

## 2023-04-18 MED ORDER — ALBUTEROL SULFATE (2.5 MG/3ML) 0.083% IN NEBU: 2.5000 mg | INHALATION_SOLUTION | Freq: Four times a day (QID) | RESPIRATORY_TRACT | Status: DC | PRN

## 2023-04-18 MED ORDER — UMECLIDINIUM-VILANTEROL 62.5-25 MCG/ACT IN AEPB
1.0000 | INHALATION_SPRAY | Freq: Every day | RESPIRATORY_TRACT | Status: DC
  Administered 2023-04-19 – 2023-04-22 (×4): 1 via RESPIRATORY_TRACT
  Filled 2023-04-18 (×2): qty 14

## 2023-04-18 MED ORDER — TIZANIDINE HCL 4 MG PO TABS: 2.0000 mg | ORAL_TABLET | Freq: Every evening | ORAL | Status: DC | PRN

## 2023-04-18 MED ORDER — ENOXAPARIN SODIUM 40 MG/0.4ML IJ SOSY
40.0000 mg | PREFILLED_SYRINGE | INTRAMUSCULAR | Status: DC
  Administered 2023-04-18 – 2023-04-21 (×4): 40 mg via SUBCUTANEOUS
  Filled 2023-04-18 (×4): qty 0.4

## 2023-04-18 MED ORDER — SODIUM CHLORIDE 0.9 % IV BOLUS
500.0000 mL | Freq: Once | INTRAVENOUS | Status: AC
  Administered 2023-04-18: 500 mL via INTRAVENOUS

## 2023-04-18 MED ORDER — ACETAMINOPHEN 500 MG PO TABS: 1000.0000 mg | ORAL_TABLET | Freq: Four times a day (QID) | ORAL | Status: DC | PRN

## 2023-04-18 MED ORDER — MELATONIN 3 MG PO TABS
6.0000 mg | ORAL_TABLET | Freq: Every evening | ORAL | Status: DC | PRN
  Administered 2023-04-19: 6 mg via ORAL
  Filled 2023-04-18 (×3): qty 2

## 2023-04-18 MED ORDER — ALBUTEROL SULFATE (2.5 MG/3ML) 0.083% IN NEBU
5.0000 mg | INHALATION_SOLUTION | Freq: Once | RESPIRATORY_TRACT | Status: AC
  Administered 2023-04-18: 5 mg via RESPIRATORY_TRACT
  Filled 2023-04-18: qty 6

## 2023-04-18 MED ORDER — OXYCODONE HCL 5 MG PO TABS: 2.5000 mg | ORAL_TABLET | Freq: Four times a day (QID) | ORAL | Status: DC | PRN

## 2023-04-18 NOTE — H&P (Addendum)
History and Physical    Heidi Moran VZD:[id number] DOB: [birth year] DOA: 04/18/2023  PCP: Geoffry Paradise, MD   Patient coming from: Home   Chief Complaint:  Chief Complaint  Patient presents with   Trauma    HPI:  Heidi Moran is a 88 y.o. female with hx of COPD, CHF on 2 L O2, multiple pulmonary nodules, hypertension, hyperlipidemia, diabetes, CKD, RA, who was brought in after fall down 10-12 steps.  She reports bedroom is on the floor of her house and she has difficulty making it up all the steps.  Today we ended up to the last step but then became fatigued and there was no handhold at the very top.  She fell backwards down stairs and landed on her right side.  Pain mainly in the right shoulder and otherwise with skin tear in the left arm scattered bruises.  No syncopal episode.  Denies any headache, speech changes, vision changes, focal numbness or weakness.  noted to have mild weakness in the left side on exam although denies noting this symptom as does her husband.  Otherwise in past week she did fever, increased oral secretions.  No other URI symptoms, no change in chronic cough.  Denies dysuria or other urinary changes.  No other recent illness.   Review of Systems:  ROS complete and negative except as marked above   No Known Allergies  Prior to Admission medications   Medication Sig Start Date End Date Taking? Authorizing Provider  acetaminophen (TYLENOL) 325 MG tablet Take 2 tablets (650 mg total) by mouth every 6 (six) hours as needed for up to 30 doses for moderate pain or mild pain. 12/16/20   Terald Sleeper, MD  albuterol (VENTOLIN HFA) 108 (90 Base) MCG/ACT inhaler Inhale 2 puffs into the lungs every 6 (six) hours as needed for wheezing or shortness of breath. 10/07/22   Mannam, Praveen, MD  atorvastatin (LIPITOR) 20 MG tablet TAKE 1 TABLET orally once a day for 90 days    [provider]  Cholecalciferol (VITAMIN D3) 25 MCG (1000 UT) CAPS 1 capsule Orally  Once a day    [provider]  famotidine (PEPCID) 20 MG tablet TAKE 1 TABLET AT BEDTIME 10/07/22   Cobb, Ruby Cola, NP  glyBURIDE-metformin (GLUCOVANCE) 2.5-500 MG tablet Take 1 tablet by mouth daily with breakfast. Pt takes one in the morning with breakfast and one in the evening    [provider]  lisinopril (ZESTRIL) 10 MG tablet TAKE 1 TABLET orally once a day for 90 days    [provider]  OXYGEN Inhale 2 L/day into the lungs daily.    [provider]  umeclidinium-vilanterol (ANORO ELLIPTA) 62.5-25 MCG/ACT AEPB Inhale 1 puff into the lungs daily. 11/11/22   Chilton Greathouse, MD    Past Medical History:  Diagnosis Date   Anemia    Diabetes mellitus without complication (HCC)    Eczema    Hyperlipidemia    Hypertension    Osteoarthritis    Renal disorder    Rheumatoid arthritis (HCC)     History reviewed. No pertinent surgical history.   reports that she has never smoked. She has never used smokeless tobacco. She reports that she does not drink alcohol and does not use drugs.  Family History  Problem Relation Age of Onset   Heart disease Neg Hx      Physical Exam: Vitals:   04/18/23 1300 04/18/23 1633 04/18/23 1643 04/18/23 1937  BP: (!) 139/56  136/62  (!) 146/60  Pulse: 87 81 82 88  Resp: 19 (!) 21 15 17   Temp:   99.3 F (37.4 C) 98.1 F (36.7 C)  TempSrc:    Oral  SpO2: 100% 100% 100% 100%  Weight:      Height:        Gen: Awake, alert, elderly, frail.  HEENT: There is a abrasion/small hematoma at the right parietal scalp.  Significant posterior oropharyngeal secretions which she has difficulty clearing. CV: Regular, normal S1, S2, 1/6 sem Resp: Normal WOB, on 2 L, decreased air movement Abd: Round, normoactive, nontender MSK: Ranges extremities without pain Skin: See HEENT for more findings.  Skin tear over the left forearm, ecchymosis over the left hand.  Abrasion over the right shoulder.  Neuro: Alert and interactive,  fully oriented, speech is slightly garbled related to her secretions.  No aphasia.  CN II through XII are intact.  Motor is 4-5 in the left upper and lower extremity.  Otherwise 5 out of 5 throughout.  Sensation is intact and equal to fine touch. Psych: euthymic, speech sometimes tangential   Data review:   Labs reviewed, notable for:   Creatinine 1.1, baseline 0.8 WBC 17 Hemoglobin 8.7, borderline macrocytic  Micro:  Results for orders placed or performed during the hospital encounter of 01/14/20  Respiratory Panel by RT PCR (Flu A&B, Covid) - Nasopharyngeal Swab     Status: None   Collection Time: 01/14/20  8:04 PM   Specimen: Nasopharyngeal Swab  Result Value Ref Range Status   SARS Coronavirus 2 by RT PCR NEGATIVE NEGATIVE Final    Comment: (NOTE) SARS-CoV-2 target nucleic acids are NOT DETECTED.  The SARS-CoV-2 RNA is generally detectable in upper respiratoy specimens during the acute phase of infection. The lowest concentration of SARS-CoV-2 viral copies this assay can detect is 131 copies/mL. A negative result does not preclude SARS-Cov-2 infection and should not be used as the sole basis for treatment or other patient management decisions. A negative result may occur with  improper specimen collection/handling, submission of specimen other than nasopharyngeal swab, presence of viral mutation(s) within the areas targeted by this assay, and inadequate number of viral copies (<131 copies/mL). A negative result must be combined with clinical observations, patient history, and epidemiological information. The expected result is Negative.  Fact Sheet for Patients:  https://www.moore.com/  Fact Sheet for Healthcare Providers:  https://www.young.biz/  This test is no t yet approved or cleared by the [place] FDA and  has been authorized for detection and/or diagnosis of SARS-CoV-2 by FDA under an Emergency Use Authorization (EUA).  This EUA will remain  in effect (meaning this test can be used) for the duration of the COVID-19 declaration under Section 564(b)(1) of the Act, 21 U.S.C. section 360bbb-3(b)(1), unless the authorization is terminated or revoked sooner.     Influenza A by PCR NEGATIVE NEGATIVE Final   Influenza B by PCR NEGATIVE NEGATIVE Final    Comment: (NOTE) The Xpert Xpress SARS-CoV-2/FLU/RSV assay is intended as an aid in  the diagnosis of influenza from Nasopharyngeal swab specimens and  should not be used as a sole basis for treatment. Nasal washings and  aspirates are unacceptable for Xpert Xpress SARS-CoV-2/FLU/RSV  testing.  Fact Sheet for Patients: https://www.moore.com/  Fact Sheet for Healthcare Providers: https://www.young.biz/  This test is not yet approved or cleared by the [place] FDA and  has been authorized for detection and/or diagnosis of SARS-CoV-2 by  FDA under an Emergency Use  Authorization (EUA). This EUA will remain  in effect (meaning this test can be used) for the duration of the  Covid-19 declaration under Section 564(b)(1) of the Act, 21  U.S.C. section 360bbb-3(b)(1), unless the authorization is  terminated or revoked. Performed at [facility], [address], [place], [place] [address]     Imaging reviewed:  CT CHEST ABDOMEN PELVIS W CONTRAST Result Date: 04/18/2023 CLINICAL DATA:  Trauma EXAM: CT CHEST, ABDOMEN, AND PELVIS WITH CONTRAST TECHNIQUE: Multidetector CT imaging of the chest, abdomen and pelvis was performed following the standard protocol during bolus administration of intravenous contrast. RADIATION DOSE REDUCTION: This exam was performed according to the departmental dose-optimization program which includes automated exposure control, adjustment of the mA and/or kV according to patient size and/or use of iterative reconstruction technique. CONTRAST:  75mL OMNIPAQUE IOHEXOL 350 MG/ML SOLN COMPARISON:   11/18/2021, 06/23/2020 FINDINGS: CT CHEST FINDINGS Cardiovascular: Heart size is normal without pericardial effusion. The thoracic aorta is normal in course and caliber without dissection, aneurysm, ulceration or intramural hematoma. Mediastinum/Nodes: No mediastinal hematoma. No mediastinal, hilar or axillary lymphadenopathy. The visualized thyroid and thoracic esophageal course are unremarkable. Lungs/Pleura: Emphysema. There are numerous bilateral pulmonary nodules. The largest is in the posterior right middle lobe and measures 12 mm (5:85). The size and distribution of the nodules are unchanged. There is no pleural effusion or pneumothorax. Musculoskeletal: No acute fracture of the ribs, sternum or the visible portions of clavicles and scapulae. CT ABDOMEN PELVIS FINDINGS Hepatobiliary: No hepatic hematoma or laceration. No biliary dilatation. Large stone within the gallbladder. No inflammatory change. Pancreas: Unchanged 6 x 8 mm cystic focus within the pancreatic body. No inflammatory change. Spleen: No splenic laceration or hematoma. Adrenals/Urinary Tract: --Adrenal glands: No adrenal hemorrhage. --Right kidney/ureter: No hydronephrosis or perinephric hematoma. --Left kidney/ureter: Unchanged 2 cm left lower pole Bosniak type 1 simple renal cyst. --Urinary bladder: Unremarkable. Stomach/Bowel: --Stomach/Duodenum: No hiatal hernia or other gastric abnormality. Normal duodenal course and caliber. --Small bowel: No dilatation or inflammation. --Colon: Rectosigmoid diverticulosis without acute inflammation. --Appendix: Not visualized. No right lower quadrant inflammation or free fluid. Vascular/Lymphatic: Calcific aortic atherosclerosis. No abdominal lymphadenopathy. Reproductive: Status post hysterectomy. No adnexal mass. Musculoskeletal. Multilevel chronic vertebral body height loss. No acute fracture. Other: None. IMPRESSION: 1. No acute traumatic injury to the chest, abdomen or pelvis. 2. Unchanged numerous  bilateral pulmonary nodules, the largest measuring 12 mm in the right middle lobe. 3. Cholelithiasis. 4. Unchanged 6 x 8 mm cystic focus within the pancreatic body. Electronically Signed   By: Deatra Robinson M.D.   On: 04/18/2023 19:13   CT Head Wo Contrast Result Date: 04/18/2023 CLINICAL DATA:  Head trauma, minor (Age >= 65y); Neck trauma (Age >= 65y). Fall. EXAM: CT HEAD WITHOUT CONTRAST CT CERVICAL SPINE WITHOUT CONTRAST TECHNIQUE: Multidetector CT imaging of the head and cervical spine was performed following the standard protocol without intravenous contrast. Multiplanar CT image reconstructions of the cervical spine were also generated. RADIATION DOSE REDUCTION: This exam was performed according to the departmental dose-optimization program which includes automated exposure control, adjustment of the mA and/or kV according to patient size and/or use of iterative reconstruction technique. COMPARISON:  None Available. FINDINGS: CT HEAD FINDINGS Brain: There is no evidence of an acute infarct, intracranial hemorrhage, mass, midline shift, or extra-axial fluid collection. Cerebral volume is within normal limits for age. The ventricles are normal in size. Patchy cerebral white matter hypodensities are nonspecific but compatible with moderate chronic small vessel ischemic disease.  Vascular: Calcified atherosclerosis at the skull base. No hyperdense vessel. Skull: No acute fracture or suspicious osseous lesion. Sinuses/Orbits: No significant inflammatory changes in the paranasal sinuses. Clear mastoid air cells. Bilateral cataract extraction. Other: Mild right parietal scalp soft tissue swelling. CT CERVICAL SPINE FINDINGS Alignment: Normal. Skull base and vertebrae: No acute fracture or suspicious osseous lesion. Soft tissues and spinal canal: No prevertebral fluid or swelling. No visible canal hematoma. Disc levels: Mild cervical spondylosis, less than is typically seen for age. Upper chest: Biapical  pleuroparenchymal lung scarring. Other: None. IMPRESSION: 1. No evidence of acute intracranial abnormality or cervical spine fracture. 2. Moderate chronic small vessel ischemic disease. Electronically Signed   By: Sebastian Ache M.D.   On: 04/18/2023 13:52   CT Cervical Spine Wo Contrast Result Date: 04/18/2023 CLINICAL DATA:  Head trauma, minor (Age >= 65y); Neck trauma (Age >= 65y). Fall. EXAM: CT HEAD WITHOUT CONTRAST CT CERVICAL SPINE WITHOUT CONTRAST TECHNIQUE: Multidetector CT imaging of the head and cervical spine was performed following the standard protocol without intravenous contrast. Multiplanar CT image reconstructions of the cervical spine were also generated. RADIATION DOSE REDUCTION: This exam was performed according to the departmental dose-optimization program which includes automated exposure control, adjustment of the mA and/or kV according to patient size and/or use of iterative reconstruction technique. COMPARISON:  None Available. FINDINGS: CT HEAD FINDINGS Brain: There is no evidence of an acute infarct, intracranial hemorrhage, mass, midline shift, or extra-axial fluid collection. Cerebral volume is within normal limits for age. The ventricles are normal in size. Patchy cerebral white matter hypodensities are nonspecific but compatible with moderate chronic small vessel ischemic disease. Vascular: Calcified atherosclerosis at the skull base. No hyperdense vessel. Skull: No acute fracture or suspicious osseous lesion. Sinuses/Orbits: No significant inflammatory changes in the paranasal sinuses. Clear mastoid air cells. Bilateral cataract extraction. Other: Mild right parietal scalp soft tissue swelling. CT CERVICAL SPINE FINDINGS Alignment: Normal. Skull base and vertebrae: No acute fracture or suspicious osseous lesion. Soft tissues and spinal canal: No prevertebral fluid or swelling. No visible canal hematoma. Disc levels: Mild cervical spondylosis, less than is typically seen for age.  Upper chest: Biapical pleuroparenchymal lung scarring. Other: None. IMPRESSION: 1. No evidence of acute intracranial abnormality or cervical spine fracture. 2. Moderate chronic small vessel ischemic disease. Electronically Signed   By: Sebastian Ache M.D.   On: 04/18/2023 13:52   DG Chest Port 1 View Result Date: 04/18/2023 CLINICAL DATA:  Pain after fall. EXAM: PORTABLE CHEST 1 VIEW COMPARISON:  X-ray 12/16/2020. FINDINGS: Hyperinflation. No consolidation, pneumothorax or effusion. No edema. Enlarged cardiopericardial silhouette. Tortuous aorta. Overlapping cardiac leads. If there is further concern of the sequela trauma, a CT with contrast could be considered as clinically appropriate. Film is slightly rotated to the right. IMPRESSION: Hyperinflation with chronic changes.  Slightly enlarged heart. Electronically Signed   By: Karen Kays M.D.   On: 04/18/2023 13:35   DG Shoulder Right Port Result Date: 04/18/2023 CLINICAL DATA:  Trauma.  Fall.  Pain EXAM: RIGHT SHOULDER - 3 VIEW portable COMPARISON:  X-ray 12/16/2020 FINDINGS: Severe osteopenia. No fracture or dislocation. Preserved joint spaces. Old right-sided rib fractures incidentally noted. IMPRESSION: Severe osteopenia.  No acute osseous abnormality. Electronically Signed   By: Karen Kays M.D.   On: 04/18/2023 13:34   DG Pelvis Portable Result Date: 04/18/2023 CLINICAL DATA:  Fall EXAM: PORTABLE PELVIS 1-2 VIEWS COMPARISON:  09/14/2021- FINDINGS: There is no evidence of pelvic fracture or diastasis. No pelvic bone lesions  are seen. IMPRESSION: Negative. Electronically Signed   By: Duanne Guess D.O.   On: 04/18/2023 13:34    EKG:  Personally reviewed Sinus rhythm, ST depression and T wave version lead III more pronounced than prior  ED Course:  Arrived as a trauma alert.  Trauma imaging without significant injury.  Finding of UTI, started on ceftriaxone.  Otherwise treated with fentanyl, albuterol.   Assessment/Plan:  88 y.o. female  with hx COPD, CHRF on 2 L O2, multiple pulmonary nodules, hypertension, hyperlipidemia, diabetes, CKD, RA, who was brought in after fall down 10-12 steps with no traumatic injuries identified.  Found have UTI.  Fall down 10-12 steps, mechanical Arrived as trauma alert.  Imaging including CT head, C-spine, chest abdomen pelvis, x-ray right shoulder, pelvis, chest x-ray with no traumatic injuries.  - PT evaluation - Symptomatic management Tylenol as needed for mild, tizanidine 2 mg nightly prn for spasm, oxycodone 2.5/5 mg for moderate/severe.  Uncomplicated UTI - Continued ceftriaxone 1 g IV every 24 hours - Follow urine culture  Borderline AKI stage I CKD stage II Baseline creatinine approximately 0.88, elevated 1.1 on admission.  Reports decreased oral intake over the past week.  Suspect prerenal. - NS 500 cc then continue 100 cc/h x 10 hours then reassess. -Hold home lisinopril  Left upper extremity/lower extremity weakness Noted on exam, patient and husband had not noticed any unilateral weakness.  No other neurologic symptoms no other focal deficits.  Unclear if this is effort related or limited by pain after fall. - Continue neurochecks, notify provider if any change in neurologic exam. - With her increased oral secretions check swallow screen before giving diet  Chronic medical problems: COPD, CHRFon 2 L O2; multiple pulmonary nodules: Followed by pulmonology outpatient.  Continue home Anoro Ellipta, albuterol as needed.  Outpatient surveillance for pulmonary nodules. Hypertension: Hold her lisinopril in setting of borderline AKI Hyperlipidemia: Continue home atorvastatin Diabetes type 2: Hold her home glyburide-metformin.  SSI while inpatient. Rheumatoid arthritis: Not on DMARDs, OP f/u  Chronic anemia: check B12, folate   Body mass index is 20.66 kg/m.    DVT prophylaxis:  Lovenox Code Status:  DNR/DNI(Do NOT Intubate) Diet:  Diet Orders (From admission, onward)      Start     Ordered   04/18/23 1959  Diet regular Room service appropriate? Yes; Fluid consistency: Thin  Diet effective now       Question Answer Comment  Room service appropriate? Yes   Fluid consistency: Thin      04/18/23 1959           Family Communication:  Yes discussed with husband at bedside   Consults:  None   Admission status:   Observation, Med-Surg  Severity of Illness: The appropriate patient status for this patient is OBSERVATION. Observation status is judged to be reasonable and necessary in order to provide the required intensity of service to ensure the patient's safety. The patient's presenting symptoms, physical exam findings, and initial radiographic and laboratory data in the context of their medical condition is felt to place them at decreased risk for further clinical deterioration. Furthermore, it is anticipated that the patient will be medically stable for discharge from the hospital within 2 midnights of admission.    Dolly Rias, MD [facility]  How to contact the [facility] Attending or Consulting provider 7A - 7P or covering provider during after hours 7P -7A, for this patient.  Check the care team in [facility] and look for a) attending/consulting  TR[facility] provider listed and b) the [facility] team listed Log into [email or web address] and use [facility]'s universal password to access. If you do not have the password, please contact the hospital operator. Locate the [facility] provider you are looking for under [facility] and page to a number that you can be directly reached. If you still have difficulty reaching the provider, please page the [facility] (Director on Call) for the Hospitalists listed on amion for assistance.  04/18/2023, 8:34 PM

## 2023-04-18 NOTE — ED Provider Notes (Signed)
[facility] AT [facility] Provider Note   CSN: [id number] Arrival date & time: 04/18/23  1203     History {Add pertinent medical, surgical, social history, OB history to HPI:1} No chief complaint on file.   JADELYNN BOYLAN is a 88 y.o. female with a past medical history of type 2 diabetes, chronic respiratory failure with hypoxia on 2 L, hypertension, renal insufficiency, RA and osteoarthritis.  Patient presents as a level 2 trauma due to blood pressure and respiratory rate.  She is not on any blood thinners.  Patient states that she got to the very top of her staircase and lost her footing and fell down approximately 12 stairs. He is otherwise requiring her baseline liters via nasal cannula.  She denies loss of consciousness.  EMS reports that he has been alert and oriented entire process.  She did not require any pain medicine HPI     Home Medications Prior to Admission medications   Medication Sig Start Date End Date Taking? Authorizing Provider  acetaminophen (TYLENOL) 325 MG tablet Take 2 tablets (650 mg total) by mouth every 6 (six) hours as needed for up to 30 doses for moderate pain or mild pain. 12/16/20   Terald Sleeper, MD  albuterol (VENTOLIN HFA) 108 (90 Base) MCG/ACT inhaler Inhale 2 puffs into the lungs every 6 (six) hours as needed for wheezing or shortness of breath. 10/07/22   Mannam, Praveen, MD  atorvastatin (LIPITOR) 20 MG tablet TAKE 1 TABLET orally once a day for 90 days    [provider]  Cholecalciferol (VITAMIN D3) 25 MCG (1000 UT) CAPS 1 capsule Orally Once a day    [provider]  famotidine (PEPCID) 20 MG tablet TAKE 1 TABLET AT BEDTIME 10/07/22   Cobb, Ruby Cola, NP  glyBURIDE-metformin (GLUCOVANCE) 2.5-500 MG tablet Take 1 tablet by mouth daily with breakfast. Pt takes one in the morning with breakfast and one in the evening    [provider]  lisinopril (ZESTRIL) 10 MG tablet TAKE 1 TABLET orally  once a day for 90 days    [provider]  OXYGEN Inhale 2 L/day into the lungs daily.    [provider]  umeclidinium-vilanterol (ANORO ELLIPTA) 62.5-25 MCG/ACT AEPB Inhale 1 puff into the lungs daily. 11/11/22   Chilton Greathouse, MD      Allergies    Patient has no known allergies.    Review of Systems   Review of Systems  Physical Exam Updated Vital Signs There were no vitals taken for this visit. Physical Exam Vitals and nursing note reviewed.  Constitutional:      General: She is not in acute distress.    Appearance: She is well-developed. She is not diaphoretic.  HENT:     Head: Normocephalic and atraumatic.     Right Ear: External ear normal.     Left Ear: External ear normal.     Nose: Nose normal.     Mouth/Throat:     Mouth: Mucous membranes are moist.  Eyes:     General: No scleral icterus.    Conjunctiva/sclera: Conjunctivae normal.  Neck:     Comments: C-collar are in place Cardiovascular:     Rate and Rhythm: Regular rhythm. Tachycardia present.     Heart sounds: Normal heart sounds. No murmur heard.    No friction rub. No gallop.  Pulmonary:     Effort: Tachypnea present.     Breath sounds: Rhonchi present.  Abdominal:  General: Bowel sounds are normal. There is no distension.     Palpations: Abdomen is soft. There is no mass.     Tenderness: There is no abdominal tenderness. There is no guarding.  Musculoskeletal:     Cervical back: Normal range of motion.  Skin:    General: Skin is warm and dry.  Neurological:     Mental Status: She is alert and oriented to person, place, and time.  Psychiatric:        Behavior: Behavior normal.     ED Results / Procedures / Treatments   Labs (all labs ordered are listed, but only abnormal results are displayed) Labs Reviewed - No data to display  EKG None  Radiology No results found.  Procedures Procedures  {Document cardiac monitor, telemetry assessment procedure when  appropriate:1}  Medications Ordered in ED Medications - No data to display  ED Course/ Medical Decision Making/ A&P   {   Click here for ABCD2, HEART and other calculatorsREFRESH Note before signing :1}                              Medical Decision Making Amount and/or Complexity of Data Reviewed Radiology: ordered.   This patient presents to the ED with chief complaint(s) of Trauma with pertinent past medical history of dm, osteoporosis, Chronic resp failure which further complicates the presenting complaint. The complaint involves an extensive differential diagnosis and treatment options and also carries with it a high risk of complications and morbidity.    The differential diagnosis includes ***   The initial plan is to order *** and to treat patient with *** for ***  Additional Tests and treatment considered: Patient is low risk / negative by ***, therefore do not feel that *** is indicated.  Additional history obtained: Additional history obtained from {additional history:26846} Records reviewed {records:26847}  Reassessment and review (also see workup area): Lab Tests: I Ordered, and personally interpreted labs.  The pertinent results include:  ***  Imaging Studies: I ordered and independently visualized and interpreted the following imaging {imaging:26848}  which showed *** The interpretation of the imaging was limited to assessing for emergent pathology, for which purpose it was ordered.  Consultations Obtained: I requested consultation with the {consultation:26851}, and discussed  findings as well as pertinent plan - they recommend: ***  Medicines ordered and prescription drug management: I ordered the following medications *** for ***  I considered this additional medications: ***   Reevaluation of the patient after these medicines showed that the patient    {resolved/improved/worsened:23923::"improved"}  Social Determinants of Health: Patient's {ZOXW:96045}   increases the complexity of managing their presentation  Cardiac Monitoring: The patient was maintained on a cardiac monitor.  I personally viewed and interpreted the cardiac monitor which showed an underlying rhythm of:  {cardiac monitor:26849}  Complexity of problems addressed: Patient's presentation is most consistent with  {COPA:26843} During patient's assessment  Disposition: After consideration of the diagnostic results and the patient's response to treatment,  I feel that the patent would benefit from {disposition:26850}.    {Document critical care time when appropriate:1} {Document review of labs and clinical decision tools ie heart score, Chads2Vasc2 etc:1}  {Document your independent review of radiology images, and any outside records:1} {Document your discussion with family members, caretakers, and with consultants:1} {Document social determinants of health affecting pt's care:1} {Document your decision making why or why not admission, treatments were needed:1} Final Clinical Impression(s) /  ED Diagnoses Final diagnoses:  None    Rx / DC Orders ED Discharge Orders     None

## 2023-04-18 NOTE — Progress Notes (Signed)
Pt. Fail down step.  Pt. Gone to CT.  Chaplain provided support to Patient and Staff.  Chaplain available as needed.   Venida Jarvis, [place], [facility], Pager [phone]

## 2023-04-18 NOTE — ED Triage Notes (Signed)
Patient present to ed via [facility] from home states she walked up 10-12 steps was tired after getting up the steps and fell backwards c/o right shoulder pain small abrasion to top of right shoulder, patient states she is on home 02 all the time. Patient is alert oriented.

## 2023-04-18 NOTE — ED Provider Notes (Incomplete)
Medical Decision Making: Care of patient assumed from Dr. Marland Kitchen at 8:04 PM.  Agree with history.  See their note for further details.  Briefly, 88 y.o. female with PMH/PSH as below.  Past Medical History:  Diagnosis Date   Anemia    Diabetes mellitus without complication (HCC)    Eczema    Hyperlipidemia    Hypertension    Osteoarthritis    Renal disorder    Rheumatoid arthritis (HCC)    History reviewed. No pertinent surgical history.    Patient presents with ***  Current plan is as follows:  Clinical Course as of 04/18/23 2004  Fri Apr 18, 2023  1514 S- L2T after fall down stairs No AC Tachypneic, tachy on arrival Obstructive lung disesase from known mass on O2 at baseline Trauma scans negative acutely Primarily weak, R shoulder pain Fentanyl trial [ ]  CT if fails fent trial [GG]    Clinical Course User Index [GG] Mikeal Hawthorne, MD    MDM:  -Reviewed and confirmed nursing documentation for past medical history, family history, social history. -Vital signs stable.*** -Upon reevaluation, patient ***.   -Patient had no acute events while under my care in the emergency department. ***    The plan for this patient was discussed with Dr. ***, who voiced agreement and who oversaw evaluation and treatment of this patient.  Mikeal Hawthorne, MD Emergency Medicine, PGY-2  Note: Dragon medical dictation software was used in the creation of this note.   @DISPOSITION @

## 2023-04-19 ENCOUNTER — Observation Stay (HOSPITAL_COMMUNITY): Payer: Medicare Other

## 2023-04-19 DIAGNOSIS — S41112A Laceration without foreign body of left upper arm, initial encounter: Secondary | ICD-10-CM | POA: Diagnosis present

## 2023-04-19 DIAGNOSIS — W108XXA Fall (on) (from) other stairs and steps, initial encounter: Secondary | ICD-10-CM | POA: Diagnosis not present

## 2023-04-19 DIAGNOSIS — I130 Hypertensive heart and chronic kidney disease with heart failure and stage 1 through stage 4 chronic kidney disease, or unspecified chronic kidney disease: Secondary | ICD-10-CM | POA: Diagnosis present

## 2023-04-19 DIAGNOSIS — J9601 Acute respiratory failure with hypoxia: Secondary | ICD-10-CM | POA: Diagnosis not present

## 2023-04-19 DIAGNOSIS — D649 Anemia, unspecified: Secondary | ICD-10-CM | POA: Diagnosis not present

## 2023-04-19 DIAGNOSIS — M069 Rheumatoid arthritis, unspecified: Secondary | ICD-10-CM | POA: Diagnosis present

## 2023-04-19 DIAGNOSIS — R911 Solitary pulmonary nodule: Secondary | ICD-10-CM | POA: Diagnosis not present

## 2023-04-19 DIAGNOSIS — J9621 Acute and chronic respiratory failure with hypoxia: Secondary | ICD-10-CM | POA: Diagnosis present

## 2023-04-19 DIAGNOSIS — N182 Chronic kidney disease, stage 2 (mild): Secondary | ICD-10-CM | POA: Diagnosis present

## 2023-04-19 DIAGNOSIS — R2689 Other abnormalities of gait and mobility: Secondary | ICD-10-CM | POA: Diagnosis not present

## 2023-04-19 DIAGNOSIS — E785 Hyperlipidemia, unspecified: Secondary | ICD-10-CM | POA: Diagnosis present

## 2023-04-19 DIAGNOSIS — M6281 Muscle weakness (generalized): Secondary | ICD-10-CM | POA: Diagnosis not present

## 2023-04-19 DIAGNOSIS — Y92008 Other place in unspecified non-institutional (private) residence as the place of occurrence of the external cause: Secondary | ICD-10-CM | POA: Diagnosis not present

## 2023-04-19 DIAGNOSIS — K219 Gastro-esophageal reflux disease without esophagitis: Secondary | ICD-10-CM | POA: Diagnosis not present

## 2023-04-19 DIAGNOSIS — J4489 Other specified chronic obstructive pulmonary disease: Secondary | ICD-10-CM | POA: Diagnosis not present

## 2023-04-19 DIAGNOSIS — E1165 Type 2 diabetes mellitus with hyperglycemia: Secondary | ICD-10-CM | POA: Diagnosis present

## 2023-04-19 DIAGNOSIS — Z66 Do not resuscitate: Secondary | ICD-10-CM | POA: Diagnosis present

## 2023-04-19 DIAGNOSIS — D518 Other vitamin B12 deficiency anemias: Secondary | ICD-10-CM | POA: Diagnosis not present

## 2023-04-19 DIAGNOSIS — J441 Chronic obstructive pulmonary disease with (acute) exacerbation: Secondary | ICD-10-CM | POA: Diagnosis present

## 2023-04-19 DIAGNOSIS — Z7401 Bed confinement status: Secondary | ICD-10-CM | POA: Diagnosis not present

## 2023-04-19 DIAGNOSIS — Z9981 Dependence on supplemental oxygen: Secondary | ICD-10-CM | POA: Diagnosis not present

## 2023-04-19 DIAGNOSIS — E1122 Type 2 diabetes mellitus with diabetic chronic kidney disease: Secondary | ICD-10-CM | POA: Diagnosis present

## 2023-04-19 DIAGNOSIS — R0902 Hypoxemia: Secondary | ICD-10-CM | POA: Diagnosis not present

## 2023-04-19 DIAGNOSIS — E538 Deficiency of other specified B group vitamins: Secondary | ICD-10-CM | POA: Diagnosis present

## 2023-04-19 DIAGNOSIS — R739 Hyperglycemia, unspecified: Secondary | ICD-10-CM | POA: Diagnosis not present

## 2023-04-19 DIAGNOSIS — N390 Urinary tract infection, site not specified: Secondary | ICD-10-CM | POA: Diagnosis present

## 2023-04-19 DIAGNOSIS — Z7984 Long term (current) use of oral hypoglycemic drugs: Secondary | ICD-10-CM | POA: Diagnosis not present

## 2023-04-19 DIAGNOSIS — Z794 Long term (current) use of insulin: Secondary | ICD-10-CM | POA: Diagnosis not present

## 2023-04-19 DIAGNOSIS — J449 Chronic obstructive pulmonary disease, unspecified: Secondary | ICD-10-CM | POA: Diagnosis not present

## 2023-04-19 DIAGNOSIS — I10 Essential (primary) hypertension: Secondary | ICD-10-CM | POA: Diagnosis not present

## 2023-04-19 DIAGNOSIS — I5032 Chronic diastolic (congestive) heart failure: Secondary | ICD-10-CM | POA: Diagnosis present

## 2023-04-19 DIAGNOSIS — R531 Weakness: Secondary | ICD-10-CM | POA: Diagnosis present

## 2023-04-19 DIAGNOSIS — I700 Atherosclerosis of aorta: Secondary | ICD-10-CM | POA: Diagnosis not present

## 2023-04-19 DIAGNOSIS — R0602 Shortness of breath: Secondary | ICD-10-CM | POA: Diagnosis not present

## 2023-04-19 DIAGNOSIS — N179 Acute kidney failure, unspecified: Secondary | ICD-10-CM | POA: Diagnosis present

## 2023-04-19 LAB — BLOOD GAS, VENOUS
Acid-Base Excess: 9.4 mmol/L — ABNORMAL HIGH (ref 0.0–2.0)
Bicarbonate: 38.8 mmol/L — ABNORMAL HIGH (ref 20.0–28.0)
Drawn by: 42628
O2 Saturation: 57.6 %
Patient temperature: 36.4
pCO2, Ven: 75 mm[Hg] (ref 44–60)
pH, Ven: 7.32 (ref 7.25–7.43)
pO2, Ven: 32 mm[Hg] (ref 32–45)

## 2023-04-19 LAB — BASIC METABOLIC PANEL
Anion gap: 13 (ref 5–15)
BUN: 25 mg/dL — ABNORMAL HIGH (ref 8–23)
CO2: 31 mmol/L (ref 22–32)
Calcium: 8.9 mg/dL (ref 8.9–10.3)
Chloride: 100 mmol/L (ref 98–111)
Creatinine, Ser: 0.93 mg/dL (ref 0.44–1.00)
GFR, Estimated: >60 mL/min (ref >60)
Glucose, Bld: 175 mg/dL — ABNORMAL HIGH (ref 70–99)
Potassium: 5.1 mmol/L (ref 3.5–5.1)
Sodium: 144 mmol/L (ref 135–145)

## 2023-04-19 LAB — PHOSPHORUS: Phosphorus: 3.8 mg/dL (ref 2.5–4.6)

## 2023-04-19 LAB — RESPIRATORY PANEL BY PCR

## 2023-04-19 LAB — CBC
HCT: 29.3 % — ABNORMAL LOW (ref 36.0–46.0)
Hemoglobin: 8.8 g/dL — ABNORMAL LOW (ref 12.0–15.0)
MCH: 29.8 pg (ref 26.0–34.0)
MCHC: 30 g/dL (ref 30.0–36.0)
MCV: 99.3 fL (ref 80.0–100.0)
Platelets: 283 10*3/uL (ref 150–400)
RBC: 2.95 MIL/uL — ABNORMAL LOW (ref 3.87–5.11)
RDW: 12.8 % (ref 11.5–15.5)
WBC: 16.4 10*3/uL — ABNORMAL HIGH (ref 4.0–10.5)
nRBC: 0 % (ref 0.0–0.2)

## 2023-04-19 LAB — GLUCOSE, CAPILLARY
Glucose-Capillary: 173 mg/dL — ABNORMAL HIGH (ref 70–99)
Glucose-Capillary: 161 mg/dL — ABNORMAL HIGH (ref 70–99)
Glucose-Capillary: 188 mg/dL — ABNORMAL HIGH (ref 70–99)
Glucose-Capillary: 220 mg/dL — ABNORMAL HIGH (ref 70–99)

## 2023-04-19 LAB — MAGNESIUM: Magnesium: 1.6 mg/dL — ABNORMAL LOW (ref 1.7–2.4)

## 2023-04-19 LAB — FOLATE: Folate: 12.4 ng/mL (ref >5.9)

## 2023-04-19 LAB — HEMOGLOBIN A1C
Hgb A1c MFr Bld: 6.1 % — ABNORMAL HIGH (ref 4.8–5.6)
Mean Plasma Glucose: 128.37 mg/dL

## 2023-04-19 LAB — VITAMIN B12: Vitamin B-12: 135 pg/mL — ABNORMAL LOW (ref 180–914)

## 2023-04-19 LAB — SARS CORONAVIRUS 2 BY RT PCR: SARS Coronavirus 2 by RT PCR: NEGATIVE

## 2023-04-19 LAB — PROCALCITONIN: Procalcitonin: 1.64 ng/mL

## 2023-04-19 MED ORDER — SODIUM CHLORIDE 0.9 % IV SOLN
500.0000 mg | INTRAVENOUS | Status: DC
  Administered 2023-04-20 – 2023-04-22 (×3): 500 mg via INTRAVENOUS
  Filled 2023-04-19 (×3): qty 5

## 2023-04-19 MED ORDER — IPRATROPIUM-ALBUTEROL 0.5-2.5 (3) MG/3ML IN SOLN
3.0000 mL | RESPIRATORY_TRACT | Status: DC | PRN
  Administered 2023-04-19 – 2023-04-22 (×4): 3 mL via RESPIRATORY_TRACT
  Filled 2023-04-19 (×4): qty 3

## 2023-04-19 MED ORDER — MAGNESIUM SULFATE 2 GM/50ML IV SOLN
2.0000 g | Freq: Once | INTRAVENOUS | Status: AC
  Administered 2023-04-19: 2 g via INTRAVENOUS
  Filled 2023-04-19: qty 50

## 2023-04-19 MED ORDER — LABETALOL HCL 5 MG/ML IV SOLN: 5.0000 mg | INTRAVENOUS | Status: DC | PRN

## 2023-04-19 MED ORDER — HYDRALAZINE HCL 20 MG/ML IJ SOLN: 10.0000 mg | Freq: Three times a day (TID) | INTRAMUSCULAR | Status: DC | PRN

## 2023-04-19 MED ORDER — LABETALOL HCL 5 MG/ML IV SOLN: 10.0000 mg | INTRAVENOUS | Status: DC | PRN

## 2023-04-19 MED ORDER — CYANOCOBALAMIN 1000 MCG/ML IJ SOLN
1000.0000 ug | Freq: Once | INTRAMUSCULAR | Status: AC
  Administered 2023-04-20: 1000 ug via INTRAMUSCULAR
  Filled 2023-04-19: qty 1

## 2023-04-19 MED ORDER — ACETYLCYSTEINE 20 % IN SOLN
3.0000 mL | Freq: Three times a day (TID) | RESPIRATORY_TRACT | Status: DC
  Administered 2023-04-19 – 2023-04-20 (×4): 3 mL via ORAL
  Filled 2023-04-19 (×7): qty 4

## 2023-04-19 MED ORDER — METHYLPREDNISOLONE SODIUM SUCC 125 MG IJ SOLR
125.0000 mg | Freq: Once | INTRAMUSCULAR | Status: AC
  Administered 2023-04-19: 125 mg via INTRAVENOUS
  Filled 2023-04-19: qty 2

## 2023-04-19 MED ORDER — METHYLPREDNISOLONE SODIUM SUCC 40 MG IJ SOLR
40.0000 mg | Freq: Two times a day (BID) | INTRAMUSCULAR | Status: DC
  Administered 2023-04-19 – 2023-04-22 (×6): 40 mg via INTRAVENOUS
  Filled 2023-04-19 (×6): qty 1

## 2023-04-19 NOTE — Progress Notes (Addendum)
Progress Note   Patient: Heidi Moran GEX:[id number] DOB: 12-Feb-1936 DOA: 04/18/2023     0 DOS: the patient was seen and examined on 04/19/2023    Assessment and Plan: Acute on chronic hypoxemic/hypercapnic RF H/o COPD Leukocytosis  This AM O2 sats dropped to 70s. Patient eventually required BIPAP. Able to be transitioned back to HFNC. She wears 2L Platea at home. Unclear what precipitated her acute change in respiratory status. Afebrile. CXR without consolidation or other acute finding. VBG with PCO2 with elevated bicarb. Leukocytosis to 16.4 neutrophilic predominant with lymphopenia as well. Procal elevated. RPP negative.  Continue with ctx and azithromycin for now Continue steroids  BiPAP at night  and as needed  Continue home inhalers  Maintain O2 sats 88% Trend WBC count    Mechanical fall  No evidence of fracture. Has some soreness at right shoulder.  -Continue pain management -PT OT  Chronic normocytic anemia  Stable.  B12 decreased Will replete   Elevated serum creatine  Improving   Hypomagnesemia  Replete  ?UTI  Patient without symptoms, UA concerning for infection.  Antibiotics per above will cover   Multiple pulmonary nodules Nothing to due inpateint  RA not on DMARD Outpatient followup      Subjective: Speech is somewhat difficult to understand. Spouse and daughter at bedside. She has no pain and no trouble breathing.   Physical Exam: Vitals:   04/19/23 1355 04/19/23 2013 04/19/23 2034 04/19/23 2037  BP: (!) 141/64  (!) 173/68 (!) 167/72  Pulse: 83 74 76 79  Resp: (!) 24  20 18   Temp:   98.2 F (36.8 C) 98.2 F (36.8 C)  TempSrc:   Oral Oral  SpO2: 92% 94% 93% 95%  Weight:      Height:       Physical Exam  Constitutional: In no distress. Chronically ill appearing. Thin  Cardiovascular: Normal rate, regular rhythm. No lower extremity edema  Pulmonary: Non labored breathing on room air, no wheezing or rales.   Abdominal: Soft. Normal  bowel sounds. Non distended and non tender Neuro Alert, oriented speech somewhat difficult to understand MSK: R shoulder small ecchymosis, Tender to light touch  Skin: Skin is warm and dry.   Data Reviewed:  I have reviewed all labs and images     Latest Ref Rng & Units 04/19/2023    7:30 AM 04/18/2023    1:52 PM 04/18/2023    1:42 PM  CBC  WBC 4.0 - 10.5 K/uL 16.4   17.8   Hemoglobin 12.0 - 15.0 g/dL 8.8  9.2  8.7   Hematocrit 36.0 - 46.0 % 29.3  27.0  28.0   Platelets 150 - 400 K/uL 283   275       Latest Ref Rng & Units 04/19/2023    7:30 AM 04/18/2023    1:52 PM 04/18/2023    1:42 PM  BMP  Glucose 70 - 99 mg/dL 010  272  536   BUN 8 - 23 mg/dL 25  34  34   Creatinine 0.44 - 1.00 mg/dL 6.44  0.34  7.42   Sodium 135 - 145 mmol/L 144  140  141   Potassium 3.5 - 5.1 mmol/L 5.1  4.9  5.0   Chloride 98 - 111 mmol/L 100  98  100   CO2 22 - 32 mmol/L 31   32   Calcium 8.9 - 10.3 mg/dL 8.9   8.9      Family Communication: Family at bedside  Disposition: Status is: Inpatient Remains inpatient appropriate because: oxygen requirements   Planned Discharge Destination:  Pending evaluation     Time spent: 35 minutes  Author: Marolyn Haller, MD 04/19/2023 9:00 PM  For on call review [email or web address].

## 2023-04-19 NOTE — Significant Event (Addendum)
Acute hypoxic respiratory failure History of COPD Patient's RN reported that patient O2 sat dropped to 73% and on 8 to 10 L oxygen O2 sat up to 84%.  Patient is also hypertensive blood pressure 187/70 and moderately tachycardic heart rate 108.  Physical exam at the bedside showing that patient has bilateral upper and lower lung field rhonchi with minimum wheezing.  Patient has very poor cough reflex.  Had multiple suction at the bedside which showed thick greenish sputum. Per chart review patient has been admitted for fall.  Patient had his chest CT which did not showed any evidence of pneumonia.  It showed numerous bilateral lung nodules. -Obtaining stat chest x-ray, VBG and nasal swab.  Starting breathing treatment with DuoNeb every 4 hours as needed for wheezing and shortness of breath.  Given physical exam showing concern for septic exacerbation is starting treating with IV Solu-Medrol, DuoNeb nebulizer every 4 hours as needed, continue Mucomyst once daily, continue pulmonary toiletry, chest PT, and flutter valve.  Starting azithromycin 500 mg IV.  continue nonrebreather keep O2 sat above 92%. -Will follow-up with chest x-ray and VBG. -Requesting transfer to progressive unit.   Addendum, patient's O2 sat keep dropping with nonrebreather so we have to transition to BiPAP which is patient tolerating well O2 sat not 2 to 93%.  Tereasa Coop, MD [facility] 04/19/2023, 3:16 AM

## 2023-04-19 NOTE — Progress Notes (Signed)
Patient was on 12L HFNC. Desaturation <80%. Placed back on BIPAP 14/8 100%

## 2023-04-19 NOTE — Progress Notes (Signed)
Attempted to wean bipap to 80%.  Patient desat to 86%.  Patient returned to 100% on bipap with O2 saturation 92-95%.  Patient c/o of feeling short of breath and states "I can't breath".  Lungs diminished.  Tachypnea resp. Rate high 20s-mid 30s.  Temp, BP, and HR stable. MD notified.

## 2023-04-19 NOTE — Progress Notes (Signed)
Inpatient Rehab Admissions Coordinator:   Per therapy recommendations, patient was screened for CIR candidacy by Megan Salon, MS, CCC-SLP. At this time, Pt. It is not clear that Pt.  demonstrates medical necessity to justify in hospital rehabilitation/CIR, particularly if able to wean to baseline oxygen requirements prior to CIR admit. I will not pursue a rehab consult for this Pt. But will follow and rescreen in 1-2 days. If Pt. Is ready for d/c prior to that, TOC should seek other rehab venues.   Please contact me with any questions.  Megan Salon, MS, CCC-SLP Rehab Admissions Coordinator  [phone] (celll) [phone] (office)

## 2023-04-19 NOTE — Progress Notes (Signed)
Transfer to unit overnight. Patient non-verbally answered a couple questions by shaking head yes/no. Followed simple commands. Drowsy. But otherwise starred somewhat vacantly. Work of breathing noticed. Lung sounds unclear. Pulse ox not always reading well; generally sats around 90-96% on the bipap. Called phlebotomy to collect VBG and other labs due. Once on bipap and resting a while, BP came down from systolic 180s to 161W.

## 2023-04-19 NOTE — Progress Notes (Signed)
RT called by RN for fluctuations in the patients SPO2 (78-89%). Upon entering the room, pt did not look in distress, and SPO2 pleth was not accurate. The patients pulse ox was changed, spo2 pleth is now normal and spo2 now reads 97%

## 2023-04-19 NOTE — Progress Notes (Signed)
Pts husband called several times with no answer to update him on the change in the patients status. Voicemail was left. Patient and belongings have been transported to 6E room 21 at this time. RN will attempt another call and leave voicemail.

## 2023-04-19 NOTE — Progress Notes (Signed)
CRITICAL VALUE STICKER  CRITICAL VALUE: pCO2 75 (VBG)  RECEIVER (on-site recipient of call):  Gertie Exon RN  DATE & TIME NOTIFIED: 04/19/23 @ 0742  MD NOTIFIED: Montez Morita, MD  TIME OF NOTIFICATION:0810

## 2023-04-19 NOTE — Evaluation (Signed)
Physical Therapy Evaluation Patient Details Name: Heidi Moran MRN: [id number] DOB: Jan 23, 1936 Today's Date: 04/19/2023  History of Present Illness  Pt is a 88 y.o. female presenting to [facility] 04/18/23 s/p a fall backwards down 10-12 in her home. Trauma imaging without significant injury. Pt found to have UTI. Pt had significant event the morning of 04/19/23  with O2 sat dropping to 73% and was switched to 8-10 L on a NRB and her sat rose to 84%. Pt has been transferred to BiPAP secondary to O2 sat continuing to drop on NRB. PMHx: COPD, CHFR on 2 L of O2, HTN, HLD, DM, CKD, RA, and UTI.  Clinical Impression  Pt was admitted 04/18/23 for the above conditions with deficits listed below, see PT Problem List. PTA, pt lived in two story home with her husband with her bedroom on the second floor. Pt recently began intermittently using a rollator for ambulation and her family moved her bed to the first floor. Currently the pt is mod A for bed mobility, CGA-min A for sit to stand transfers, and min A for functional mobility with RW. PT recommends intensive rehab facility > 3hours a day with the alternative being intensive rehab < 3 hours, in which the pt and family prefer "[facility]". Will continue to follow acutely to address functional mobility, LE strength, activity tolerance, and balance.        If plan is discharge home, recommend the following: A lot of help with walking and/or transfers;A lot of help with bathing/dressing/bathroom;Assistance with cooking/housework;Assistance with feeding;Direct supervision/assist for medications management;Direct supervision/assist for financial management;Assist for transportation;Help with stairs or ramp for entrance;Supervision due to cognitive status   Can travel by private vehicle        Equipment Recommendations BSC/3in1;Hospital bed  Recommendations for Other Services  OT consult;Rehab consult    Functional Status Assessment Patient has had a recent decline in  their functional status and demonstrates the ability to make significant improvements in function in a reasonable and predictable amount of time.     Precautions / Restrictions Precautions Precautions: Fall Precaution Comments: Watch O2 Restrictions Weight Bearing Restrictions Per Provider Order: No      Mobility  Bed Mobility Overal bed mobility: Needs Assistance Bed Mobility: Supine to Sit     Supine to sit: Mod assist, HOB elevated, Used rails, Contact guard     General bed mobility comments: Pt required TC and VC to guide her hand placement to use rails. Needed extra time and Mod A for trunk assistance, CGA for leg positioning    Transfers Overall transfer level: Needs assistance Equipment used: Rolling walker (2 wheels) Transfers: Sit to/from Stand Sit to Stand: Contact guard assist, Min assist           General transfer comment: Pt min A from 3N1 and CGA for sit to stand from bed. Pt used a wide BOS, increased forward trunk lean and time, and required VC for hand placement on the bed and RW.    Ambulation/Gait Ambulation/Gait assistance: Min assist Gait Distance (Feet): 5 Feet (2 bouts: 1st 2 ft bed to 3N1; 2nd 3 ft 3N1 to recliner) Assistive device: Rolling walker (2 wheels) Gait Pattern/deviations: Step-to pattern, Decreased step length - left, Decreased step length - right, Decreased stride length, Decreased weight shift to left, Shuffle, Trunk flexed, Narrow base of support Gait velocity: reduced Gait velocity interpretation: <1.31 ft/sec, indicative of household ambulator   General Gait Details: Pt walked with short, shuffling, and slow steps. Required TC  and VC for larger steps, pivoting, RW management, and hand placement on the RW  Stairs            Wheelchair Mobility     Tilt Bed    Modified Rankin (Stroke Patients Only)       Balance Overall balance assessment: Needs assistance Sitting-balance support: Feet supported, No upper extremity  supported Sitting balance-Leahy Scale: Fair Sitting balance - Comments: Pt prefered to use bil UE support when sitting EOB and had R lateral lean. Postural control: Right lateral lean Standing balance support: Reliant on assistive device for balance, During functional activity, Bilateral upper extremity supported Standing balance-Leahy Scale: Poor Standing balance comment: Pt stands with increased trunk flexion, wide BOS, reliant on RW, and out-toeing                             Pertinent Vitals/Pain Pain Assessment Pain Assessment: Faces Faces Pain Scale: No hurt Pain Intervention(s): Monitored during session    Home Living Family/patient expects to be discharged to:: Private residence Living Arrangements: Spouse/significant other Available Help at Discharge: Family;Available 24 hours/day Type of Home: House Home Access: Stairs to enter Entrance Stairs-Rails: Right;Left;Can reach both Entrance Stairs-Number of Steps: 4 Alternate Level Stairs-Number of Steps: 14 Home Layout: Two level;Bed/bath upstairs Home Equipment: Rollator (4 wheels);Shower seat;Grab bars - toilet;Grab bars - tub/shower Additional Comments: Pt's family have moved her bed to the first floor. First floor only has a half bath. Pt family sponge bath education provided    Prior Function Prior Level of Function : Needs assist       Physical Assist : Mobility (physical);ADLs (physical) Mobility (physical): Gait;Transfers ADLs (physical): Bathing;Dressing Mobility Comments: Pt recently got rollator that she intermittently uses. Capable of being mod I for household mobility but often would grab onto family for support as well ADLs Comments: Pt reliant of caregivers for ADLs in her community.     Extremity/Trunk Assessment   Upper Extremity Assessment Upper Extremity Assessment: Defer to OT evaluation    Lower Extremity Assessment Lower Extremity Assessment: Generalized weakness;RLE  deficits/detail;LLE deficits/detail RLE Deficits / Details: Global knee MMT 3+/5 RLE Sensation:  (Pt reports equal sensation bil but required multiple trials before providing an answer.  Simultaneous filing. User may not have seen previous data.) LLE Deficits / Details: Global knee MMT 3+/5. LLE Sensation:  (Pt reports equal sensation bil but required multiple trials before providing an answer.  Simultaneous filing. User may not have seen previous data.)    Cervical / Trunk Assessment Cervical / Trunk Assessment: Kyphotic  Communication   Communication Communication: Hearing impairment;Difficulty communicating thoughts/reduced clarity of speech Cueing Techniques: Verbal cues;Tactile cues;Visual cues  Cognition   Behavior During Therapy: Flat affect Overall Cognitive Status: Impaired/Different from baseline Area of Impairment: Memory, Following commands, Awareness, Attention, Problem solving, Safety/judgement                     Memory: Decreased short-term memory Following Commands: Follows one step commands inconsistently, Follows one step commands with increased time Safety/Judgement: Decreased awareness of safety   Problem Solving: Slow processing, Decreased initiation, Difficulty sequencing, Requires verbal cues, Requires tactile cues General Comments: Pt requires increased time for commands, follows directions inconsistently, and had periods of intentional attention followed by periods of inattention        General Comments General comments (skin integrity, edema, etc.): VSS on 9L HFNC, BP 132/88 (94) supine, 176/65 (94) sitting  Exercises General Exercises - Lower Extremity Long Arc Quad: Both, 10 reps, Seated Hip Flexion/Marching: Both, 5 reps, Seated   Assessment/Plan    PT Assessment Patient needs continued PT services  PT Problem List Decreased strength;Decreased activity tolerance;Decreased range of motion;Decreased mobility;Decreased balance;Decreased  cognition;Decreased knowledge of use of DME;Decreased safety awareness;Decreased knowledge of precautions;Cardiopulmonary status limiting activity;Decreased coordination       PT Treatment Interventions DME instruction;Gait training;Stair training;Functional mobility training;Therapeutic activities;Therapeutic exercise;Balance training;Neuromuscular re-education;Patient/family education;Cognitive remediation    PT Goals (Current goals can be found in the Care Plan section)  Acute Rehab PT Goals Patient Stated Goal: return home/ rehab facility PT Goal Formulation: With patient/family Time For Goal Achievement: 05/03/23 Potential to Achieve Goals: Fair    Frequency Min 1X/week     Co-evaluation               AM-PAC PT "6 Clicks" Mobility  Outcome Measure Help needed turning from your back to your side while in a flat bed without using bedrails?: A Lot Help needed moving from lying on your back to sitting on the side of a flat bed without using bedrails?: A Lot Help needed moving to and from a bed to a chair (including a wheelchair)?: A Little Help needed standing up from a chair using your arms (e.g., wheelchair or bedside chair)?: A Little Help needed to walk in hospital room?: Total Help needed climbing 3-5 steps with a railing? : Total 6 Click Score: 12    End of Session Equipment Utilized During Treatment: Gait belt;Oxygen Activity Tolerance: Patient limited by fatigue Patient left: in chair;with call bell/phone within reach;with chair alarm set Nurse Communication: Mobility status PT Visit Diagnosis: Unsteadiness on feet (R26.81);Other abnormalities of gait and mobility (R26.89);History of falling (Z91.81);Muscle weakness (generalized) (M62.81);Difficulty in walking, not elsewhere classified (R26.2)    Time: 4098-1191 PT Time Calculation (min) (ACUTE ONLY): 53 min   Charges:   PT Evaluation $PT Eval Moderate Complexity: 1 Mod PT Treatments $Gait Training: 8-22  mins $Therapeutic Exercise: 8-22 mins $Therapeutic Activity: 8-22 mins PT General Charges $$ ACUTE PT VISIT: 1 Visit         [address], SPT   [place] 04/19/2023, 2:03 PM

## 2023-04-19 NOTE — Care Management Obs Status (Signed)
MEDICARE OBSERVATION STATUS NOTIFICATION   Patient Details  Name: DALLY OSHEL MRN: [id number] Date of Birth: 03/20/1935   Medicare Observation Status Notification Given:  Yes    Ronny Bacon, RN 04/19/2023, 10:36 AM

## 2023-04-20 ENCOUNTER — Inpatient Hospital Stay (HOSPITAL_COMMUNITY): Payer: Medicare Other

## 2023-04-20 DIAGNOSIS — N179 Acute kidney failure, unspecified: Secondary | ICD-10-CM | POA: Diagnosis not present

## 2023-04-20 DIAGNOSIS — W108XXA Fall (on) (from) other stairs and steps, initial encounter: Secondary | ICD-10-CM | POA: Diagnosis not present

## 2023-04-20 DIAGNOSIS — J9601 Acute respiratory failure with hypoxia: Secondary | ICD-10-CM | POA: Diagnosis not present

## 2023-04-20 LAB — CBC WITH DIFFERENTIAL/PLATELET
Abs Immature Granulocytes: 0.25 10*3/uL — ABNORMAL HIGH (ref 0.00–0.07)
Basophils Absolute: 0 10*3/uL (ref 0.0–0.1)
Basophils Relative: 0 %
Eosinophils Absolute: 0 10*3/uL (ref 0.0–0.5)
Eosinophils Relative: 0 %
HCT: 26.7 % — ABNORMAL LOW (ref 36.0–46.0)
Hemoglobin: 7.9 g/dL — ABNORMAL LOW (ref 12.0–15.0)
Immature Granulocytes: 1 %
Lymphocytes Relative: 2 %
Lymphs Abs: 0.3 10*3/uL — ABNORMAL LOW (ref 0.7–4.0)
MCH: 29.5 pg (ref 26.0–34.0)
MCHC: 29.6 g/dL — ABNORMAL LOW (ref 30.0–36.0)
MCV: 99.6 fL (ref 80.0–100.0)
Monocytes Absolute: 0.8 10*3/uL (ref 0.1–1.0)
Monocytes Relative: 4 %
Neutro Abs: 19.2 10*3/uL — ABNORMAL HIGH (ref 1.7–7.7)
Neutrophils Relative %: 93 %
Platelets: 291 10*3/uL (ref 150–400)
RBC: 2.68 MIL/uL — ABNORMAL LOW (ref 3.87–5.11)
RDW: 12.7 % (ref 11.5–15.5)
WBC: 20.6 10*3/uL — ABNORMAL HIGH (ref 4.0–10.5)
nRBC: 0 % (ref 0.0–0.2)

## 2023-04-20 LAB — BASIC METABOLIC PANEL
Anion gap: 7 (ref 5–15)
BUN: 23 mg/dL (ref 8–23)
CO2: 33 mmol/L — ABNORMAL HIGH (ref 22–32)
Calcium: 8.6 mg/dL — ABNORMAL LOW (ref 8.9–10.3)
Chloride: 101 mmol/L (ref 98–111)
Creatinine, Ser: 0.9 mg/dL (ref 0.44–1.00)
GFR, Estimated: >60 mL/min (ref >60)
Glucose, Bld: 151 mg/dL — ABNORMAL HIGH (ref 70–99)
Potassium: 4.9 mmol/L (ref 3.5–5.1)
Sodium: 141 mmol/L (ref 135–145)

## 2023-04-20 LAB — GLUCOSE, CAPILLARY
Glucose-Capillary: 132 mg/dL — ABNORMAL HIGH (ref 70–99)
Glucose-Capillary: 421 mg/dL — ABNORMAL HIGH (ref 70–99)
Glucose-Capillary: 321 mg/dL — ABNORMAL HIGH (ref 70–99)
Glucose-Capillary: 150 mg/dL — ABNORMAL HIGH (ref 70–99)
Glucose-Capillary: 292 mg/dL — ABNORMAL HIGH (ref 70–99)

## 2023-04-20 LAB — VITAMIN D 25 HYDROXY (VIT D DEFICIENCY, FRACTURES): Vit D, 25-Hydroxy: 44.62 ng/mL (ref 30–100)

## 2023-04-20 LAB — URINE CULTURE: Culture: >=100000 — AB

## 2023-04-20 LAB — MAGNESIUM: Magnesium: 2.5 mg/dL — ABNORMAL HIGH (ref 1.7–2.4)

## 2023-04-20 MED ORDER — INSULIN ASPART 100 UNIT/ML IJ SOLN
0.0000 [IU] | Freq: Three times a day (TID) | INTRAMUSCULAR | Status: DC
  Administered 2023-04-21: 2 [IU] via SUBCUTANEOUS
  Administered 2023-04-21: 5 [IU] via SUBCUTANEOUS
  Administered 2023-04-21 – 2023-04-22 (×2): 2 [IU] via SUBCUTANEOUS
  Administered 2023-04-22: 9 [IU] via SUBCUTANEOUS

## 2023-04-20 MED ORDER — INSULIN ASPART 100 UNIT/ML IJ SOLN
0.0000 [IU] | Freq: Every day | INTRAMUSCULAR | Status: DC
  Administered 2023-04-20: 3 [IU] via SUBCUTANEOUS

## 2023-04-20 MED ORDER — ENSURE ENLIVE PO LIQD: 237.0000 mL | Freq: Two times a day (BID) | ORAL | Status: DC

## 2023-04-20 MED ORDER — VITAMIN B-12 100 MCG PO TABS
100.0000 ug | ORAL_TABLET | Freq: Every day | ORAL | Status: DC
  Administered 2023-04-20 – 2023-04-22 (×3): 100 ug via ORAL
  Filled 2023-04-20 (×3): qty 1

## 2023-04-20 NOTE — Progress Notes (Signed)
OT Cancellation Note  Patient Details Name: Heidi Moran MRN: [id number] DOB: February 24, 1936   Cancelled Treatment:    Reason Eval/Treat Not Completed: Medical issues which prohibited therapy (Per conversation with RN, pt with increased respiratory issues this afternoon and needing to be placed on BiPAP. Per RN request, OT holding eval for today. OT to reattempt to see pt at a later time as appropriate/available.)  Heidi Moran "Heidi Miyamoto" M., OTR/L, MA Acute Rehab [phone]   Lendon Colonel 04/20/2023, 3:56 PM

## 2023-04-20 NOTE — Plan of Care (Signed)

## 2023-04-20 NOTE — Progress Notes (Signed)
  Progress Note   Patient: Heidi Moran WUJ:[id number] DOB: 08/03/1935 DOA: 04/18/2023     1 DOS: the patient was seen and examined on 04/20/2023 at 2:17PM and 3:38PM      Brief hospital course: 88 y.o. F with dCHF and COPD on home O2 2L, pulmonary nodules, HTN, HLD, DM, RA not on DMARD, who presented with falling down a flight of stairs.  In the ER, remarkably the Trauma service found no traumatic injuries.  She was found to have  UTI, so she was admitted for eval.     Assessment and Plan: Weakness due to UTI versus COPD exacerbation Acute on chronic respiratory failure COPD with possble exacerbation Initially admitted for weakness/fall and thought to have UTI.  Overnight first night developed severe hypoxia requiring BiPAP with normal CXR.    Antibiotics broadened, steroids started.  Today again, this time after getting Mucomyst neb, she developed acute respiratory distress, and required BiPAP again.  Lung sounds diminished.  Improved quickly with Duoneb. - Continue CTX and azithro - Continue steroids - Continue bronchodilators   Fall  - PT/OT  Anemia of chronic disease Hgb slightly lower than baseline 8-9 g/dL No bleeding observed or reported B12 low - Trend Hgb  B12 deficiency Given injection - Start vitamin B12 PO  Diabetes with hyperglycemia -Hold home metformin and glipizide - Continue sliding scale corrections increase dose  Hypertension -Hold lisinopril - Continue as needed hydralazine  Hyperlipidemia -Continue Lipitor  Rheumatoid arthritis Not on DMARD, no active disease -Continue home oxycodone       Subjective: Patient feeling slightly stronger, she is little bit confused but not as bad as yesterday.  She has had some hyperglycemia today.  She had a reaction after getting Mucomyst in which she was extremely short of breath, but this resolved quickly with a DuoNeb     Physical Exam: BP (!) 144/60 (BP Location: Left Arm)   Pulse 91   Temp  97.8 F (36.6 C) (Oral)   Resp 18   Ht 4\' 11"  (1.499 m)   Wt 44.4 kg   SpO2 100%   BMI 19.77 kg/m   Frail elderly female, lying in bed, appears weak and tired RRR, no murmurs, no peripheral edema Lungs extremely diminished, respiratory effort appears increased Abdomen soft no tenderness palpation or guarding She responds to questions, but she has psychomotor slowing and is very hard to hear, and seems to answer questions in a tangential and disorganized way, face symmetric, speech very hypophonic, generalized weakness  Data Reviewed: CBC shows hemoglobin down to 7.9 Basic metabolic panel shows creatinine 0.9 White blood cell count up due to steroids Urine culture with E. coli    Family Communication: Husband at the bedside    Disposition: Status is: Inpatient The patient patient was admitted after falling down a flight of stairs  Remarkably she had no injuries, but she was found here to have possibly a UTI, as well as acute hypoxia requiring steroids, bronchodilators  At the moment, or if her respiratory status stabilizes, we should be able to transition to oral agents in the next 24 to 48 hours and discharged to inpatient rehab as soon as a bed is available          Author: Alberteen Sam, MD 04/20/2023 7:13 PM  For on call review [email or web address].

## 2023-04-20 NOTE — Plan of Care (Signed)
  Problem: Tissue Perfusion: Goal: Adequacy of tissue perfusion will improve Outcome: Progressing   Problem: Clinical Measurements: Goal: Respiratory complications will improve Outcome: Progressing Goal: Cardiovascular complication will be avoided Outcome: Progressing   Problem: Safety: Goal: Ability to remain free from injury will improve Outcome: Progressing

## 2023-04-21 DIAGNOSIS — I10 Essential (primary) hypertension: Secondary | ICD-10-CM

## 2023-04-21 DIAGNOSIS — W108XXA Fall (on) (from) other stairs and steps, initial encounter: Secondary | ICD-10-CM | POA: Diagnosis not present

## 2023-04-21 LAB — COMPREHENSIVE METABOLIC PANEL
ALT: 15 U/L (ref 0–44)
AST: 12 U/L — ABNORMAL LOW (ref 15–41)
Albumin: 2.8 g/dL — ABNORMAL LOW (ref 3.5–5.0)
Alkaline Phosphatase: 46 U/L (ref 38–126)
Anion gap: 6 (ref 5–15)
BUN: 26 mg/dL — ABNORMAL HIGH (ref 8–23)
CO2: 33 mmol/L — ABNORMAL HIGH (ref 22–32)
Calcium: 8.6 mg/dL — ABNORMAL LOW (ref 8.9–10.3)
Chloride: 103 mmol/L (ref 98–111)
Creatinine, Ser: 0.91 mg/dL (ref 0.44–1.00)
GFR, Estimated: >60 mL/min (ref >60)
Glucose, Bld: 182 mg/dL — ABNORMAL HIGH (ref 70–99)
Potassium: 4.8 mmol/L (ref 3.5–5.1)
Sodium: 142 mmol/L (ref 135–145)
Total Bilirubin: 0.3 mg/dL (ref 0.0–1.2)
Total Protein: 5.8 g/dL — ABNORMAL LOW (ref 6.5–8.1)

## 2023-04-21 LAB — CBC
HCT: 25.2 % — ABNORMAL LOW (ref 36.0–46.0)
Hemoglobin: 7.6 g/dL — ABNORMAL LOW (ref 12.0–15.0)
MCH: 29.8 pg (ref 26.0–34.0)
MCHC: 30.2 g/dL (ref 30.0–36.0)
MCV: 98.8 fL (ref 80.0–100.0)
Platelets: 277 10*3/uL (ref 150–400)
RBC: 2.55 MIL/uL — ABNORMAL LOW (ref 3.87–5.11)
RDW: 12.7 % (ref 11.5–15.5)
WBC: 14.2 10*3/uL — ABNORMAL HIGH (ref 4.0–10.5)
nRBC: 0 % (ref 0.0–0.2)

## 2023-04-21 LAB — GLUCOSE, CAPILLARY
Glucose-Capillary: 198 mg/dL — ABNORMAL HIGH (ref 70–99)
Glucose-Capillary: 278 mg/dL — ABNORMAL HIGH (ref 70–99)
Glucose-Capillary: 198 mg/dL — ABNORMAL HIGH (ref 70–99)
Glucose-Capillary: 137 mg/dL — ABNORMAL HIGH (ref 70–99)

## 2023-04-21 MED ORDER — ENSURE ENLIVE PO LIQD
237.0000 mL | Freq: Two times a day (BID) | ORAL | Status: DC
  Administered 2023-04-22: 237 mL via ORAL

## 2023-04-21 NOTE — Progress Notes (Signed)
Inpatient Rehab Admissions Coordinator:   Per therapy recommendations, patient was screened for CIR candidacy by Megan Salon, MS, CCC-SLP. Once stable from a respiratory standpoint, I do not think Pt. Will have medical necessity for a CIR admit. I will not pursue a rehab consult for this Pt.   Recommend other rehab venues to be pursued.  Please contact me with any questions.  Megan Salon, MS, CCC-SLP Rehab Admissions Coordinator  [phone] (celll) [phone] (office)

## 2023-04-21 NOTE — Inpatient Diabetes Management (Signed)
Inpatient Diabetes Program Recommendations  AACE/ADA: New Consensus Statement on Inpatient Glycemic Control (2015)  Target Ranges:  Prepandial:   less than 140 mg/dL      Peak postprandial:   less than 180 mg/dL (1-2 hours)      Critically ill patients:  140 - 180 mg/dL   Lab Results  Component Value Date   GLUCAP 278 (H) 04/21/2023   HGBA1C 6.1 (H) 04/19/2023    Review of Glycemic Control  Latest Reference Range & Units 04/20/23 07:29 04/20/23 11:39 04/20/23 14:19 04/20/23 17:07 04/20/23 21:33 04/21/23 07:27 04/21/23 11:19  Glucose-Capillary 70 - 99 mg/dL 562 (H) 130 (H) 865 (H) 150 (H) 292 (H) 198 (H) 278 (H)   Diabetes history: DM 2 Outpatient Diabetes medications: Glucovace 2.5-500 mg 1 tablet daily Current orders for Inpatient glycemic control:  Novolog 0-9 units tid + hs  Solumedrol 40 gm Q12 hours  Inpatient Diabetes Program Recommendations:    Post-prandial glucose trends increase  -   Consider adding Novolog 2 units tid meal coverage if eating >50% of meals  Thanks,  Christena Deem RN, MSN, BC-ADM Inpatient Diabetes Coordinator Team Pager [phone] (8a-5p)

## 2023-04-21 NOTE — Evaluation (Signed)
Occupational Therapy Evaluation Patient Details Name: Heidi Moran MRN: [id number] DOB: February 18, 1936 Today's Date: 04/21/2023   History of Present Illness Pt is a 88 y.o. female presenting to [facility] 04/18/23 s/p a fall backwards down 10-12 in her home. Trauma imaging without significant injury. Pt found to have UTI. Pt had significant event the morning of 04/19/23  with O2 sat dropping to 73% and was switched to 8-10 L on a NRB and her sat rose to 84%. Pt has been transferred to BiPAP secondary to O2 sat continuing to drop on NRB. PMHx: COPD, CHFR on 2 L of O2, HTN, HLD, DM, CKD, RA, and UTI.   Clinical Impression   PTA, pt lives with spouse, typically ambulatory with Rollator and has intermittent assist for ADLs as needed. Pt presents with deficits in cognition, standing balance, strength and endurance. Pt requires Min A for BSC transfers, Min A for UB ADLs and up to Max A for LB ADLs.. After transfers, pt reported significant fatigue and deferred further mobility. Pt's daughter present, confirms pt is not at cognitive baseline and both appear open to continued inpatient follow up therapy, <3 hours/day with preference for [facility].    Spo2 > 92% on 2 L O2 with activity     If plan is discharge home, recommend the following: A little help with walking and/or transfers;A lot of help with bathing/dressing/bathroom    Functional Status Assessment  Patient has had a recent decline in their functional status and demonstrates the ability to make significant improvements in function in a reasonable and predictable amount of time.  Equipment Recommendations  None recommended by OT    Recommendations for Other Services       Precautions / Restrictions Precautions Precautions: Fall Precaution Comments: Watch O2 Restrictions Weight Bearing Restrictions Per Provider Order: No      Mobility Bed Mobility Overal bed mobility: Needs Assistance Bed Mobility: Supine to Sit     Supine to sit: Mod  assist, HOB elevated, Used rails          Transfers Overall transfer level: Needs assistance Equipment used: Rolling walker (2 wheels) Transfers: Sit to/from Stand, Bed to chair/wheelchair/BSC Sit to Stand: Min assist     Step pivot transfers: Min assist     General transfer comment: to/from [facility] with RW      Balance Overall balance assessment: Needs assistance Sitting-balance support: Feet supported, No upper extremity supported Sitting balance-Leahy Scale: Fair     Standing balance support: Reliant on assistive device for balance, During functional activity, Bilateral upper extremity supported Standing balance-Leahy Scale: Poor                             ADL either performed or assessed with clinical judgement   ADL Overall ADL's : Needs assistance/impaired Eating/Feeding: Set up;Minimal assistance;Sitting   Grooming: Minimal assistance;Sitting;Oral care;Wash/dry face Grooming Details (indicate cue type and reason): able to wash face with Setup. Min A needed to brush teeth seated in recliner, noted difficulty leaning forward to spit in basin resulting in small mess Upper Body Bathing: Minimal assistance;Sitting   Lower Body Bathing: Moderate assistance;Sit to/from stand;Sitting/lateral leans   Upper Body Dressing : Minimal assistance;Sitting   Lower Body Dressing: Maximal assistance;Sitting/lateral leans;Sit to/from stand   Toilet Transfer: Minimal assistance;Stand-pivot;BSC/3in1;Rolling walker (2 wheels) Toilet Transfer Details (indicate cue type and reason): handheld assist to [facility] due to urgency and use of RW to return to recliner with improved stabiliy  Toileting- Clothing Manipulation and Hygiene: Maximal assistance;Sitting/lateral lean;Sit to/from stand Toileting - Clothing Manipulation Details (indicate cue type and reason): assisted with mesh underwear mgmt and hygiene in standing             Vision Baseline Vision/History: 1 Wears  glasses Ability to See in Adequate Light: 0 Adequate Patient Visual Report: No change from baseline Vision Assessment?: No apparent visual deficits     Perception         Praxis         Pertinent Vitals/Pain Pain Assessment Pain Assessment: No/denies pain     Extremity/Trunk Assessment Upper Extremity Assessment Upper Extremity Assessment: Generalized weakness;Right hand dominant   Lower Extremity Assessment Lower Extremity Assessment: Defer to PT evaluation   Cervical / Trunk Assessment Cervical / Trunk Assessment: Kyphotic   Communication Communication Communication: Hearing impairment;Difficulty communicating thoughts/reduced clarity of speech Cueing Techniques: Verbal cues;Gestural cues;Tactile cues   Cognition Arousal: Alert Behavior During Therapy: Flat affect Overall Cognitive Status: Impaired/Different from baseline Area of Impairment: Memory, Following commands, Awareness, Attention, Problem solving, Safety/judgement                   Current Attention Level: Sustained Memory: Decreased short-term memory Following Commands: Follows one step commands inconsistently, Follows one step commands with increased time Safety/Judgement: Decreased awareness of safety Awareness: Emergent Problem Solving: Slow processing, Decreased initiation, Difficulty sequencing, Requires verbal cues, Requires tactile cues General Comments: Pt requires increased time for commands, follows directions inconsistently and does need cues to redirect. pt will initially make a coherent statement then tangential into another nonsensical statement. daughter present says this has been happening since admission and pt also with some hallucinations     General Comments  Daughter present and supportive    Exercises     Shoulder Instructions      Home Living Family/patient expects to be discharged to:: Private residence Living Arrangements: Spouse/significant other Available Help at  Discharge: Family;Available 24 hours/day Type of Home: House Home Access: Stairs to enter [employer] of Steps: 4 Entrance Stairs-Rails: Right;Left;Can reach both Home Layout: Two level;Bed/bath upstairs Alternate Level Stairs-Number of Steps: 14 Alternate Level Stairs-Rails: Right;Left Bathroom Shower/Tub: Producer, television/film/video: Standard Bathroom Accessibility: Yes   Home Equipment: Rollator (4 wheels);Shower seat;Grab bars - toilet;Grab bars - tub/shower   Additional Comments: Pt's family have moved her bed to the first floor. First floor only has a half bath. Looking into putting in a stair lift      Prior Functioning/Environment Prior Level of Function : Needs assist           ADLs (physical): Bathing;Dressing Mobility Comments: Pt recently got rollator that she intermittently uses. Capable of being mod I for household mobility but often would grab onto family for support as well ADLs Comments: Some assist with ADLs. Family completes IADLs        OT Problem List: Decreased strength;Decreased activity tolerance;Impaired balance (sitting and/or standing);Decreased cognition;Decreased safety awareness;Decreased knowledge of use of DME or AE;Decreased coordination;Cardiopulmonary status limiting activity      OT Treatment/Interventions: Self-care/ADL training;Therapeutic exercise;DME and/or AE instruction;Energy conservation;Therapeutic activities;Patient/family education;Balance training    OT Goals(Current goals can be found in the care plan section) Acute Rehab OT Goals Patient Stated Goal: minimize fall risk at home. pt interested in [place] OT Goal Formulation: With patient/family Time For Goal Achievement: 05/05/23 Potential to Achieve Goals: Good  OT Frequency: Min 1X/week    Co-evaluation  AM-PAC OT "6 Clicks" Daily Activity     Outcome Measure Help from another person eating meals?: A Little Help from another person  taking care of personal grooming?: A Little Help from another person toileting, which includes using toliet, bedpan, or urinal?: A Lot Help from another person bathing (including washing, rinsing, drying)?: A Lot Help from another person to put on and taking off regular upper body clothing?: A Little Help from another person to put on and taking off regular lower body clothing?: A Lot 6 Click Score: 15   End of Session Equipment Utilized During Treatment: Rolling walker (2 wheels);Oxygen Nurse Communication: Mobility status  Activity Tolerance: Patient tolerated treatment well Patient left: in chair;with call bell/phone within reach;with chair alarm set;with nursing/sitter in room  OT Visit Diagnosis: Unsteadiness on feet (R26.81);Other abnormalities of gait and mobility (R26.89);Muscle weakness (generalized) (M62.81)                Time: 9147-8295 OT Time Calculation (min): 40 min Charges:  OT General Charges $OT Visit: 1 Visit OT Evaluation $OT Eval Moderate Complexity: 1 Mod OT Treatments $Self Care/Home Management : 8-22 mins $Therapeutic Activity: 8-22 mins  Bradd Canary, OTR/L Acute Rehab Services Office: [phone]   Lorre Munroe 04/21/2023, 2:28 PM

## 2023-04-21 NOTE — TOC Initial Note (Signed)
Transition of Care [facility]) - Initial/Assessment Note    Patient Details  Name: Heidi Moran MRN: [id number] Date of Birth: Jan 10, 1936  Transition of [facility]) CM/SW Contact:    Delilah Shan, LCSWA Phone Number: 04/21/2023, 2:24 PM  Clinical Narrative:                  CSW received consult for possible SNF placement at time of discharge. Due to patients current orientation CSW spoke with patients spouse regarding PT recommendation of SNF placement for patient at time of discharge. Patients spouse expressed understanding of PT recommendation and is agreeable to SNF placement at time of discharge. Patients spouse gave CSW permission to fax out patients initial referral for SNF placement.Patients spouse reports preference for [employer] . CSW discussed insurance authorization process. No further questions reported at this time. CSW to continue to follow and assist with discharge planning needs.   Expected Discharge Plan: Skilled Nursing Facility Barriers to Discharge: Continued Medical Work up   Patient Goals and CMS Choice     Choice offered to / list presented to : Spouse      Expected Discharge Plan and Services In-house Referral: Clinical Social Work     Living arrangements for the past 2 months: Single Family Home                                      Prior Living Arrangements/Services Living arrangements for the past 2 months: Single Family Home Lives with:: Spouse Patient language and need for interpreter reviewed:: Yes        Need for Family Participation in Patient Care: Yes (Comment) Care giver support system in place?: Yes (comment)   Criminal Activity/Legal Involvement Pertinent to Current Situation/Hospitalization: No - Comment as needed  Activities of Daily Living      Permission Sought/Granted Permission sought to share information with : Case Manager, Magazine features editor, Family Supports                Emotional Assessment        Orientation: : Oriented to Self, Oriented to Place Alcohol / Substance Use: Not Applicable Psych Involvement: No (comment)  Admission diagnosis:  Fall (on) (from) other stairs and steps, initial encounter [W10.8XXA] Acute hypoxemic respiratory failure (HCC) [J96.01] Patient Active Problem List   Diagnosis Date Noted   Acute hypoxemic respiratory failure (HCC) 04/19/2023   Fall (on) (from) other stairs and steps, initial encounter 04/18/2023   UTI (urinary tract infection) 04/18/2023   AKI (acute kidney injury) (HCC) 04/18/2023   Pain due to onychomycosis of toenails of both feet 10/04/2020   Obstructive airway disease (HCC) 02/04/2020   Abnormal findings on diagnostic imaging of lung 02/04/2020   Chronic respiratory failure with hypoxia (HCC) 01/15/2020   Controlled type 2 diabetes mellitus with hyperglycemia, without long-term current use of insulin (HCC) 01/15/2020   Mixed diabetic hyperlipidemia associated with type 2 diabetes mellitus (HCC) 01/15/2020   GERD without esophagitis 01/15/2020   Normocytic anemia 01/15/2020   Essential hypertension 05/18/2017   PCP:  Geoffry Paradise, MD Pharmacy:   [facility] [phone] [place], [place] - [address] AT [facility] [address] & [facility] [address] [place] [address] Phone: [phone] Fax: [phone]  [facility] - [place], [place] - [address] [place] [address] Phone: [phone] Fax: [phone]  [id number] [place], [place] - [address] [place] [address] Phone: [phone] Fax: [phone]     Social Drivers of Health (SDOH) Social History: SDOH Screenings   Food Insecurity: No Food Insecurity (04/18/2023)  Housing: Low Risk  (04/18/2023)  Transportation Needs: No Transportation Needs (04/18/2023)  Utilities: Not At Risk (04/18/2023)  Alcohol Screen: Low Risk  (08/31/2021)  Depression (PHQ2-9): Medium  Risk (08/31/2021)  Financial Resource Strain: Low Risk  (08/31/2021)  Physical Activity: Inactive (08/31/2021)  Social Connections: Socially Integrated (04/18/2023)  Stress: No Stress Concern Present (08/31/2021)  Tobacco Use: Low Risk  (04/18/2023)   SDOH Interventions: Housing Interventions: Intervention Not Indicated   Readmission Risk Interventions     No data to display

## 2023-04-21 NOTE — [place] FL2 (Signed)
[place] MEDICAID FL2 LEVEL OF CARE FORM     IDENTIFICATION  Patient Name: Heidi Moran Birthdate: 1936/01/12 Sex: female Admission Date (Current Location): 04/18/2023  [facility] and [place] Number:  Producer, television/film/video and Address:  [facility] [place], [address], [place], [place] [address]      Provider Number: [id number]  Attending Physician Name and Address:  Joycelyn Das, MD  Relative Name and Phone Number:  Onalee Hua (spouse) [phone]    Current Level of Care: Hospital Recommended Level of Care: Skilled Nursing Facility Prior Approval Number:    Date Approved/Denied:   PASRR Number: [id number]  Discharge Plan: SNF    Current Diagnoses: Patient Active Problem List   Diagnosis Date Noted   Acute hypoxemic respiratory failure (HCC) 04/19/2023   Fall (on) (from) other stairs and steps, initial encounter 04/18/2023   UTI (urinary tract infection) 04/18/2023   AKI (acute kidney injury) (HCC) 04/18/2023   Pain due to onychomycosis of toenails of both feet 10/04/2020   Obstructive airway disease (HCC) 02/04/2020   Abnormal findings on diagnostic imaging of lung 02/04/2020   Chronic respiratory failure with hypoxia (HCC) 01/15/2020   Controlled type 2 diabetes mellitus with hyperglycemia, without long-term current use of insulin (HCC) 01/15/2020   Mixed diabetic hyperlipidemia associated with type 2 diabetes mellitus (HCC) 01/15/2020   GERD without esophagitis 01/15/2020   Normocytic anemia 01/15/2020   Essential hypertension 05/18/2017    Orientation RESPIRATION BLADDER Height & Weight     Self, Place  O2 (Nasal Cannula 6 liters) Incontinent, External catheter (External Urinary Catheter) Weight: 97 lb 14.2 oz (44.4 kg) Height:  4\' 11"  (149.9 cm)  BEHAVIORAL SYMPTOMS/MOOD NEUROLOGICAL BOWEL NUTRITION STATUS        Diet (Please see discharge summary)  AMBULATORY STATUS COMMUNICATION OF NEEDS Skin   Limited Assist   Other (Comment)  (Abrasion,arm,shoulder,R,L,Ecchymosis,arm,Bil.,Erythema,shoulder,R,upper,Wound/Incision LDAs,Wound/Incision open or dehisced skin tear,arm,L,lower,posterior)                       Personal Care Assistance Level of Assistance  Bathing, Feeding, Dressing Bathing Assistance: Maximum assistance Feeding assistance: Independent Dressing Assistance: Maximum assistance     Functional Limitations Info  Sight, Hearing, Speech Sight Info: Impaired Hearing Info: Impaired Speech Info: Adequate    SPECIAL CARE FACTORS FREQUENCY  PT (By licensed PT), OT (By licensed OT)     PT Frequency: 5x min weekly OT Frequency: 5x min weekly            Contractures Contractures Info: Not present    Additional Factors Info  Code Status, Allergies, Insulin Sliding Scale Code Status Info: DNR Allergies Info: NKA   Insulin Sliding Scale Info: insulin aspart (novoLOG) injection 0-5 Units daily at bedtime,insulin aspart (novoLOG) injection 0-9 Units 3 times daily with meals       Current Medications (04/21/2023):  This is the current hospital active medication list Current Facility-Administered Medications  Medication Dose Route Frequency Provider Last Rate Last Admin   acetaminophen (TYLENOL) tablet 1,000 mg  1,000 mg Oral Q6H PRN Segars, Christiane Ha, MD       atorvastatin (LIPITOR) tablet 20 mg  20 mg Oral QHS Dolly Rias, MD   20 mg at 04/20/23 2114   azithromycin (ZITHROMAX) 500 mg in sodium chloride 0.9 % 250 mL IVPB  500 mg Intravenous Q24H Sundil, Subrina, MD 250 mL/hr at 04/21/23 0426 500 mg at 04/21/23 0426   cefTRIAXone (ROCEPHIN) 1 g in sodium chloride 0.9 % 100  mL IVPB  1 g Intravenous Q24H Dolly Rias, MD 200 mL/hr at 04/20/23 1745 1 g at 04/20/23 1745   enoxaparin (LOVENOX) injection 40 mg  40 mg Subcutaneous Q24H Dolly Rias, MD   40 mg at 04/19/23 2135   famotidine (PEPCID) tablet 20 mg  20 mg Oral QHS Dolly Rias, MD   20 mg at 04/20/23 2114   hydrALAZINE  (APRESOLINE) injection 10 mg  10 mg Intravenous Q8H PRN Sundil, Subrina, MD       insulin aspart (novoLOG) injection 0-5 Units  0-5 Units Subcutaneous QHS Alberteen Sam, MD   3 Units at 04/20/23 2139   insulin aspart (novoLOG) injection 0-9 Units  0-9 Units Subcutaneous TID WC Alberteen Sam, MD   5 Units at 04/21/23 1132   ipratropium-albuterol (DUONEB) 0.5-2.5 (3) MG/3ML nebulizer solution 3 mL  3 mL Nebulization Q4H PRN Janalyn Shy, Subrina, MD   3 mL at 04/20/23 1545   melatonin tablet 6 mg  6 mg Oral QHS PRN Dolly Rias, MD   6 mg at 04/19/23 2135   methylPREDNISolone sodium succinate (SOLU-MEDROL) 40 mg/mL injection 40 mg  40 mg Intravenous Q12H Sundil, Subrina, MD   40 mg at 04/21/23 0421   oxyCODONE (Oxy IR/ROXICODONE) immediate release tablet 2.5 mg  2.5 mg Oral Q6H PRN Dolly Rias, MD       Or   oxyCODONE (Oxy IR/ROXICODONE) immediate release tablet 5 mg  5 mg Oral Q6H PRN Segars, Christiane Ha, MD       tiZANidine (ZANAFLEX) tablet 2 mg  2 mg Oral QHS PRN Segars, Christiane Ha, MD       umeclidinium-vilanterol (ANORO ELLIPTA) 62.5-25 MCG/ACT 1 puff  1 puff Inhalation Daily Dolly Rias, MD   1 puff at 04/21/23 6045   vitamin B-12 (CYANOCOBALAMIN) tablet 100 mcg  100 mcg Oral Daily Alberteen Sam, MD   100 mcg at 04/21/23 4098     Discharge Medications: Please see discharge summary for a list of discharge medications.  Relevant Imaging Results:  Relevant Lab Results:   Additional Information [id number]-[id number]  Delilah Shan, LCSWA

## 2023-04-21 NOTE — Plan of Care (Signed)

## 2023-04-21 NOTE — Progress Notes (Signed)
PROGRESS NOTE  Heidi Moran:[id number] DOB: [birth year] DOA: 04/18/2023 PCP: Geoffry Paradise, MD   LOS: 2 days   Brief narrative:  Show female with past medical history of diastolic congestive heart failure, COPD on home oxygen, pulmonary nodules, hypertension, hyperlipidemia, diabetes mellitus type 2, reported arthritis not on DMA RD presented hospital after having a fall down a flight of stairs.  In the ED, patient was seen by trauma service and did not have any trauma.  She was however noted to have UTIs and was admitted hospital for further evaluation and treatment.  Patient has been seen by PT and recommended CIR.    Assessment/Plan: Principal Problem:   Fall (on) (from) other stairs and steps, initial encounter Active Problems:   UTI (urinary tract infection)   AKI (acute kidney injury) (HCC)   Acute hypoxemic respiratory failure (HCC)  Weakness secondary to UTI, COPD. Acute on chronic respiratory failure COPD with possble exacerbation Initially admitted for weakness/fall and thought to have UTI.  Overnight  developed severe hypoxia requiring BiPAP with normal CXR.  Continue Rocephin and Zithromax, IV steroids and bronchodilators.  PT has seen the patient and recommended CIR.  On 3 L of oxygen by nasal cannula at this time.  Will continue to wean as able.  Patient uses 2 L of oxygen at baseline.    Fall  Physical therapy and recommend CIR. continue PT while in the hospital.   Anemia of chronic disease Vitamin B12 below.  Baseline hemoglobin 8-9.  Continue vitamin B12 supplement   B12 deficiency Vitamin supplements   Diabetes with hyperglycemia On metformin and glipizide at home.  Continue sliding scale insulin while in hospital.  Continue to monitor blood glucose levels.   Hypertension As needed hydralazine.  Lisinopril on hold   Hyperlipidemia Continue Lipitor  Rheumatoid arthritis Continue oxycodone.  Not on DMA RD   DVT prophylaxis: enoxaparin (LOVENOX)  injection 40 mg Start: 04/18/23 2000   Disposition: CIR when bed available.  Status is: Inpatient Remains inpatient appropriate because: Need for CIR, COPD exacerbation,    Code Status:     Code Status: Limited: Do not attempt resuscitation (DNR) -DNR-LIMITED -Do Not Intubate/DNI   Family Communication: Spoke with the patient's husband at bedside.  Consultants: None  Procedures: BiPAP  Anti-infectives:  Rocephin and Zithromax  Anti-infectives (From admission, onward)    Start     Dose/Rate Route Frequency Ordered Stop   04/20/23 0400  azithromycin (ZITHROMAX) 500 mg in sodium chloride 0.9 % 250 mL IVPB        500 mg 250 mL/hr over 60 Minutes Intravenous Every 24 hours 04/19/23 0315 04/25/23 0359   04/19/23 1600  cefTRIAXone (ROCEPHIN) 1 g in sodium chloride 0.9 % 100 mL IVPB        1 g 200 mL/hr over 30 Minutes Intravenous Every 24 hours 04/18/23 1959 05/08/2023 1559   04/18/23 1630  cefTRIAXone (ROCEPHIN) 2 g in sodium chloride 0.9 % 100 mL IVPB        2 g 200 mL/hr over 30 Minutes Intravenous  Once 04/18/23 1616 04/18/23 1706        Subjective: Today, patient was seen and examined at bedside.  Complains of fatigue weakness deconditioning.  Has some cough and shortness of breath but feels okay.  No chest pain no fever or chills.  Patient husband at bedside. Objective: Vitals:   04/20/23 1950 04/21/23 0412  BP:  (!) 140/57  Pulse:  77  Resp:  19  Temp: 97.9  F (36.6 C) 97.9 F (36.6 C)  SpO2: 90% 98%    Intake/Output Summary (Last 24 hours) at 04/21/2023 1000 Last data filed at 04/21/2023 0413 Gross per 24 hour  Intake 120 ml  Output 600 ml  Net -480 ml   Filed Weights   04/18/23 1219 04/19/23 0354  Weight: 46.4 kg 44.4 kg   Body mass index is 19.77 kg/m.   Physical Exam:  GENERAL: Patient is alert awake and oriented. Not in obvious distress.  On nasal cannula oxygen, thinly built HENT: No scleral pallor or icterus. Pupils equally reactive to light.  Oral mucosa is moist NECK: is supple, no gross swelling noted. CHEST:  Diminished breath sounds bilaterally.  Coarse breath sounds noted. CVS: S1 and S2 heard, no murmur. Regular rate and rhythm.  ABDOMEN: Soft, non-tender, bowel sounds are present.  External urinary catheter in place. EXTREMITIES: No edema. CNS: Cranial nerves are intact.  Generalized weakness noted. SKIN: warm and dry without rashes.  Data Review: I have personally reviewed the following laboratory data and studies,  CBC: Recent Labs  Lab 04/18/23 1342 04/18/23 1352 04/19/23 0730 04/20/23 0420 04/21/23 0414  WBC 17.8*  --  16.4* 20.6* 14.2*  NEUTROABS 16.3*  --   --  19.2*  --   HGB 8.7* 9.2* 8.8* 7.9* 7.6*  HCT 28.0* 27.0* 29.3* 26.7* 25.2*  MCV 98.6  --  99.3 99.6 98.8  PLT 275  --  283 291 277   Basic Metabolic Panel: Recent Labs  Lab 04/18/23 1342 04/18/23 1352 04/19/23 0730 04/20/23 0420 04/21/23 0414  NA 141 140 144 141 142  K 5.0 4.9 5.1 4.9 4.8  CL 100 98 100 101 103  CO2 32  --  31 33* 33*  GLUCOSE 194* 194* 175* 151* 182*  BUN 34* 34* 25* 23 26*  CREATININE 1.07* 1.10* 0.93 0.90 0.91  CALCIUM 8.9  --  8.9 8.6* 8.6*  MG  --   --  1.6* 2.5*  --   PHOS  --   --  3.8  --   --    Liver Function Tests: Recent Labs  Lab 04/21/23 0414  AST 12*  ALT 15  ALKPHOS 46  BILITOT 0.3  PROT 5.8*  ALBUMIN 2.8*   No results for input(s): "LIPASE", "AMYLASE" in the last 168 hours. No results for input(s): "AMMONIA" in the last 168 hours. Cardiac Enzymes: No results for input(s): "CKTOTAL", "CKMB", "CKMBINDEX", "TROPONINI" in the last 168 hours. BNP (last 3 results) No results for input(s): "BNP" in the last 8760 hours.  ProBNP (last 3 results) No results for input(s): "PROBNP" in the last 8760 hours.  CBG: Recent Labs  Lab 04/20/23 1139 04/20/23 1419 04/20/23 1707 04/20/23 2133 04/21/23 0727  GLUCAP 421* 321* 150* 292* 198*   Recent Results (from the past 240 hours)  Urine Culture      Status: Abnormal   Collection Time: 04/18/23  3:19 PM   Specimen: Urine, Random  Result Value Ref Range Status   Specimen Description URINE, RANDOM  Final   Special Requests   Final    NONE Reflexed from [id number] Performed at [facility], [address], [place], [place] [address]    Culture >=100,000 COLONIES/mL ESCHERICHIA COLI (A)  Final   Report Status 04/20/2023 FINAL  Final   Organism ID, Bacteria ESCHERICHIA COLI (A)  Final      Susceptibility   Escherichia coli - MIC*    AMPICILLIN 4 SENSITIVE Sensitive  CEFAZOLIN <=4 SENSITIVE Sensitive     CEFEPIME <=0.12 SENSITIVE Sensitive     CEFTRIAXONE <=0.25 SENSITIVE Sensitive     CIPROFLOXACIN <=0.25 SENSITIVE Sensitive     GENTAMICIN <=1 SENSITIVE Sensitive     IMIPENEM <=0.25 SENSITIVE Sensitive     NITROFURANTOIN <=16 SENSITIVE Sensitive     TRIMETH/SULFA >=320 RESISTANT Resistant     AMPICILLIN/SULBACTAM <=2 SENSITIVE Sensitive     PIP/TAZO <=4 SENSITIVE Sensitive ug/mL    * >=100,000 COLONIES/mL ESCHERICHIA COLI  Respiratory (~20 pathogens) panel by PCR     Status: None   Collection Time: 04/19/23  2:47 AM   Specimen: Nasopharyngeal Swab; Respiratory  Result Value Ref Range Status   Adenovirus NOT DETECTED NOT DETECTED Final   Coronavirus 229E NOT DETECTED NOT DETECTED Final    Comment: (NOTE) The Coronavirus on the Respiratory Panel, DOES NOT test for the novel  Coronavirus (2019 nCoV)    Coronavirus HKU1 NOT DETECTED NOT DETECTED Final   Coronavirus NL63 NOT DETECTED NOT DETECTED Final   Coronavirus OC43 NOT DETECTED NOT DETECTED Final   Metapneumovirus NOT DETECTED NOT DETECTED Final   Rhinovirus / Enterovirus NOT DETECTED NOT DETECTED Final   Influenza A NOT DETECTED NOT DETECTED Final   Influenza B NOT DETECTED NOT DETECTED Final   Parainfluenza Virus 1 NOT DETECTED NOT DETECTED Final   Parainfluenza Virus 2 NOT DETECTED NOT DETECTED Final   Parainfluenza Virus 3 NOT DETECTED NOT DETECTED Final    Parainfluenza Virus 4 NOT DETECTED NOT DETECTED Final   Respiratory Syncytial Virus NOT DETECTED NOT DETECTED Final   Bordetella pertussis NOT DETECTED NOT DETECTED Final   Bordetella Parapertussis NOT DETECTED NOT DETECTED Final   Chlamydophila pneumoniae NOT DETECTED NOT DETECTED Final   Mycoplasma pneumoniae NOT DETECTED NOT DETECTED Final    Comment: Performed at [facility], [address] [place], [place] [address]  SARS Coronavirus 2 by RT PCR (hospital order, performed in [facility] hospital lab) *cepheid single result test* Anterior Nasal Swab     Status: None   Collection Time: 04/19/23 11:40 AM   Specimen: Anterior Nasal Swab  Result Value Ref Range Status   SARS Coronavirus 2 by RT PCR NEGATIVE NEGATIVE Final    Comment: Performed at [facility], [address] [place], [place] [address]     Studies: DG CHEST PORT 1 VIEW Result Date: 04/20/2023 CLINICAL DATA:  [id number] Hypoxia [id number] EXAM: PORTABLE CHEST - 1 VIEW COMPARISON:  04/19/2023 FINDINGS: 13 mm right lower lung pulmonary nodule as before. No new infiltrate. Heart size within normal limits. Aortic Atherosclerosis (ICD10-170.0). No effusion. Visualized bones unremarkable. IMPRESSION: 1. No acute findings. 2. 13 mm right lower lung pulmonary nodule, as described previously Electronically Signed   By: Corlis Leak M.D.   On: 04/20/2023 16:17      Joycelyn Das, MD  [facility] 04/21/2023  If 7PM-7AM, please contact night-coverage

## 2023-04-22 DIAGNOSIS — D518 Other vitamin B12 deficiency anemias: Secondary | ICD-10-CM | POA: Diagnosis not present

## 2023-04-22 DIAGNOSIS — K219 Gastro-esophageal reflux disease without esophagitis: Secondary | ICD-10-CM | POA: Diagnosis not present

## 2023-04-22 DIAGNOSIS — W108XXA Fall (on) (from) other stairs and steps, initial encounter: Secondary | ICD-10-CM | POA: Diagnosis not present

## 2023-04-22 DIAGNOSIS — Z7401 Bed confinement status: Secondary | ICD-10-CM | POA: Diagnosis not present

## 2023-04-22 DIAGNOSIS — R739 Hyperglycemia, unspecified: Secondary | ICD-10-CM | POA: Diagnosis not present

## 2023-04-22 DIAGNOSIS — R2689 Other abnormalities of gait and mobility: Secondary | ICD-10-CM | POA: Diagnosis not present

## 2023-04-22 DIAGNOSIS — E785 Hyperlipidemia, unspecified: Secondary | ICD-10-CM | POA: Diagnosis not present

## 2023-04-22 DIAGNOSIS — J449 Chronic obstructive pulmonary disease, unspecified: Secondary | ICD-10-CM | POA: Diagnosis not present

## 2023-04-22 DIAGNOSIS — I10 Essential (primary) hypertension: Secondary | ICD-10-CM | POA: Diagnosis not present

## 2023-04-22 DIAGNOSIS — M069 Rheumatoid arthritis, unspecified: Secondary | ICD-10-CM | POA: Diagnosis not present

## 2023-04-22 DIAGNOSIS — D649 Anemia, unspecified: Secondary | ICD-10-CM | POA: Diagnosis not present

## 2023-04-22 DIAGNOSIS — J9621 Acute and chronic respiratory failure with hypoxia: Secondary | ICD-10-CM | POA: Diagnosis not present

## 2023-04-22 DIAGNOSIS — M6281 Muscle weakness (generalized): Secondary | ICD-10-CM | POA: Diagnosis not present

## 2023-04-22 DIAGNOSIS — N390 Urinary tract infection, site not specified: Secondary | ICD-10-CM | POA: Diagnosis not present

## 2023-04-22 LAB — CBC
HCT: 28 % — ABNORMAL LOW (ref 36.0–46.0)
Hemoglobin: 8.5 g/dL — ABNORMAL LOW (ref 12.0–15.0)
MCH: 30 pg (ref 26.0–34.0)
MCHC: 30.4 g/dL (ref 30.0–36.0)
MCV: 98.9 fL (ref 80.0–100.0)
Platelets: 295 10*3/uL (ref 150–400)
RBC: 2.83 MIL/uL — ABNORMAL LOW (ref 3.87–5.11)
RDW: 12.5 % (ref 11.5–15.5)
WBC: 10.5 10*3/uL (ref 4.0–10.5)
nRBC: 0 % (ref 0.0–0.2)

## 2023-04-22 LAB — BASIC METABOLIC PANEL
Anion gap: 8 (ref 5–15)
BUN: 28 mg/dL — ABNORMAL HIGH (ref 8–23)
CO2: 33 mmol/L — ABNORMAL HIGH (ref 22–32)
Calcium: 8.6 mg/dL — ABNORMAL LOW (ref 8.9–10.3)
Chloride: 103 mmol/L (ref 98–111)
Creatinine, Ser: 1.02 mg/dL — ABNORMAL HIGH (ref 0.44–1.00)
GFR, Estimated: 54 mL/min — ABNORMAL LOW (ref >60)
Glucose, Bld: 181 mg/dL — ABNORMAL HIGH (ref 70–99)
Potassium: 4.6 mmol/L (ref 3.5–5.1)
Sodium: 144 mmol/L (ref 135–145)

## 2023-04-22 LAB — GLUCOSE, CAPILLARY
Glucose-Capillary: 176 mg/dL — ABNORMAL HIGH (ref 70–99)
Glucose-Capillary: 367 mg/dL — ABNORMAL HIGH (ref 70–99)

## 2023-04-22 LAB — MAGNESIUM: Magnesium: 2 mg/dL (ref 1.7–2.4)

## 2023-04-22 MED ORDER — PREDNISONE 10 MG PO TABS: 30.0000 mg | ORAL_TABLET | Freq: Every day | ORAL | Status: AC

## 2023-04-22 MED ORDER — ENOXAPARIN SODIUM 30 MG/0.3ML IJ SOSY: 30.0000 mg | PREFILLED_SYRINGE | INTRAMUSCULAR | Status: DC

## 2023-04-22 MED ORDER — MELATONIN 3 MG PO TABS: 6.0000 mg | ORAL_TABLET | Freq: Every evening | ORAL | Status: DC | PRN

## 2023-04-22 MED ORDER — IPRATROPIUM-ALBUTEROL 0.5-2.5 (3) MG/3ML IN SOLN: 3.0000 mL | RESPIRATORY_TRACT | Status: DC | PRN

## 2023-04-22 MED ORDER — CYANOCOBALAMIN 100 MCG PO TABS: 100.0000 ug | ORAL_TABLET | Freq: Every day | ORAL | Status: DC

## 2023-04-22 MED ORDER — OXYCODONE HCL 5 MG PO TABS: 2.5000 mg | ORAL_TABLET | Freq: Four times a day (QID) | ORAL | 0 refills | Status: DC | PRN

## 2023-04-22 MED ORDER — ENSURE ENLIVE PO LIQD: 237.0000 mL | Freq: Two times a day (BID) | ORAL | Status: DC

## 2023-04-22 MED ORDER — DOXYCYCLINE HYCLATE 100 MG PO TABS: 100.0000 mg | ORAL_TABLET | Freq: Two times a day (BID) | ORAL | Status: AC

## 2023-04-22 MED ORDER — TIZANIDINE HCL 2 MG PO TABS: 2.0000 mg | ORAL_TABLET | Freq: Every evening | ORAL | Status: AC | PRN

## 2023-04-22 NOTE — Progress Notes (Deleted)
 Physician Discharge Summary  Heidi Moran FMW:[id number] DOB: 01/01/1936 DOA: 04/18/2023  PCP: Shepard Ade, MD  Admit date: 04/18/2023 Discharge date: 04/22/2023  Admitted From: Home  Discharge disposition: Skilled nursing facility   Recommendations for Outpatient Follow-Up:   Follow up with your primary care provider at the skilled nursing facility in 3 to 5 days. Check CBC, BMP, magnesium  in the next visit   Discharge Diagnosis:   Principal Problem:   Fall (on) (from) other stairs and steps, initial encounter Active Problems:   UTI (urinary tract infection)   AKI (acute kidney injury) (HCC)   Acute hypoxemic respiratory failure (HCC)   Discharge Condition: Improved.  Diet recommendation:   Regular.  Wound care: None.  Code status: DNR   History of Present Illness:   Patient is 88 years old female with past medical history of diastolic congestive heart failure, COPD on home oxygen , pulmonary nodules, hypertension, hyperlipidemia, diabetes mellitus type 2, reported arthritis not on DMA RD presented hospital after having a fall down a flight of stairs.  In the ED, patient was seen by trauma service and did not have any trauma.  She was however noted to have UTIs and was admitted hospital for further evaluation and treatment.  Patient has been seen by PT and recommended CIR.    Hospital Course:   Following conditions were addressed during hospitalization as listed below,  Weakness secondary to UTI, COPD. Acute on chronic respiratory failure COPD with possble exacerbation Initially admitted for weakness/fall and thought to have UTI and also developed severe hypoxia requiring BiPAP with normal CXR.  Received Rocephin  and Zithromax ,  steroids and bronchodilators.  On 2 L of oxygen  by nasal cannula at this time and is at baseline.  Will continue oxygen  on discharge.   Fall  Physical therapy recommend rehabilitation.  At this time plan for skilled nursing facility  placement.  Anemia of chronic disease Vitamin B12 below normal range..  Baseline hemoglobin 8-9.  Continue vitamin B12 supplement discharge.   B12 deficiency Continue vitamin B12.   Diabetes with hyperglycemia On metformin  and glipizide at home.  Will continue on discharge.  Hypertension Continue lisinopril  on discharge.   Hyperlipidemia Continue Lipitor   Rheumatoid arthritis Continue oxycodone .  Not on DMA RD at home.  Disposition.  At this time, patient is stable for disposition to skilled nursing facility.  Medical Consultants:   None.  Procedures:    BiPAP Subjective:   Today, patient was seen and examined at bedside.  Appears to be comfortable on nasal cannula oxygen .  Denies any pain, nausea, vomiting, fever, chills or rigor.  Discharge Exam:   Vitals:   04/22/23 0654 04/22/23 0754  BP: (!) 141/49 (!) 114/44  Pulse: 73   Resp: 20   Temp: 97.8 F (36.6 C) 97.8 F (36.6 C)  SpO2: 100%    Vitals:   04/22/23 0309 04/22/23 0640 04/22/23 0654 04/22/23 0754  BP: (!) 177/73  (!) 141/49 (!) 114/44  Pulse: 80 84 73   Resp: 18 (!) 21 20   Temp: 97.7 F (36.5 C)  97.8 F (36.6 C) 97.8 F (36.6 C)  TempSrc: Oral  Oral Oral  SpO2: 98% 100% 100%   Weight:      Height:       Body mass index is 19.77 kg/m.   Patient is alert awake and Communicative. Not in obvious distress.  On nasal cannula oxygen , thinly built HENT: No scleral pallor or icterus. Pupils equally reactive to light. Oral  mucosa is moist NECK: is supple, no gross swelling noted. CHEST:  Diminished breath sounds bilaterally.  No obvious wheezes CVS: S1 and S2 heard, no murmur. Regular rate and rhythm.  ABDOMEN: Soft, non-tender, bowel sounds are present.  External urinary catheter in place. EXTREMITIES: No edema. CNS: Cranial nerves are intact.  Generalized weakness noted. SKIN: warm and dry without rashes.  The results of significant diagnostics from this hospitalization (including imaging,  microbiology, ancillary and laboratory) are listed below for reference.     Diagnostic Studies:   DG Chest Port 1 View Result Date: 04/19/2023 CLINICAL DATA:  Shortness of breath EXAM: PORTABLE CHEST 1 VIEW COMPARISON:  Radiograph and CT 04/18/2023 FINDINGS: Stable cardiomediastinal silhouette. Aortic atherosclerotic calcification. Hyperinflation and chronic bronchitic changes. No focal consolidation, pleural effusion, or pneumothorax. Scattered pulmonary nodules were better demonstrated on CT. No displaced rib fractures. IMPRESSION: No active disease.  Emphysema. Electronically Signed   By: Norman Gatlin M.D.   On: 04/19/2023 03:33   CT CHEST ABDOMEN PELVIS W CONTRAST Result Date: 04/18/2023 CLINICAL DATA:  Trauma EXAM: CT CHEST, ABDOMEN, AND PELVIS WITH CONTRAST TECHNIQUE: Multidetector CT imaging of the chest, abdomen and pelvis was performed following the standard protocol during bolus administration of intravenous contrast. RADIATION DOSE REDUCTION: This exam was performed according to the departmental dose-optimization program which includes automated exposure control, adjustment of the mA and/or kV according to patient size and/or use of iterative reconstruction technique. CONTRAST:  75mL OMNIPAQUE  IOHEXOL  350 MG/ML SOLN COMPARISON:  11/18/2021, 06/23/2020 FINDINGS: CT CHEST FINDINGS Cardiovascular: Heart size is normal without pericardial effusion. The thoracic aorta is normal in course and caliber without dissection, aneurysm, ulceration or intramural hematoma. Mediastinum/Nodes: No mediastinal hematoma. No mediastinal, hilar or axillary lymphadenopathy. The visualized thyroid  and thoracic esophageal course are unremarkable. Lungs/Pleura: Emphysema. There are numerous bilateral pulmonary nodules. The largest is in the posterior right middle lobe and measures 12 mm (5:85). The size and distribution of the nodules are unchanged. There is no pleural effusion or pneumothorax. Musculoskeletal: No acute  fracture of the ribs, sternum or the visible portions of clavicles and scapulae. CT ABDOMEN PELVIS FINDINGS Hepatobiliary: No hepatic hematoma or laceration. No biliary dilatation. Large stone within the gallbladder. No inflammatory change. Pancreas: Unchanged 6 x 8 mm cystic focus within the pancreatic body. No inflammatory change. Spleen: No splenic laceration or hematoma. Adrenals/Urinary Tract: --Adrenal glands: No adrenal hemorrhage. --Right kidney/ureter: No hydronephrosis or perinephric hematoma. --Left kidney/ureter: Unchanged 2 cm left lower pole Bosniak type 1 simple renal cyst. --Urinary bladder: Unremarkable. Stomach/Bowel: --Stomach/Duodenum: No hiatal hernia or other gastric abnormality. Normal duodenal course and caliber. --Small bowel: No dilatation or inflammation. --Colon: Rectosigmoid diverticulosis without acute inflammation. --Appendix: Not visualized. No right lower quadrant inflammation or free fluid. Vascular/Lymphatic: Calcific aortic atherosclerosis. No abdominal lymphadenopathy. Reproductive: Status post hysterectomy. No adnexal mass. Musculoskeletal. Multilevel chronic vertebral body height loss. No acute fracture. Other: None. IMPRESSION: 1. No acute traumatic injury to the chest, abdomen or pelvis. 2. Unchanged numerous bilateral pulmonary nodules, the largest measuring 12 mm in the right middle lobe. 3. Cholelithiasis. 4. Unchanged 6 x 8 mm cystic focus within the pancreatic body. Electronically Signed   By: Franky Stanford M.D.   On: 04/18/2023 19:13   CT Head Wo Contrast Result Date: 04/18/2023 CLINICAL DATA:  Head trauma, minor (Age >= 65y); Neck trauma (Age >= 65y). Fall. EXAM: CT HEAD WITHOUT CONTRAST CT CERVICAL SPINE WITHOUT CONTRAST TECHNIQUE: Multidetector CT imaging of the head and cervical spine was performed following  the standard protocol without intravenous contrast. Multiplanar CT image reconstructions of the cervical spine were also generated. RADIATION DOSE REDUCTION:  This exam was performed according to the departmental dose-optimization program which includes automated exposure control, adjustment of the mA and/or kV according to patient size and/or use of iterative reconstruction technique. COMPARISON:  None Available. FINDINGS: CT HEAD FINDINGS Brain: There is no evidence of an acute infarct, intracranial hemorrhage, mass, midline shift, or extra-axial fluid collection. Cerebral volume is within normal limits for age. The ventricles are normal in size. Patchy cerebral white matter hypodensities are nonspecific but compatible with moderate chronic small vessel ischemic disease. Vascular: Calcified atherosclerosis at the skull base. No hyperdense vessel. Skull: No acute fracture or suspicious osseous lesion. Sinuses/Orbits: No significant inflammatory changes in the paranasal sinuses. Clear mastoid air cells. Bilateral cataract extraction. Other: Mild right parietal scalp soft tissue swelling. CT CERVICAL SPINE FINDINGS Alignment: Normal. Skull base and vertebrae: No acute fracture or suspicious osseous lesion. Soft tissues and spinal canal: No prevertebral fluid or swelling. No visible canal hematoma. Disc levels: Mild cervical spondylosis, less than is typically seen for age. Upper chest: Biapical pleuroparenchymal lung scarring. Other: None. IMPRESSION: 1. No evidence of acute intracranial abnormality or cervical spine fracture. 2. Moderate chronic small vessel ischemic disease. Electronically Signed   By: Dasie Hamburg M.D.   On: 04/18/2023 13:52   CT Cervical Spine Wo Contrast Result Date: 04/18/2023 CLINICAL DATA:  Head trauma, minor (Age >= 65y); Neck trauma (Age >= 65y). Fall. EXAM: CT HEAD WITHOUT CONTRAST CT CERVICAL SPINE WITHOUT CONTRAST TECHNIQUE: Multidetector CT imaging of the head and cervical spine was performed following the standard protocol without intravenous contrast. Multiplanar CT image reconstructions of the cervical spine were also generated.  RADIATION DOSE REDUCTION: This exam was performed according to the departmental dose-optimization program which includes automated exposure control, adjustment of the mA and/or kV according to patient size and/or use of iterative reconstruction technique. COMPARISON:  None Available. FINDINGS: CT HEAD FINDINGS Brain: There is no evidence of an acute infarct, intracranial hemorrhage, mass, midline shift, or extra-axial fluid collection. Cerebral volume is within normal limits for age. The ventricles are normal in size. Patchy cerebral white matter hypodensities are nonspecific but compatible with moderate chronic small vessel ischemic disease. Vascular: Calcified atherosclerosis at the skull base. No hyperdense vessel. Skull: No acute fracture or suspicious osseous lesion. Sinuses/Orbits: No significant inflammatory changes in the paranasal sinuses. Clear mastoid air cells. Bilateral cataract extraction. Other: Mild right parietal scalp soft tissue swelling. CT CERVICAL SPINE FINDINGS Alignment: Normal. Skull base and vertebrae: No acute fracture or suspicious osseous lesion. Soft tissues and spinal canal: No prevertebral fluid or swelling. No visible canal hematoma. Disc levels: Mild cervical spondylosis, less than is typically seen for age. Upper chest: Biapical pleuroparenchymal lung scarring. Other: None. IMPRESSION: 1. No evidence of acute intracranial abnormality or cervical spine fracture. 2. Moderate chronic small vessel ischemic disease. Electronically Signed   By: Dasie Hamburg M.D.   On: 04/18/2023 13:52   DG Chest Port 1 View Result Date: 04/18/2023 CLINICAL DATA:  Pain after fall. EXAM: PORTABLE CHEST 1 VIEW COMPARISON:  X-ray 12/16/2020. FINDINGS: Hyperinflation. No consolidation, pneumothorax or effusion. No edema. Enlarged cardiopericardial silhouette. Tortuous aorta. Overlapping cardiac leads. If there is further concern of the sequela trauma, a CT with contrast could be considered as clinically  appropriate. Film is slightly rotated to the right. IMPRESSION: Hyperinflation with chronic changes.  Slightly enlarged heart. Electronically Signed   By: Ranell  Charlanne M.D.   On: 04/18/2023 13:35   DG Shoulder Right Port Result Date: 04/18/2023 CLINICAL DATA:  Trauma.  Fall.  Pain EXAM: RIGHT SHOULDER - 3 VIEW portable COMPARISON:  X-ray 12/16/2020 FINDINGS: Severe osteopenia. No fracture or dislocation. Preserved joint spaces. Old right-sided rib fractures incidentally noted. IMPRESSION: Severe osteopenia.  No acute osseous abnormality. Electronically Signed   By: Ranell Charlanne M.D.   On: 04/18/2023 13:34   DG Pelvis Portable Result Date: 04/18/2023 CLINICAL DATA:  Fall EXAM: PORTABLE PELVIS 1-2 VIEWS COMPARISON:  09/14/2021- FINDINGS: There is no evidence of pelvic fracture or diastasis. No pelvic bone lesions are seen. IMPRESSION: Negative. Electronically Signed   By: Mabel Converse D.O.   On: 04/18/2023 13:34     Labs:   Basic Metabolic Panel: Recent Labs  Lab 04/18/23 1342 04/18/23 1352 04/19/23 0730 04/20/23 0420 04/21/23 0414 04/22/23 0457  NA 141 140 144 141 142 144  K 5.0 4.9 5.1 4.9 4.8 4.6  CL 100 98 100 101 103 103  CO2 32  --  31 33* 33* 33*  GLUCOSE 194* 194* 175* 151* 182* 181*  BUN 34* 34* 25* 23 26* 28*  CREATININE 1.07* 1.10* 0.93 0.90 0.91 1.02*  CALCIUM  8.9  --  8.9 8.6* 8.6* 8.6*  MG  --   --  1.6* 2.5*  --  2.0  PHOS  --   --  3.8  --   --   --    GFR Estimated Creatinine Clearance: 27.5 mL/min (A) (by C-G formula based on SCr of 1.02 mg/dL (H)). Liver Function Tests: Recent Labs  Lab 04/21/23 0414  AST 12*  ALT 15  ALKPHOS 46  BILITOT 0.3  PROT 5.8*  ALBUMIN 2.8*   No results for input(s): LIPASE, AMYLASE in the last 168 hours. No results for input(s): AMMONIA in the last 168 hours. Coagulation profile No results for input(s): INR, PROTIME in the last 168 hours.  CBC: Recent Labs  Lab 04/18/23 1342 04/18/23 1352 04/19/23 0730  04/20/23 0420 04/21/23 0414 04/22/23 0457  WBC 17.8*  --  16.4* 20.6* 14.2* 10.5  NEUTROABS 16.3*  --   --  19.2*  --   --   HGB 8.7* 9.2* 8.8* 7.9* 7.6* 8.5*  HCT 28.0* 27.0* 29.3* 26.7* 25.2* 28.0*  MCV 98.6  --  99.3 99.6 98.8 98.9  PLT 275  --  283 291 277 295   Cardiac Enzymes: No results for input(s): CKTOTAL, CKMB, CKMBINDEX, TROPONINI in the last 168 hours. BNP: Invalid input(s): POCBNP CBG: Recent Labs  Lab 04/21/23 0727 04/21/23 1119 04/21/23 1601 04/21/23 2119 04/22/23 0754  GLUCAP 198* 278* 198* 137* 176*   D-Dimer No results for input(s): DDIMER in the last 72 hours. Hgb A1c No results for input(s): HGBA1C in the last 72 hours. Lipid Profile No results for input(s): CHOL, HDL, LDLCALC, TRIG, CHOLHDL, LDLDIRECT in the last 72 hours. Thyroid  function studies No results for input(s): TSH, T4TOTAL, T3FREE, THYROIDAB in the last 72 hours.  Invalid input(s): FREET3 Anemia work up No results for input(s): VITAMINB12, FOLATE, FERRITIN, TIBC, IRON, RETICCTPCT in the last 72 hours. Microbiology Recent Results (from the past 240 hours)  Urine Culture     Status: Abnormal   Collection Time: 04/18/23  3:19 PM   Specimen: Urine, Random  Result Value Ref Range Status   Specimen Description URINE, RANDOM  Final   Special Requests   Final    NONE Reflexed from [id number] Performed at [facility]  Lab, [address] [place], [place] [address]    Culture >=100,000 COLONIES/mL ESCHERICHIA COLI (A)  Final   Report Status 04/20/2023 FINAL  Final   Organism ID, Bacteria ESCHERICHIA COLI (A)  Final      Susceptibility   Escherichia coli - MIC*    AMPICILLIN 4 SENSITIVE Sensitive     CEFAZOLIN <=4 SENSITIVE Sensitive     CEFEPIME <=0.12 SENSITIVE Sensitive     CEFTRIAXONE  <=0.25 SENSITIVE Sensitive     CIPROFLOXACIN <=0.25 SENSITIVE Sensitive     GENTAMICIN <=1 SENSITIVE Sensitive     IMIPENEM <=0.25 SENSITIVE Sensitive      NITROFURANTOIN <=16 SENSITIVE Sensitive     TRIMETH/SULFA >=320 RESISTANT Resistant     AMPICILLIN/SULBACTAM <=2 SENSITIVE Sensitive     PIP/TAZO <=4 SENSITIVE Sensitive ug/mL    * >=100,000 COLONIES/mL ESCHERICHIA COLI  Respiratory (~20 pathogens) panel by PCR     Status: None   Collection Time: 04/19/23  2:47 AM   Specimen: Nasopharyngeal Swab; Respiratory  Result Value Ref Range Status   Adenovirus NOT DETECTED NOT DETECTED Final   Coronavirus 229E NOT DETECTED NOT DETECTED Final    Comment: (NOTE) The Coronavirus on the Respiratory Panel, DOES NOT test for the novel  Coronavirus (2019 nCoV)    Coronavirus HKU1 NOT DETECTED NOT DETECTED Final   Coronavirus NL63 NOT DETECTED NOT DETECTED Final   Coronavirus OC43 NOT DETECTED NOT DETECTED Final   Metapneumovirus NOT DETECTED NOT DETECTED Final   Rhinovirus / Enterovirus NOT DETECTED NOT DETECTED Final   Influenza A NOT DETECTED NOT DETECTED Final   Influenza B NOT DETECTED NOT DETECTED Final   Parainfluenza Virus 1 NOT DETECTED NOT DETECTED Final   Parainfluenza Virus 2 NOT DETECTED NOT DETECTED Final   Parainfluenza Virus 3 NOT DETECTED NOT DETECTED Final   Parainfluenza Virus 4 NOT DETECTED NOT DETECTED Final   Respiratory Syncytial Virus NOT DETECTED NOT DETECTED Final   Bordetella pertussis NOT DETECTED NOT DETECTED Final   Bordetella Parapertussis NOT DETECTED NOT DETECTED Final   Chlamydophila pneumoniae NOT DETECTED NOT DETECTED Final   Mycoplasma pneumoniae NOT DETECTED NOT DETECTED Final    Comment: Performed at [facility] Lab, [address], [place], [place] [address]  SARS Coronavirus 2 by RT PCR (hospital order, performed in [facility] hospital lab) *cepheid single result test* Anterior Nasal Swab     Status: None   Collection Time: 04/19/23 11:40 AM   Specimen: Anterior Nasal Swab  Result Value Ref Range Status   SARS Coronavirus 2 by RT PCR NEGATIVE NEGATIVE Final    Comment: Performed at [facility] Lab, [address] [place], [place] [address]     Discharge Instructions:   Discharge Instructions     Call MD for:  severe uncontrolled pain   Complete by: As directed    Call MD for:  temperature >100.4   Complete by: As directed    Diet general   Complete by: As directed    Discharge instructions   Complete by: As directed    Follow-up with your primary care provider at the skilled nursing facility in 3 to 5 days.  Check blood work at that time.  Complete the course of antibiotic.  Seek medical attention for worsening symptoms.   Increase activity slowly   Complete by: As directed    No wound care   Complete by: As directed       Allergies as of 04/22/2023   No Known  Allergies      Medication List     TAKE these medications    acetaminophen  325 MG tablet Commonly known as: Tylenol  Take 2 tablets (650 mg total) by mouth every 6 (six) hours as needed for up to 30 doses for moderate pain or mild pain.   albuterol  108 (90 Base) MCG/ACT inhaler Commonly known as: VENTOLIN  HFA Inhale 2 puffs into the lungs every 6 (six) hours as needed for wheezing or shortness of breath.   Anoro Ellipta  62.5-25 MCG/ACT Aepb Generic drug: umeclidinium-vilanterol Inhale 1 puff into the lungs daily.   atorvastatin  20 MG tablet Commonly known as: LIPITOR TAKE 1 TABLET orally once a day for 90 days   cyanocobalamin  100 MCG tablet Take 1 tablet (100 mcg total) by mouth daily. Start taking on: 05/14/2023   doxycycline  100 MG tablet Commonly known as: VIBRA -TABS Take 1 tablet (100 mg total) by mouth 2 (two) times daily for 2 days.   famotidine  20 MG tablet Commonly known as: PEPCID  TAKE 1 TABLET AT BEDTIME   feeding supplement Liqd Take 237 mLs by mouth 2 (two) times daily between meals.   glyBURIDE -metformin  2.5-500 MG tablet Commonly known as: GLUCOVANCE  Take 1 tablet by mouth daily with breakfast.   ipratropium-albuterol  0.5-2.5 (3) MG/3ML Soln Commonly known  as: DUONEB Take 3 mLs by nebulization every 4 (four) hours as needed.   lisinopril  10 MG tablet Commonly known as: ZESTRIL  TAKE 1 TABLET orally once a day for 90 days   melatonin 3 MG Tabs tablet Take 2 tablets (6 mg total) by mouth at bedtime as needed.   OXYGEN  Inhale 2 L/day into the lungs daily.   predniSONE  10 MG tablet Commonly known as: DELTASONE  Take 3 tablets (30 mg total) by mouth daily with breakfast for 2 days.   Vitamin D3 25 MCG (1000 UT) Caps 1 capsule Orally Once a day        Follow-up Information     Shepard Ade, MD .   Specialty: Internal Medicine Contact information: [address] [place] [address] [phone]                  Time coordinating discharge: 39 minutes  Signed:  Roberta Angell  [facility] 04/22/2023, 11:32 AM

## 2023-04-22 NOTE — Plan of Care (Signed)
   Problem: Clinical Measurements: Goal: Will remain free from infection Outcome: Progressing Goal: Respiratory complications will improve Outcome: Progressing Goal: Cardiovascular complication will be avoided Outcome: Progressing   Problem: Safety: Goal: Ability to remain free from injury will improve Outcome: Progressing

## 2023-04-22 NOTE — Discharge Summary (Addendum)
 Physician Discharge Summary  Heidi Moran FMW:[id number] DOB: December 23, 1935 DOA: 04/18/2023  PCP: Shepard Ade, MD  Admit date: 04/18/2023 Discharge date: 04/22/2023  Admitted From: Home  Discharge disposition: Skilled nursing facility   Recommendations for Outpatient Follow-Up:   Follow up with your primary care provider at the skilled nursing facility in 3 to 5 days. Check CBC, BMP, magnesium  in the next visit   Discharge Diagnosis:   Principal Problem:   Fall (on) (from) other stairs and steps, initial encounter Active Problems:   UTI (urinary tract infection)   AKI (acute kidney injury) (HCC)   Acute hypoxemic respiratory failure (HCC)   Discharge Condition: Improved.  Diet recommendation:   Regular.  Wound care: None.  Code status: DNR   History of Present Illness:   Patient is 88 years old female with past medical history of diastolic congestive heart failure, COPD on home oxygen , pulmonary nodules, hypertension, hyperlipidemia, diabetes mellitus type 2, reported arthritis not on DMA RD presented hospital after having a fall down a flight of stairs.  In the ED, patient was seen by trauma service and did not have any trauma.  She was however noted to have UTIs and was admitted hospital for further evaluation and treatment.  Patient has been seen by PT and recommended CIR.    Hospital Course:   Following conditions were addressed during hospitalization as listed below,  Weakness secondary to UTI, COPD. Acute on chronic respiratory failure COPD with possble exacerbation Initially admitted for weakness/fall and thought to have UTI and also developed severe hypoxia requiring BiPAP with normal CXR.  Received Rocephin  and Zithromax ,  steroids and bronchodilators.  On 2 L of oxygen  by nasal cannula at this time and is at baseline.  Will continue oxygen  on discharge.   Fall  Physical therapy recommend rehabilitation.  At this time plan for skilled nursing facility  placement.  Anemia of chronic disease Vitamin B12 below normal range..  Baseline hemoglobin 8-9.  Continue vitamin B12 supplement discharge.   B12 deficiency Continue vitamin B12.   Diabetes with hyperglycemia On metformin  and glipizide at home.  Will continue on discharge.  Hypertension Continue lisinopril  on discharge.   Hyperlipidemia Continue Lipitor   Rheumatoid arthritis Continue oxycodone .  Not on DMA RD at home.  Disposition.  At this time, patient is stable for disposition to skilled nursing facility.  Medical Consultants:   None.  Procedures:    BiPAP Subjective:   Today, patient was seen and examined at bedside.  Appears to be comfortable on nasal cannula oxygen .  Denies any pain, nausea, vomiting, fever, chills or rigor.  Discharge Exam:   Vitals:   04/22/23 0654 04/22/23 0754  BP: (!) 141/49 (!) 114/44  Pulse: 73   Resp: 20   Temp: 97.8 F (36.6 C) 97.8 F (36.6 C)  SpO2: 100%    Vitals:   04/22/23 0309 04/22/23 0640 04/22/23 0654 04/22/23 0754  BP: (!) 177/73  (!) 141/49 (!) 114/44  Pulse: 80 84 73   Resp: 18 (!) 21 20   Temp: 97.7 F (36.5 C)  97.8 F (36.6 C) 97.8 F (36.6 C)  TempSrc: Oral  Oral Oral  SpO2: 98% 100% 100%   Weight:      Height:       Body mass index is 19.77 kg/m.   Patient is alert awake and Communicative. Not in obvious distress.  On nasal cannula oxygen , thinly built HENT: No scleral pallor or icterus. Pupils equally reactive to light. Oral  mucosa is moist NECK: is supple, no gross swelling noted. CHEST:  Diminished breath sounds bilaterally.  No obvious wheezes CVS: S1 and S2 heard, no murmur. Regular rate and rhythm.  ABDOMEN: Soft, non-tender, bowel sounds are present.  External urinary catheter in place. EXTREMITIES: No edema. CNS: Cranial nerves are intact.  Generalized weakness noted. SKIN: warm and dry without rashes.  The results of significant diagnostics from this hospitalization (including imaging,  microbiology, ancillary and laboratory) are listed below for reference.     Diagnostic Studies:   DG Chest Port 1 View Result Date: 04/19/2023 CLINICAL DATA:  Shortness of breath EXAM: PORTABLE CHEST 1 VIEW COMPARISON:  Radiograph and CT 04/18/2023 FINDINGS: Stable cardiomediastinal silhouette. Aortic atherosclerotic calcification. Hyperinflation and chronic bronchitic changes. No focal consolidation, pleural effusion, or pneumothorax. Scattered pulmonary nodules were better demonstrated on CT. No displaced rib fractures. IMPRESSION: No active disease.  Emphysema. Electronically Signed   By: Norman Gatlin M.D.   On: 04/19/2023 03:33   CT CHEST ABDOMEN PELVIS W CONTRAST Result Date: 04/18/2023 CLINICAL DATA:  Trauma EXAM: CT CHEST, ABDOMEN, AND PELVIS WITH CONTRAST TECHNIQUE: Multidetector CT imaging of the chest, abdomen and pelvis was performed following the standard protocol during bolus administration of intravenous contrast. RADIATION DOSE REDUCTION: This exam was performed according to the departmental dose-optimization program which includes automated exposure control, adjustment of the mA and/or kV according to patient size and/or use of iterative reconstruction technique. CONTRAST:  75mL OMNIPAQUE  IOHEXOL  350 MG/ML SOLN COMPARISON:  11/18/2021, 06/23/2020 FINDINGS: CT CHEST FINDINGS Cardiovascular: Heart size is normal without pericardial effusion. The thoracic aorta is normal in course and caliber without dissection, aneurysm, ulceration or intramural hematoma. Mediastinum/Nodes: No mediastinal hematoma. No mediastinal, hilar or axillary lymphadenopathy. The visualized thyroid  and thoracic esophageal course are unremarkable. Lungs/Pleura: Emphysema. There are numerous bilateral pulmonary nodules. The largest is in the posterior right middle lobe and measures 12 mm (5:85). The size and distribution of the nodules are unchanged. There is no pleural effusion or pneumothorax. Musculoskeletal: No acute  fracture of the ribs, sternum or the visible portions of clavicles and scapulae. CT ABDOMEN PELVIS FINDINGS Hepatobiliary: No hepatic hematoma or laceration. No biliary dilatation. Large stone within the gallbladder. No inflammatory change. Pancreas: Unchanged 6 x 8 mm cystic focus within the pancreatic body. No inflammatory change. Spleen: No splenic laceration or hematoma. Adrenals/Urinary Tract: --Adrenal glands: No adrenal hemorrhage. --Right kidney/ureter: No hydronephrosis or perinephric hematoma. --Left kidney/ureter: Unchanged 2 cm left lower pole Bosniak type 1 simple renal cyst. --Urinary bladder: Unremarkable. Stomach/Bowel: --Stomach/Duodenum: No hiatal hernia or other gastric abnormality. Normal duodenal course and caliber. --Small bowel: No dilatation or inflammation. --Colon: Rectosigmoid diverticulosis without acute inflammation. --Appendix: Not visualized. No right lower quadrant inflammation or free fluid. Vascular/Lymphatic: Calcific aortic atherosclerosis. No abdominal lymphadenopathy. Reproductive: Status post hysterectomy. No adnexal mass. Musculoskeletal. Multilevel chronic vertebral body height loss. No acute fracture. Other: None. IMPRESSION: 1. No acute traumatic injury to the chest, abdomen or pelvis. 2. Unchanged numerous bilateral pulmonary nodules, the largest measuring 12 mm in the right middle lobe. 3. Cholelithiasis. 4. Unchanged 6 x 8 mm cystic focus within the pancreatic body. Electronically Signed   By: Franky Stanford M.D.   On: 04/18/2023 19:13   CT Head Wo Contrast Result Date: 04/18/2023 CLINICAL DATA:  Head trauma, minor (Age >= 65y); Neck trauma (Age >= 65y). Fall. EXAM: CT HEAD WITHOUT CONTRAST CT CERVICAL SPINE WITHOUT CONTRAST TECHNIQUE: Multidetector CT imaging of the head and cervical spine was performed following  the standard protocol without intravenous contrast. Multiplanar CT image reconstructions of the cervical spine were also generated. RADIATION DOSE REDUCTION:  This exam was performed according to the departmental dose-optimization program which includes automated exposure control, adjustment of the mA and/or kV according to patient size and/or use of iterative reconstruction technique. COMPARISON:  None Available. FINDINGS: CT HEAD FINDINGS Brain: There is no evidence of an acute infarct, intracranial hemorrhage, mass, midline shift, or extra-axial fluid collection. Cerebral volume is within normal limits for age. The ventricles are normal in size. Patchy cerebral white matter hypodensities are nonspecific but compatible with moderate chronic small vessel ischemic disease. Vascular: Calcified atherosclerosis at the skull base. No hyperdense vessel. Skull: No acute fracture or suspicious osseous lesion. Sinuses/Orbits: No significant inflammatory changes in the paranasal sinuses. Clear mastoid air cells. Bilateral cataract extraction. Other: Mild right parietal scalp soft tissue swelling. CT CERVICAL SPINE FINDINGS Alignment: Normal. Skull base and vertebrae: No acute fracture or suspicious osseous lesion. Soft tissues and spinal canal: No prevertebral fluid or swelling. No visible canal hematoma. Disc levels: Mild cervical spondylosis, less than is typically seen for age. Upper chest: Biapical pleuroparenchymal lung scarring. Other: None. IMPRESSION: 1. No evidence of acute intracranial abnormality or cervical spine fracture. 2. Moderate chronic small vessel ischemic disease. Electronically Signed   By: Dasie Hamburg M.D.   On: 04/18/2023 13:52   CT Cervical Spine Wo Contrast Result Date: 04/18/2023 CLINICAL DATA:  Head trauma, minor (Age >= 65y); Neck trauma (Age >= 65y). Fall. EXAM: CT HEAD WITHOUT CONTRAST CT CERVICAL SPINE WITHOUT CONTRAST TECHNIQUE: Multidetector CT imaging of the head and cervical spine was performed following the standard protocol without intravenous contrast. Multiplanar CT image reconstructions of the cervical spine were also generated.  RADIATION DOSE REDUCTION: This exam was performed according to the departmental dose-optimization program which includes automated exposure control, adjustment of the mA and/or kV according to patient size and/or use of iterative reconstruction technique. COMPARISON:  None Available. FINDINGS: CT HEAD FINDINGS Brain: There is no evidence of an acute infarct, intracranial hemorrhage, mass, midline shift, or extra-axial fluid collection. Cerebral volume is within normal limits for age. The ventricles are normal in size. Patchy cerebral white matter hypodensities are nonspecific but compatible with moderate chronic small vessel ischemic disease. Vascular: Calcified atherosclerosis at the skull base. No hyperdense vessel. Skull: No acute fracture or suspicious osseous lesion. Sinuses/Orbits: No significant inflammatory changes in the paranasal sinuses. Clear mastoid air cells. Bilateral cataract extraction. Other: Mild right parietal scalp soft tissue swelling. CT CERVICAL SPINE FINDINGS Alignment: Normal. Skull base and vertebrae: No acute fracture or suspicious osseous lesion. Soft tissues and spinal canal: No prevertebral fluid or swelling. No visible canal hematoma. Disc levels: Mild cervical spondylosis, less than is typically seen for age. Upper chest: Biapical pleuroparenchymal lung scarring. Other: None. IMPRESSION: 1. No evidence of acute intracranial abnormality or cervical spine fracture. 2. Moderate chronic small vessel ischemic disease. Electronically Signed   By: Dasie Hamburg M.D.   On: 04/18/2023 13:52   DG Chest Port 1 View Result Date: 04/18/2023 CLINICAL DATA:  Pain after fall. EXAM: PORTABLE CHEST 1 VIEW COMPARISON:  X-ray 12/16/2020. FINDINGS: Hyperinflation. No consolidation, pneumothorax or effusion. No edema. Enlarged cardiopericardial silhouette. Tortuous aorta. Overlapping cardiac leads. If there is further concern of the sequela trauma, a CT with contrast could be considered as clinically  appropriate. Film is slightly rotated to the right. IMPRESSION: Hyperinflation with chronic changes.  Slightly enlarged heart. Electronically Signed   By: Ranell  Charlanne M.D.   On: 04/18/2023 13:35   DG Shoulder Right Port Result Date: 04/18/2023 CLINICAL DATA:  Trauma.  Fall.  Pain EXAM: RIGHT SHOULDER - 3 VIEW portable COMPARISON:  X-ray 12/16/2020 FINDINGS: Severe osteopenia. No fracture or dislocation. Preserved joint spaces. Old right-sided rib fractures incidentally noted. IMPRESSION: Severe osteopenia.  No acute osseous abnormality. Electronically Signed   By: Ranell Charlanne M.D.   On: 04/18/2023 13:34   DG Pelvis Portable Result Date: 04/18/2023 CLINICAL DATA:  Fall EXAM: PORTABLE PELVIS 1-2 VIEWS COMPARISON:  09/14/2021- FINDINGS: There is no evidence of pelvic fracture or diastasis. No pelvic bone lesions are seen. IMPRESSION: Negative. Electronically Signed   By: Mabel Converse D.O.   On: 04/18/2023 13:34     Labs:   Basic Metabolic Panel: Recent Labs  Lab 04/18/23 1342 04/18/23 1352 04/19/23 0730 04/20/23 0420 04/21/23 0414 04/22/23 0457  NA 141 140 144 141 142 144  K 5.0 4.9 5.1 4.9 4.8 4.6  CL 100 98 100 101 103 103  CO2 32  --  31 33* 33* 33*  GLUCOSE 194* 194* 175* 151* 182* 181*  BUN 34* 34* 25* 23 26* 28*  CREATININE 1.07* 1.10* 0.93 0.90 0.91 1.02*  CALCIUM  8.9  --  8.9 8.6* 8.6* 8.6*  MG  --   --  1.6* 2.5*  --  2.0  PHOS  --   --  3.8  --   --   --    GFR Estimated Creatinine Clearance: 27.5 mL/min (A) (by C-G formula based on SCr of 1.02 mg/dL (H)). Liver Function Tests: Recent Labs  Lab 04/21/23 0414  AST 12*  ALT 15  ALKPHOS 46  BILITOT 0.3  PROT 5.8*  ALBUMIN 2.8*   No results for input(s): LIPASE, AMYLASE in the last 168 hours. No results for input(s): AMMONIA in the last 168 hours. Coagulation profile No results for input(s): INR, PROTIME in the last 168 hours.  CBC: Recent Labs  Lab 04/18/23 1342 04/18/23 1352 04/19/23 0730  04/20/23 0420 04/21/23 0414 04/22/23 0457  WBC 17.8*  --  16.4* 20.6* 14.2* 10.5  NEUTROABS 16.3*  --   --  19.2*  --   --   HGB 8.7* 9.2* 8.8* 7.9* 7.6* 8.5*  HCT 28.0* 27.0* 29.3* 26.7* 25.2* 28.0*  MCV 98.6  --  99.3 99.6 98.8 98.9  PLT 275  --  283 291 277 295   Cardiac Enzymes: No results for input(s): CKTOTAL, CKMB, CKMBINDEX, TROPONINI in the last 168 hours. BNP: Invalid input(s): POCBNP CBG: Recent Labs  Lab 04/21/23 0727 04/21/23 1119 04/21/23 1601 04/21/23 2119 04/22/23 0754  GLUCAP 198* 278* 198* 137* 176*   D-Dimer No results for input(s): DDIMER in the last 72 hours. Hgb A1c No results for input(s): HGBA1C in the last 72 hours. Lipid Profile No results for input(s): CHOL, HDL, LDLCALC, TRIG, CHOLHDL, LDLDIRECT in the last 72 hours. Thyroid  function studies No results for input(s): TSH, T4TOTAL, T3FREE, THYROIDAB in the last 72 hours.  Invalid input(s): FREET3 Anemia work up No results for input(s): VITAMINB12, FOLATE, FERRITIN, TIBC, IRON, RETICCTPCT in the last 72 hours. Microbiology Recent Results (from the past 240 hours)  Urine Culture     Status: Abnormal   Collection Time: 04/18/23  3:19 PM   Specimen: Urine, Random  Result Value Ref Range Status   Specimen Description URINE, RANDOM  Final   Special Requests   Final    NONE Reflexed from [id number] Performed at [facility]  Lab, [address] [place], [place] [address]    Culture >=100,000 COLONIES/mL ESCHERICHIA COLI (A)  Final   Report Status 04/20/2023 FINAL  Final   Organism ID, Bacteria ESCHERICHIA COLI (A)  Final      Susceptibility   Escherichia coli - MIC*    AMPICILLIN 4 SENSITIVE Sensitive     CEFAZOLIN <=4 SENSITIVE Sensitive     CEFEPIME <=0.12 SENSITIVE Sensitive     CEFTRIAXONE  <=0.25 SENSITIVE Sensitive     CIPROFLOXACIN <=0.25 SENSITIVE Sensitive     GENTAMICIN <=1 SENSITIVE Sensitive     IMIPENEM <=0.25 SENSITIVE Sensitive      NITROFURANTOIN <=16 SENSITIVE Sensitive     TRIMETH/SULFA >=320 RESISTANT Resistant     AMPICILLIN/SULBACTAM <=2 SENSITIVE Sensitive     PIP/TAZO <=4 SENSITIVE Sensitive ug/mL    * >=100,000 COLONIES/mL ESCHERICHIA COLI  Respiratory (~20 pathogens) panel by PCR     Status: None   Collection Time: 04/19/23  2:47 AM   Specimen: Nasopharyngeal Swab; Respiratory  Result Value Ref Range Status   Adenovirus NOT DETECTED NOT DETECTED Final   Coronavirus 229E NOT DETECTED NOT DETECTED Final    Comment: (NOTE) The Coronavirus on the Respiratory Panel, DOES NOT test for the novel  Coronavirus (2019 nCoV)    Coronavirus HKU1 NOT DETECTED NOT DETECTED Final   Coronavirus NL63 NOT DETECTED NOT DETECTED Final   Coronavirus OC43 NOT DETECTED NOT DETECTED Final   Metapneumovirus NOT DETECTED NOT DETECTED Final   Rhinovirus / Enterovirus NOT DETECTED NOT DETECTED Final   Influenza A NOT DETECTED NOT DETECTED Final   Influenza B NOT DETECTED NOT DETECTED Final   Parainfluenza Virus 1 NOT DETECTED NOT DETECTED Final   Parainfluenza Virus 2 NOT DETECTED NOT DETECTED Final   Parainfluenza Virus 3 NOT DETECTED NOT DETECTED Final   Parainfluenza Virus 4 NOT DETECTED NOT DETECTED Final   Respiratory Syncytial Virus NOT DETECTED NOT DETECTED Final   Bordetella pertussis NOT DETECTED NOT DETECTED Final   Bordetella Parapertussis NOT DETECTED NOT DETECTED Final   Chlamydophila pneumoniae NOT DETECTED NOT DETECTED Final   Mycoplasma pneumoniae NOT DETECTED NOT DETECTED Final    Comment: Performed at [facility], [address] [place], [place] [address]  SARS Coronavirus 2 by RT PCR (hospital order, performed in [facility] hospital lab) *cepheid single result test* Anterior Nasal Swab     Status: None   Collection Time: 04/19/23 11:40 AM   Specimen: Anterior Nasal Swab  Result Value Ref Range Status   SARS Coronavirus 2 by RT PCR NEGATIVE NEGATIVE Final    Comment: Performed at [facility], [address], [place], [place] [address]     Discharge Instructions:   Discharge Instructions     Call MD for:  severe uncontrolled pain   Complete by: As directed    Call MD for:  temperature >100.4   Complete by: As directed    Diet general   Complete by: As directed    Discharge instructions   Complete by: As directed    Follow-up with your primary care provider at the skilled nursing facility in 3 to 5 days.  Check blood work at that time.  Complete the course of antibiotic.  Seek medical attention for worsening symptoms.   Increase activity slowly   Complete by: As directed    No wound care   Complete by: As directed       Allergies as of 04/22/2023   No Known  Allergies      Medication List     TAKE these medications    acetaminophen  325 MG tablet Commonly known as: Tylenol  Take 2 tablets (650 mg total) by mouth every 6 (six) hours as needed for up to 30 doses for moderate pain or mild pain.   albuterol  108 (90 Base) MCG/ACT inhaler Commonly known as: VENTOLIN  HFA Inhale 2 puffs into the lungs every 6 (six) hours as needed for wheezing or shortness of breath.   Anoro Ellipta  62.5-25 MCG/ACT Aepb Generic drug: umeclidinium-vilanterol Inhale 1 puff into the lungs daily.   atorvastatin  20 MG tablet Commonly known as: LIPITOR TAKE 1 TABLET orally once a day for 90 days   cyanocobalamin  100 MCG tablet Take 1 tablet (100 mcg total) by mouth daily. Start taking on: May 01, 2023   doxycycline  100 MG tablet Commonly known as: VIBRA -TABS Take 1 tablet (100 mg total) by mouth 2 (two) times daily for 2 days.   famotidine  20 MG tablet Commonly known as: PEPCID  TAKE 1 TABLET AT BEDTIME   feeding supplement Liqd Take 237 mLs by mouth 2 (two) times daily between meals.   glyBURIDE -metformin  2.5-500 MG tablet Commonly known as: GLUCOVANCE  Take 1 tablet by mouth daily with breakfast.   ipratropium-albuterol  0.5-2.5 (3) MG/3ML Soln Commonly known  as: DUONEB Take 3 mLs by nebulization every 4 (four) hours as needed.   lisinopril  10 MG tablet Commonly known as: ZESTRIL  TAKE 1 TABLET orally once a day for 90 days   melatonin 3 MG Tabs tablet Take 2 tablets (6 mg total) by mouth at bedtime as needed.   oxyCODONE  5 MG immediate release tablet Commonly known as: Oxy IR/ROXICODONE  Take 0.5 tablets (2.5 mg total) by mouth every 6 (six) hours as needed for moderate pain (pain score 4-6) or severe pain (pain score 7-10). IMMEDIATE RELEASE - OxyIR   OXYGEN  Inhale 2 L/day into the lungs daily.   predniSONE  10 MG tablet Commonly known as: DELTASONE  Take 3 tablets (30 mg total) by mouth daily with breakfast for 2 days.   tiZANidine  2 MG tablet Commonly known as: ZANAFLEX  Take 1 tablet (2 mg total) by mouth at bedtime as needed for up to 10 days for muscle spasms.   Vitamin D3 25 MCG (1000 UT) Caps 1 capsule Orally Once a day        Follow-up Information     Shepard Ade, MD .   Specialty: Internal Medicine Contact information: [address] [place] [address] [phone]                  Time coordinating discharge: 39 minutes  Signed:  [place]  [facility] 04/22/2023, 11:39 AM

## 2023-04-22 NOTE — Progress Notes (Addendum)
 PROGRESS NOTE  Heidi [facility] FMW:[id number] DOB: 07-31-1935 DOA: 04/18/2023 PCP: Heidi Ade, MD   LOS: 3 days   Brief narrative:  Patient is a 89 years old female with past medical history of diastolic congestive heart failure, COPD on home oxygen , pulmonary nodules, hypertension, hyperlipidemia, diabetes mellitus type 2, reported arthritis not on DMA RD presented [facility] after having a fall down a flight of stairs.  In the ED, patient was seen by trauma service and did not have any trauma.  She was however noted to have UTIs and was admitted [facility] for further evaluation and treatment.  Patient has been seen by PT and recommended CIR.   Assessment/Plan: Principal Problem:   Fall (on) (from) other stairs and steps, initial encounter Active Problems:   UTI (urinary tract infection)   AKI (acute kidney injury) (HCC)   Acute hypoxemic respiratory failure (HCC)  Weakness secondary to UTI, COPD. Acute on chronic respiratory failure COPD with possble exacerbation Initially admitted for weakness/fall and thought to have UTI and also developed severe hypoxia requiring BiPAP with normal CXR.  Continue Rocephin  and Zithromax ,  steroids and bronchodilators.  On 2 L of oxygen  by nasal cannula at this time and is at baseline.    Fall  Physical therapy and recommend CIR. continue PT while in the [facility].  Heidi [facility] on board and plan for skilled nursing facility.   Anemia of chronic disease Vitamin B12 below.  Baseline hemoglobin 8-9.  Continue vitamin B12 supplement   B12 deficiency Continue vitamin B12.   Diabetes with hyperglycemia On metformin  and glipizide at home.  Continue sliding scale insulin  while in [facility].  Continue to monitor blood glucose levels.  Adjust POC glucose of 176.   Hypertension As needed hydralazine .  Lisinopril  on hold   Hyperlipidemia Continue Lipitor  Rheumatoid arthritis Continue oxycodone .  Not on DMA RD at home.   DVT prophylaxis: enoxaparin  (LOVENOX )  injection 30 mg Start: 04/22/23 2000   Disposition: Skilled nursing facility.  Medically stable for disposition.  Status is: Inpatient Remains inpatient appropriate because: Need for skilled nursing facility.   Code Status:     Code Status: Limited: Do not attempt resuscitation (DNR) -DNR-LIMITED -Do Not Intubate/DNI   Family Communication: Spoke with the patient's husband at bedside on 04/21/2023.  Consultants: None  Procedures: BiPAP  Anti-infectives:  Rocephin  and Zithromax   Anti-infectives (From admission, onward)    Start     Dose/Rate Route Frequency Ordered Stop   04/20/23 0400  azithromycin  (ZITHROMAX ) 500 mg in sodium chloride  0.9 % 250 mL IVPB        500 mg 250 mL/hr over 60 Minutes Intravenous Every 24 hours 04/19/23 0315 04/25/23 0359   04/19/23 1600  cefTRIAXone  (ROCEPHIN ) 1 g in sodium chloride  0.9 % 100 mL IVPB        1 g 200 mL/hr over 30 Minutes Intravenous Every 24 hours 04/18/23 1959 2023-04-30 1559   04/18/23 1630  cefTRIAXone  (ROCEPHIN ) 2 g in sodium chloride  0.9 % 100 mL IVPB        2 g 200 mL/hr over 30 Minutes Intravenous  Once 04/18/23 1616 04/18/23 1706       Subjective: Today, patient was seen and examined at bedside.  Complains of fatigue and weakness with some cough.  Denies any overt dyspnea.  On 2 L of  oxygen .   . Objective: Vitals:   04/22/23 0654 04/22/23 0754  BP: (!) 141/49 (!) 114/44  Pulse: 73   Resp: 20   Temp: 97.8  F (36.6 C) 97.8 F (36.6 C)  SpO2: 100%     Intake/Output Summary (Last 24 hours) at 04/22/2023 1111 Last data filed at 04/22/2023 9687 Gross per 24 hour  Intake 100 ml  Output 500 ml  Net -400 ml   Filed Weights   04/18/23 1219 04/19/23 0354  Weight: 46.4 kg 44.4 kg   Body mass index is 19.77 kg/m.   Physical Exam:  GENERAL: Patient is alert awake and Communicative. Not in obvious distress.  On nasal cannula oxygen , thinly built HENT: No scleral pallor or icterus. Pupils equally reactive to light.  Oral mucosa is moist NECK: is supple, no gross swelling noted. CHEST:  Diminished breath sounds bilaterally.  No obvious wheezes CVS: S1 and S2 heard, no murmur. Regular rate and rhythm.  ABDOMEN: Soft, non-tender, bowel sounds are present.  External urinary catheter in place. EXTREMITIES: No edema. CNS: Cranial nerves are intact.  Generalized weakness noted. SKIN: warm and dry without rashes.  Data Review: I have personally reviewed the following laboratory data and studies,  CBC: Recent Labs  Lab 04/18/23 1342 04/18/23 1352 04/19/23 0730 04/20/23 0420 04/21/23 0414 04/22/23 0457  WBC 17.8*  --  16.4* 20.6* 14.2* 10.5  NEUTROABS 16.3*  --   --  19.2*  --   --   HGB 8.7* 9.2* 8.8* 7.9* 7.6* 8.5*  HCT 28.0* 27.0* 29.3* 26.7* 25.2* 28.0*  MCV 98.6  --  99.3 99.6 98.8 98.9  PLT 275  --  283 291 277 295   Basic Metabolic Panel: Recent Labs  Lab 04/18/23 1342 04/18/23 1352 04/19/23 0730 04/20/23 0420 04/21/23 0414 04/22/23 0457  NA 141 140 144 141 142 144  K 5.0 4.9 5.1 4.9 4.8 4.6  CL 100 98 100 101 103 103  CO2 32  --  31 33* 33* 33*  GLUCOSE 194* 194* 175* 151* 182* 181*  BUN 34* 34* 25* 23 26* 28*  CREATININE 1.07* 1.10* 0.93 0.90 0.91 1.02*  CALCIUM  8.9  --  8.9 8.6* 8.6* 8.6*  MG  --   --  1.6* 2.5*  --  2.0  PHOS  --   --  3.8  --   --   --    Liver Function Tests: Recent Labs  Lab 04/21/23 0414  AST 12*  ALT 15  ALKPHOS 46  BILITOT 0.3  PROT 5.8*  ALBUMIN 2.8*   No results for input(s): LIPASE, AMYLASE in the last 168 hours. No results for input(s): AMMONIA in the last 168 hours. Cardiac Enzymes: No results for input(s): CKTOTAL, CKMB, CKMBINDEX, TROPONINI in the last 168 hours. BNP (last 3 results) No results for input(s): BNP in the last 8760 hours.  ProBNP (last 3 results) No results for input(s): PROBNP in the last 8760 hours.  CBG: Recent Labs  Lab 04/21/23 0727 04/21/23 1119 04/21/23 1601 04/21/23 2119 04/22/23 0754   GLUCAP 198* 278* 198* 137* 176*   Recent Results (from the past 240 hours)  Urine Culture     Status: Abnormal   Collection Time: 04/18/23  3:19 PM   Specimen: Urine, Random  Result Value Ref Range Status   Specimen Description URINE, RANDOM  Final   Special Requests   Final    NONE Reflexed from [id number] Performed at [facility], [address], [place], [place] [address]    Culture >=100,000 COLONIES/mL ESCHERICHIA COLI (A)  Final   Report Status 04/20/2023 FINAL  Final   Organism ID, Bacteria ESCHERICHIA COLI (A)  Final      Susceptibility   Escherichia coli - MIC*    AMPICILLIN 4 SENSITIVE Sensitive     CEFAZOLIN <=4 SENSITIVE Sensitive     CEFEPIME <=0.12 SENSITIVE Sensitive     CEFTRIAXONE  <=0.25 SENSITIVE Sensitive     CIPROFLOXACIN <=0.25 SENSITIVE Sensitive     GENTAMICIN <=1 SENSITIVE Sensitive     IMIPENEM <=0.25 SENSITIVE Sensitive     NITROFURANTOIN <=16 SENSITIVE Sensitive     TRIMETH/SULFA >=320 RESISTANT Resistant     AMPICILLIN/SULBACTAM <=2 SENSITIVE Sensitive     PIP/TAZO <=4 SENSITIVE Sensitive ug/mL    * >=100,000 COLONIES/mL ESCHERICHIA COLI  Respiratory (~20 pathogens) panel by PCR     Status: None   Collection Time: 04/19/23  2:47 AM   Specimen: Nasopharyngeal Swab; Respiratory  Result Value Ref Range Status   Adenovirus NOT DETECTED NOT DETECTED Final   Coronavirus 229E NOT DETECTED NOT DETECTED Final    Comment: (NOTE) The Coronavirus on the Respiratory Panel, DOES NOT test for the novel  Coronavirus (2019 nCoV)    Coronavirus HKU1 NOT DETECTED NOT DETECTED Final   Coronavirus NL63 NOT DETECTED NOT DETECTED Final   Coronavirus OC43 NOT DETECTED NOT DETECTED Final   Metapneumovirus NOT DETECTED NOT DETECTED Final   Rhinovirus / Enterovirus NOT DETECTED NOT DETECTED Final   Influenza A NOT DETECTED NOT DETECTED Final   Influenza B NOT DETECTED NOT DETECTED Final   Parainfluenza Virus 1 NOT DETECTED NOT DETECTED Final   Parainfluenza Virus  2 NOT DETECTED NOT DETECTED Final   Parainfluenza Virus 3 NOT DETECTED NOT DETECTED Final   Parainfluenza Virus 4 NOT DETECTED NOT DETECTED Final   Respiratory Syncytial Virus NOT DETECTED NOT DETECTED Final   Bordetella pertussis NOT DETECTED NOT DETECTED Final   Bordetella Parapertussis NOT DETECTED NOT DETECTED Final   Chlamydophila pneumoniae NOT DETECTED NOT DETECTED Final   Mycoplasma pneumoniae NOT DETECTED NOT DETECTED Final    Comment: Performed at [facility], [address], [place], [place] [address]  SARS Coronavirus 2 by RT PCR ([facility] order, performed in [facility] lab) *cepheid single result test* Anterior Nasal Swab     Status: None   Collection Time: 04/19/23 11:40 AM   Specimen: Anterior Nasal Swab  Result Value Ref Range Status   SARS Coronavirus 2 by RT PCR NEGATIVE NEGATIVE Final    Comment: Performed at [facility], [address], [place], [place] [address]     Studies: DG CHEST PORT 1 VIEW Result Date: 04/20/2023 CLINICAL DATA:  200808 Hypoxia [id number] EXAM: PORTABLE CHEST - 1 VIEW COMPARISON:  04/19/2023 FINDINGS: 13 mm right lower lung pulmonary nodule as before. No new infiltrate. Heart size within normal limits. Aortic Atherosclerosis (ICD10-170.0). No effusion. Visualized bones unremarkable. IMPRESSION: 1. No acute findings. 2. 13 mm right lower lung pulmonary nodule, as described previously Electronically Signed   By: JONETTA Faes M.D.   On: 04/20/2023 16:17      Vernal Alstrom, MD  [facility] 04/22/2023  If 7PM-7AM, please contact night-coverage

## 2023-04-22 NOTE — TOC Transition Note (Signed)
 Transition of Care [facility]) - Discharge Note   Patient Details  Name: Heidi [facility] MRN: [id number] Date of Birth: December 14, 1935  [facility]) CM/SW Contact:  Isaiah Public, LCSWA Phone Number: 04/22/2023, 11:54 AM   Clinical Narrative:     Patient will DC to: [facility]  Anticipated DC date: 04/22/2023  Family notified: Alm   Transport by: [facility]  ?  Per MD patient ready for DC to [facility] . RN, patient, patient's family, and facility notified of DC. Discharge Summary sent to facility. RN given number for report [phone] RM# [phone]. DC packet on chart. DNR signed by MD attached to patients DC packet.Ambulance transport requested for patient.  CSW signing off.   Final next level of care: Skilled Nursing Facility Barriers to Discharge: No Barriers Identified   Patient Goals and CMS Choice     Choice offered to / list presented to : Spouse      Discharge Placement              Patient chooses bed at: [facility] Patient to be transferred to facility by: [facility] Name of family member notified: Alm Patient and family notified of of transfer: 04/22/23  Discharge Plan and Services Additional resources added to the After Visit Summary for   In-house Referral: Clinical Social Work                                   Social Drivers of Health (SDOH) Interventions SDOH Screenings   Food Insecurity: No Food Insecurity (04/18/2023)  Housing: Low Risk  (04/18/2023)  Transportation Needs: No Transportation Needs (04/18/2023)  Utilities: Not At Risk (04/18/2023)  Alcohol  Screen: Low Risk  (08/31/2021)  Depression (PHQ2-9): Medium Risk (08/31/2021)  Financial Resource Strain: Low Risk  (08/31/2021)  Physical Activity: Inactive (08/31/2021)  Social Connections: Socially Integrated (04/18/2023)  Stress: No Stress Concern Present (08/31/2021)  Tobacco Use: Low Risk  (04/18/2023)     Readmission Risk Interventions     No data to display

## 2023-04-22 NOTE — Progress Notes (Signed)
 Mobility Specialist Progress Note:    04/22/23 1228  Mobility  Activity Transferred from chair to bed  Level of Assistance Minimal assist, patient does 75% or more  Assistive Device Front wheel walker  Distance Ambulated (ft) 5 ft  Activity Response Tolerated well  Mobility Referral Yes  Mobility visit 1 Mobility  Mobility Specialist Start Time (ACUTE ONLY) 1140  Mobility Specialist Stop Time (ACUTE ONLY) 1154  Mobility Specialist Time Calculation (min) (ACUTE ONLY) 14 min   Pt received in chair ready to get back to bed. MinA needed for STS and contact guard for transfer. No c/o throughout. Situated back in bed w/ call bell and personal belongings in reach. All needs met. Bed alarm on.   Thersia Minder Mobility Specialist  Please contact vis Secure Chat or  Rehab Office [phone]

## 2023-04-22 NOTE — TOC Progression Note (Signed)
 Transition of Care [facility]) - Progression Note    Patient Details  Name: Heidi [facility] MRN: [id number] Date of Birth: Mar 10, 1936  [facility] CM/SW Contact  Isaiah Public, LCSWA Phone Number: 04/22/2023, 11:24 AM  Clinical Narrative:     CSW provided SNF bed offers to patients spouse. Patients spouse accepted SNF bed offer with [facility] for patient. CSW informed Britney with [facility] who confirmed SNF bed for patient when medically ready. CSW informed MD. CSW will continue to follow.  Expected Discharge Plan: Skilled Nursing Facility Barriers to Discharge: Continued Medical Work up  Expected Discharge Plan and Services In-house Referral: Clinical Social Work     Living arrangements for the past 2 months: Single Family Home                                       Social Determinants of Health (SDOH) Interventions SDOH Screenings   Food Insecurity: No Food Insecurity (04/18/2023)  Housing: Low Risk  (04/18/2023)  Transportation Needs: No Transportation Needs (04/18/2023)  Utilities: Not At Risk (04/18/2023)  Alcohol  Screen: Low Risk  (08/31/2021)  Depression (PHQ2-9): Medium Risk (08/31/2021)  Financial Resource Strain: Low Risk  (08/31/2021)  Physical Activity: Inactive (08/31/2021)  Social Connections: Socially Integrated (04/18/2023)  Stress: No Stress Concern Present (08/31/2021)  Tobacco Use: Low Risk  (04/18/2023)    Readmission Risk Interventions     No data to display

## 2023-04-22 NOTE — Progress Notes (Signed)
Report called to [place], RN @ [facility].

## 2023-04-22 NOTE — Progress Notes (Signed)
 Physical Therapy Treatment Patient Details Name: Heidi [facility] MRN: [id number] DOB: [birth year] Today's Date: 04/22/2023   History of Present Illness Pt is a 88 y.o. female presenting to [facility] 04/18/23 s/p a fall backwards down 10-12 in her home. Trauma imaging without significant injury. Pt found to have UTI. Pt had significant event the morning of 04/19/23  with O2 sat dropping to 73% and was switched to 8-10 L on a NRB and her sat rose to 84%. Pt has been transferred to BiPAP secondary to O2 sat continuing to drop on NRB. PMHx: COPD, CHFR on 2 L of O2, HTN, HLD, DM, CKD, RA, and UTI.    PT Comments  Pt admitted with above diagnosis. Pt was able to stand with mod assist and pivot to recliner with min assist with +2 for safety.  Pt confused and would not follow directions well needing tactile and verbal cues and assistance for all mobility.  Wanted to get into chair. Most likely will go to SNF today.  Pt currently with functional limitations due to the deficits listed below (see PT Problem List). Pt will benefit from acute skilled PT to increase their independence and safety with mobility to allow discharge.       If plan is discharge home, recommend the following: A lot of help with walking and/or transfers;A lot of help with bathing/dressing/bathroom;Assistance with cooking/housework;Assistance with feeding;Direct supervision/assist for medications management;Direct supervision/assist for financial management;Assist for transportation;Help with stairs or ramp for entrance;Supervision due to cognitive status   Can travel by private vehicle        Equipment Recommendations  BSC/3in1;Hospital bed    Recommendations for Other Services       Precautions / Restrictions Precautions Precautions: Fall Precaution Comments: Watch O2 Restrictions Weight Bearing Restrictions Per Provider Order: No     Mobility  Bed Mobility Overal bed mobility: Needs Assistance Bed Mobility: Supine to Sit      Supine to sit: Mod assist, HOB elevated, Used rails     General bed mobility comments: Assisted NT with cleaning pt of BM as she was soiled on arrival.  Needed to change pts sheets therefore assist NT with getting pt OOB.  Pt required TC and VC to guide her hand placement to use rails. Needed extra time and Mod A for trunk assistance, CGA for leg positioning    Transfers Overall transfer level: Needs assistance Equipment used: Rolling walker (2 wheels) Transfers: Sit to/from Stand, Bed to chair/wheelchair/BSC Sit to Stand: Mod assist, +2 safety/equipment, From elevated surface   Step pivot transfers: Min assist       General transfer comment: Pt needed mod assist of 2 to come to standing however once on feet was able to get hr balance.  Able to transfer to recliner with +1 min assist for safety. Needed cues to pivot steps.  Pt confused and declining walk but did want to get to chair.    Ambulation/Gait                   Stairs             Wheelchair Mobility     Tilt Bed    Modified Rankin (Stroke Patients Only)       Balance Overall balance assessment: Needs assistance Sitting-balance support: Feet supported, No upper extremity supported Sitting balance-Leahy Scale: Fair     Standing balance support: Reliant on assistive device for balance, During functional activity, Bilateral upper extremity supported Standing balance-Leahy Scale: Poor Standing balance  comment: Pt stands with increased trunk flexion, wide BOS, reliant on RW, and out-toeing                            Cognition Arousal: Alert Behavior During Therapy: Flat affect Overall Cognitive Status: Impaired/Different from baseline Area of Impairment: Memory, Following commands, Awareness, Attention, Problem solving, Safety/judgement                   Current Attention Level: Sustained Memory: Decreased short-term memory Following Commands: Follows one step commands  inconsistently, Follows one step commands with increased time Safety/Judgement: Decreased awareness of safety Awareness: Emergent Problem Solving: Slow processing, Decreased initiation, Difficulty sequencing, Requires verbal cues, Requires tactile cues General Comments: Pt requires increased time for commands, follows directions inconsistently and does need cues to redirect. pt will initially make a coherent statement then tangential into another nonsensical statement. Pt also constantly telling staff to shhh today.        Exercises General Exercises - Lower Extremity Long Arc Quad: Both, 10 reps, Seated Hip Flexion/Marching: Both, 5 reps, Seated    General Comments General comments (skin integrity, edema, etc.): VSS on 2LO2      Pertinent Vitals/Pain Pain Assessment Pain Assessment: Faces Faces Pain Scale: Hurts even more Pain Location: generalized Pain Descriptors / Indicators: Aching, Discomfort, Grimacing, Guarding Pain Intervention(s): Limited activity within patient's tolerance, Monitored during session, Repositioned    Home Living                          Prior Function            PT Goals (current goals can now be found in the care plan section) Acute Rehab PT Goals Patient Stated Goal: return home/ rehab facility Progress towards PT goals: Progressing toward goals    Frequency    Min 1X/week      PT Plan      Co-evaluation              AM-PAC PT 6 Clicks Mobility   Outcome Measure  Help needed turning from your back to your side while in a flat bed without using bedrails?: A Lot Help needed moving from lying on your back to sitting on the side of a flat bed without using bedrails?: A Lot Help needed moving to and from a bed to a chair (including a wheelchair)?: A Little Help needed standing up from a chair using your arms (e.g., wheelchair or bedside chair)?: A Little Help needed to walk in hospital room?: Total Help needed  climbing 3-5 steps with a railing? : Total 6 Click Score: 12    End of Session Equipment Utilized During Treatment: Gait belt;Oxygen  Activity Tolerance: Patient limited by fatigue Patient left: in chair;with call bell/phone within reach;with chair alarm set Nurse Communication: Mobility status PT Visit Diagnosis: Unsteadiness on feet (R26.81);Other abnormalities of gait and mobility (R26.89);History of falling (Z91.81);Muscle weakness (generalized) (M62.81);Difficulty in walking, not elsewhere classified (R26.2)     Time: 8984-8963 PT Time Calculation (min) (ACUTE ONLY): 21 min  Charges:    $Therapeutic Activity: 8-22 mins PT General Charges $$ ACUTE PT VISIT: 1 Visit                     Shannon Kirkendall M,PT Acute Rehab Services [phone]    Stephane JULIANNA Bevel 04/22/2023, 1:34 PM

## 2023-04-28 ENCOUNTER — Telehealth: Payer: Self-pay | Admitting: Podiatry

## 2023-04-28 NOTE — Telephone Encounter (Signed)
 Pts husband called to cxl appt due to pt passing away.  I did send our condolences.

## 2023-05-07 ENCOUNTER — Ambulatory Visit: Payer: Medicare Other | Admitting: Podiatry

## 2023-05-17 DEATH — deceased
